# Patient Record
Sex: Male | Born: 1990
Health system: Southern US, Community
[De-identification: ages and names within clinical notes are randomized; demographics above are authoritative.]

## PROBLEM LIST (undated history)

## (undated) DIAGNOSIS — J45909 Unspecified asthma, uncomplicated: Secondary | ICD-10-CM

## (undated) DIAGNOSIS — R4586 Emotional lability: Secondary | ICD-10-CM

## (undated) DIAGNOSIS — L309 Dermatitis, unspecified: Secondary | ICD-10-CM

---
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
## 2016-06-21 ENCOUNTER — Encounter (HOSPITAL_COMMUNITY): Payer: Self-pay | Admitting: *Deleted

## 2016-06-21 ENCOUNTER — Emergency Department (HOSPITAL_COMMUNITY): Payer: Self-pay

## 2016-06-21 ENCOUNTER — Emergency Department (HOSPITAL_COMMUNITY)
Admission: EM | Admit: 2016-06-21 | Discharge: 2016-06-21 | Disposition: A | Discharge status: Home / Self Care | Payer: Self-pay | Attending: Emergency Medicine | Admitting: Emergency Medicine

## 2016-06-21 DIAGNOSIS — J45901 Unspecified asthma with (acute) exacerbation: Secondary | ICD-10-CM | POA: Insufficient documentation

## 2016-06-21 DIAGNOSIS — B9789 Other viral agents as the cause of diseases classified elsewhere: Secondary | ICD-10-CM

## 2016-06-21 DIAGNOSIS — J069 Acute upper respiratory infection, unspecified: Secondary | ICD-10-CM

## 2016-06-21 DIAGNOSIS — F17200 Nicotine dependence, unspecified, uncomplicated: Secondary | ICD-10-CM | POA: Insufficient documentation

## 2016-06-21 HISTORY — DX: Unspecified asthma, uncomplicated: J45.909

## 2016-06-21 MED ORDER — PREDNISONE 20 MG PO TABS
60.0000 mg | ORAL_TABLET | Freq: Once | ORAL | Status: AC
  Administered 2016-06-21: 60 mg via ORAL
  Filled 2016-06-21: qty 3

## 2016-06-21 MED ORDER — PREDNISONE 10 MG PO TABS: 60.0000 mg | ORAL_TABLET | Freq: Every day | ORAL | 0 refills | Status: AC

## 2016-06-21 MED ORDER — ALBUTEROL SULFATE HFA 108 (90 BASE) MCG/ACT IN AERS
4.0000 | INHALATION_SPRAY | Freq: Once | RESPIRATORY_TRACT | Status: AC
  Administered 2016-06-21: 4 via RESPIRATORY_TRACT
  Filled 2016-06-21: qty 13.4

## 2016-06-21 NOTE — ED Notes (Signed)
To x-ray

## 2016-06-21 NOTE — ED Provider Notes (Signed)
[facility] Provider Note   CSN: [id number] Arrival date & time: 06/21/16  1706     History   Chief Complaint Chief Complaint  Patient presents with  . Nasal Congestion    HPI Larry Simmons is a 26 y.o. male.  HPI  26 year old previously healthy male with history of asthma as a child who presents with cough and nasal congestion. Patient states that the symptoms have been occurring for the last 2 days. States he has a cough productive of yellow sputum. Denies fevers or chills. States he works in a [employer], and when he was at work today over the [employer] he felt lightheaded and had to sit down. Also endorses some wheezing, with occasional shortness of breath. He has not felt lightheaded at all since that time at work. Endorses a tightness in his chest but denies chest pain. No nausea, vomiting, or diarrhea. He has not used an inhaler "in years." Denies sore throat or rhinorrhea.   Past Medical History:  Diagnosis Date  . Asthma     There are no active problems to display for this patient.   History reviewed. No pertinent surgical history.     Home Medications    Prior to Admission medications   Medication Sig Start Date End Date Taking? Authorizing Provider  predniSONE (DELTASONE) 10 MG tablet Take 6 tablets (60 mg total) by mouth daily. 06/21/16 06/25/16  Margretta Zamorano Ernestina Penna, MD    Family History No family history on file.  Social History Social History  Substance Use Topics  . Smoking status: Current Every Day Smoker  . Smokeless tobacco: Current User  . Alcohol use Not on file     Allergies   Iodinated diagnostic agents   Review of Systems Review of Systems  Constitutional: Negative for chills and fever.  HENT: Positive for congestion. Negative for rhinorrhea and sore throat.   Eyes: Negative for visual disturbance.  Respiratory: Positive for cough, chest tightness, shortness of breath and wheezing.   Cardiovascular: Negative for chest pain and  palpitations.  Gastrointestinal: Negative for abdominal pain, diarrhea, nausea and vomiting.  Genitourinary: Negative for dysuria and frequency.  Musculoskeletal: Negative for arthralgias, back pain, myalgias, neck pain and neck stiffness.  Skin: Negative for color change and rash.  Neurological: Negative for dizziness, syncope and headaches.  Psychiatric/Behavioral: Negative for agitation, behavioral problems and confusion.     Physical Exam Updated Vital Signs BP 114/69   Pulse 66   Temp 97.8 F (36.6 C) (Oral)   Resp 16   Ht  (1.88 m)   Wt 72.6 kg   SpO2 96%   BMI 20.54 kg/m   Physical Exam  Constitutional: He is oriented to person, place, and time. He appears well-developed and well-nourished. No distress.  HENT:  Head: Normocephalic and atraumatic.  Nose: Nose normal.  Mouth/Throat: Oropharynx is clear and moist. No oropharyngeal exudate.  Eyes: Conjunctivae are normal.  Neck: Normal range of motion. Neck supple.  Cardiovascular: Normal rate, regular rhythm, normal heart sounds and intact distal pulses.   No murmur heard. Pulmonary/Chest: Effort normal. No respiratory distress. Wheezes: soft diffuse end-expiratory wheezing. He has no rales.  Abdominal: Soft. Bowel sounds are normal. He exhibits no distension. There is no tenderness. There is no guarding.  Musculoskeletal: He exhibits no edema.  Neurological: He is alert and oriented to person, place, and time. He exhibits normal muscle tone.  Skin: Skin is warm and dry. He is not diaphoretic.  Psychiatric: He has a  normal mood and affect.  Nursing note and vitals reviewed.    ED Treatments / Results  Labs (all labs ordered are listed, but only abnormal results are displayed) Labs Reviewed - No data to display  EKG  EKG Interpretation  Date/Time:  Sunday June 21 2016 19:43:30 EDT Ventricular Rate:  68 PR Interval:  130 QRS Duration: 88 QT Interval:  390 QTC Calculation: 414 R Axis:   72 Text  Interpretation:  Normal sinus rhythm RSR' or QR pattern in V1 suggests right ventricular conduction delay Borderline ECG No significant change since last tracing Confirmed by [facility] MD, ERIN ([id number]) on 06/21/2016 9:35:04 PM       Radiology Dg Chest 2 View  Result Date: 06/21/2016 CLINICAL DATA:  Chest pain, dyspnea and cough since Friday. Smoker x6 years. EXAM: CHEST  2 VIEW COMPARISON:  None. FINDINGS: The heart size and mediastinal contours are within normal limits. Both lungs are clear. The visualized skeletal structures are unremarkable. IMPRESSION: No active cardiopulmonary disease. Electronically Signed   By: Tollie Eth M.D.   On: 06/21/2016 20:40    Procedures Procedures (including critical care time)  Medications Ordered in ED Medications  albuterol (PROVENTIL HFA;VENTOLIN HFA) 108 (90 Base) MCG/ACT inhaler 4 puff (4 puffs Inhalation Given 06/21/16 1948)  predniSONE (DELTASONE) tablet 60 mg (60 mg Oral Given 06/21/16 1948)     Initial Impression / Assessment and Plan / ED Course  I have reviewed the triage vital signs and the nursing notes.  Pertinent labs & imaging results that were available during my care of the patient were reviewed by me and considered in my medical decision making (see chart for details).     Patient is well-appearing. Afebrile and hemodynamically stable. Patient has wheezing on lung exam. He was given 4 puffs of albuterol with improvement in wheezing and the tightness in his chest. Chest x-ray with no active cardiopulmonary disease. EKG not suggestive of ischemia. Suspect upper respiratory infection causing mild asthma exacerbation. We will discharge with albuterol inhaler as well as 4 additional doses of prednisone (first dose given in the ED). Patient discharged in good condition.  Care of patient overseen by my attending, Dr. Dalene Seltzer.  Final Clinical Impressions(s) / ED Diagnoses   Final diagnoses:  Mild asthma with exacerbation, unspecified  whether persistent  Viral URI with cough    New Prescriptions Discharge Medication List as of 06/21/2016  9:46 PM    START taking these medications   Details  predniSONE (DELTASONE) 10 MG tablet Take 6 tablets (60 mg total) by mouth daily., Starting Sun 06/21/2016, Until Thu 06/25/2016, Print         Delando Satter Ernestina Penna, MD 06/22/16 9147    Alvira Monday, MD 06/24/16 1640

## 2016-06-21 NOTE — ED Triage Notes (Signed)
Pt reports cough, congestion and feeling "tired" for 2 days.

## 2016-06-21 NOTE — ED Notes (Signed)
Pt states he will need a work note for leaving work today.

## 2016-06-21 NOTE — ED Notes (Signed)
Patient denies pain and is resting comfortably.  
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
## 2016-11-10 ENCOUNTER — Encounter (HOSPITAL_COMMUNITY): Payer: Self-pay

## 2016-11-10 ENCOUNTER — Emergency Department (HOSPITAL_COMMUNITY)
Admission: EM | Admit: 2016-11-10 | Discharge: 2016-11-10 | Disposition: A | Discharge status: Home / Self Care | Payer: Self-pay | Attending: Emergency Medicine | Admitting: Emergency Medicine

## 2016-11-10 ENCOUNTER — Emergency Department (HOSPITAL_COMMUNITY): Payer: Self-pay

## 2016-11-10 DIAGNOSIS — Y998 Other external cause status: Secondary | ICD-10-CM | POA: Insufficient documentation

## 2016-11-10 DIAGNOSIS — L309 Dermatitis, unspecified: Secondary | ICD-10-CM

## 2016-11-10 DIAGNOSIS — W228XXA Striking against or struck by other objects, initial encounter: Secondary | ICD-10-CM | POA: Insufficient documentation

## 2016-11-10 DIAGNOSIS — S62326A Displaced fracture of shaft of fifth metacarpal bone, right hand, initial encounter for closed fracture: Secondary | ICD-10-CM | POA: Insufficient documentation

## 2016-11-10 DIAGNOSIS — Y939 Activity, unspecified: Secondary | ICD-10-CM | POA: Insufficient documentation

## 2016-11-10 DIAGNOSIS — Y929 Unspecified place or not applicable: Secondary | ICD-10-CM | POA: Insufficient documentation

## 2016-11-10 DIAGNOSIS — J45909 Unspecified asthma, uncomplicated: Secondary | ICD-10-CM | POA: Insufficient documentation

## 2016-11-10 DIAGNOSIS — L259 Unspecified contact dermatitis, unspecified cause: Secondary | ICD-10-CM | POA: Insufficient documentation

## 2016-11-10 HISTORY — DX: Dermatitis, unspecified: L30.9

## 2016-11-10 MED ORDER — TRIAMCINOLONE ACETONIDE 0.1 % EX CREA: 1.0000 "application " | TOPICAL_CREAM | Freq: Two times a day (BID) | CUTANEOUS | 0 refills | Status: DC

## 2016-11-10 MED ORDER — HYDROCODONE-ACETAMINOPHEN 5-325 MG PO TABS: 1.0000 | ORAL_TABLET | Freq: Four times a day (QID) | ORAL | 0 refills | Status: DC | PRN

## 2016-11-10 MED ORDER — IBUPROFEN 200 MG PO TABS
600.0000 mg | ORAL_TABLET | Freq: Once | ORAL | Status: AC
  Administered 2016-11-10: 600 mg via ORAL
  Filled 2016-11-10: qty 1
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
## 2016-11-10 NOTE — ED Notes (Signed)
Patient transported to X-ray 

## 2016-11-10 NOTE — ED Notes (Signed)
Ortho tech in. 

## 2016-11-10 NOTE — Discharge Instructions (Signed)
For hand:  Ice - ice for 20 minutes at a time, several times a day Elevate - elevate arm as much as possible Ibuprofen - take with food. Take up to 3-4 times daily Take narcotic pain medicine for severe pain. Do not drink alcohol or drive while taking this medicine  For eczema:  Use Triamcinolone twice a day Follow up with dermatology Avoid hot showers Continue lotions
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
## 2016-11-10 NOTE — Progress Notes (Signed)
Orthopedic Tech Progress Note Patient Details:  Larry Simmons 05-Feb-1991 [id number]  Ortho Devices Type of Ortho Device: Ace wrap, Ulna gutter splint Ortho Device/Splint Interventions: Application   Saul Fordyce 11/10/2016, 2:52 PM
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
## 2016-11-10 NOTE — ED Notes (Signed)
Ortho tech responded. 

## 2016-11-10 NOTE — ED Notes (Signed)
Ortho tech paged  

## 2016-11-19 ENCOUNTER — Ambulatory Visit: Payer: Self-pay | Attending: Internal Medicine | Admitting: Physician Assistant

## 2016-11-19 ENCOUNTER — Encounter: Payer: Self-pay | Admitting: Physician Assistant

## 2016-11-19 VITALS — BP 102/55 | HR 64 | Temp 98.4°F | Resp 18 | Ht 74.0 in | Wt 163.6 lb

## 2016-11-19 DIAGNOSIS — F17200 Nicotine dependence, unspecified, uncomplicated: Secondary | ICD-10-CM

## 2016-11-19 DIAGNOSIS — IMO0001 Reserved for inherently not codable concepts without codable children: Secondary | ICD-10-CM

## 2016-11-19 DIAGNOSIS — Z131 Encounter for screening for diabetes mellitus: Secondary | ICD-10-CM

## 2016-11-19 DIAGNOSIS — J45909 Unspecified asthma, uncomplicated: Secondary | ICD-10-CM | POA: Insufficient documentation

## 2016-11-19 DIAGNOSIS — Z720 Tobacco use: Secondary | ICD-10-CM | POA: Insufficient documentation

## 2016-11-19 DIAGNOSIS — S62306A Unspecified fracture of fifth metacarpal bone, right hand, initial encounter for closed fracture: Secondary | ICD-10-CM

## 2016-11-19 DIAGNOSIS — W2209XA Striking against other stationary object, initial encounter: Secondary | ICD-10-CM | POA: Insufficient documentation

## 2016-11-19 DIAGNOSIS — L309 Dermatitis, unspecified: Secondary | ICD-10-CM

## 2016-11-19 LAB — POCT GLYCOSYLATED HEMOGLOBIN (HGB A1C): HEMOGLOBIN A1C: 5.1

## 2016-11-19 LAB — GLUCOSE, POCT (MANUAL RESULT ENTRY): POC GLUCOSE: 102 mg/dL — AB (ref 70–99)

## 2016-11-19 MED ORDER — TRIAMCINOLONE ACETONIDE 0.1 % EX CREA: 1.0000 "application " | TOPICAL_CREAM | Freq: Two times a day (BID) | CUTANEOUS | 5 refills | Status: DC

## 2016-11-19 MED FILL — TRIAMCINOLONE 0.1% CREAM: 0.1 | 15 days supply | Qty: 30 | Fill #0
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
## 2016-11-19 NOTE — Patient Instructions (Signed)

## 2017-01-12 ENCOUNTER — Ambulatory Visit: Payer: Self-pay | Admitting: Internal Medicine

## 2017-09-17 ENCOUNTER — Encounter (HOSPITAL_COMMUNITY): Payer: Self-pay | Admitting: *Deleted

## 2017-09-17 ENCOUNTER — Emergency Department (HOSPITAL_COMMUNITY): Payer: Self-pay

## 2017-09-17 ENCOUNTER — Emergency Department (HOSPITAL_COMMUNITY)
Admission: EM | Admit: 2017-09-17 | Discharge: 2017-09-17 | Disposition: A | Discharge status: Home / Self Care | Payer: Self-pay | Attending: Emergency Medicine | Admitting: Emergency Medicine

## 2017-09-17 ENCOUNTER — Other Ambulatory Visit: Payer: Self-pay

## 2017-09-17 DIAGNOSIS — F17210 Nicotine dependence, cigarettes, uncomplicated: Secondary | ICD-10-CM | POA: Insufficient documentation

## 2017-09-17 DIAGNOSIS — J45909 Unspecified asthma, uncomplicated: Secondary | ICD-10-CM | POA: Insufficient documentation

## 2017-09-17 DIAGNOSIS — M549 Dorsalgia, unspecified: Secondary | ICD-10-CM | POA: Insufficient documentation

## 2017-09-17 MED ORDER — NAPROXEN 250 MG PO TABS
500.0000 mg | ORAL_TABLET | Freq: Two times a day (BID) | ORAL | Status: DC
  Administered 2017-09-17: 500 mg via ORAL
  Filled 2017-09-17: qty 2

## 2017-09-17 MED ORDER — ACETAMINOPHEN 500 MG PO TABS
1000.0000 mg | ORAL_TABLET | Freq: Once | ORAL | Status: AC
  Administered 2017-09-17: 1000 mg via ORAL
  Filled 2017-09-17: qty 2

## 2017-09-17 MED ORDER — NAPROXEN 375 MG PO TABS: 375.0000 mg | ORAL_TABLET | Freq: Two times a day (BID) | ORAL | 0 refills | Status: DC

## 2017-09-17 NOTE — ED Notes (Signed)
Patient transported to X-ray 
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
## 2018-02-03 ENCOUNTER — Encounter (HOSPITAL_COMMUNITY): Payer: Self-pay

## 2018-02-03 ENCOUNTER — Emergency Department (HOSPITAL_COMMUNITY)
Admission: EM | Admit: 2018-02-03 | Discharge: 2018-02-03 | Disposition: A | Discharge status: Home / Self Care | Payer: Worker's Compensation | Attending: Emergency Medicine | Admitting: Emergency Medicine

## 2018-02-03 ENCOUNTER — Emergency Department (HOSPITAL_COMMUNITY): Payer: Worker's Compensation

## 2018-02-03 DIAGNOSIS — L309 Dermatitis, unspecified: Secondary | ICD-10-CM

## 2018-02-03 DIAGNOSIS — Y939 Activity, unspecified: Secondary | ICD-10-CM | POA: Insufficient documentation

## 2018-02-03 DIAGNOSIS — F17210 Nicotine dependence, cigarettes, uncomplicated: Secondary | ICD-10-CM | POA: Diagnosis not present

## 2018-02-03 DIAGNOSIS — L259 Unspecified contact dermatitis, unspecified cause: Secondary | ICD-10-CM | POA: Diagnosis not present

## 2018-02-03 DIAGNOSIS — S63610A Unspecified sprain of right index finger, initial encounter: Secondary | ICD-10-CM | POA: Insufficient documentation

## 2018-02-03 DIAGNOSIS — J45909 Unspecified asthma, uncomplicated: Secondary | ICD-10-CM | POA: Diagnosis not present

## 2018-02-03 DIAGNOSIS — Z79899 Other long term (current) drug therapy: Secondary | ICD-10-CM | POA: Diagnosis not present

## 2018-02-03 DIAGNOSIS — Y990 Civilian activity done for income or pay: Secondary | ICD-10-CM | POA: Insufficient documentation

## 2018-02-03 DIAGNOSIS — Y929 Unspecified place or not applicable: Secondary | ICD-10-CM | POA: Diagnosis not present

## 2018-02-03 DIAGNOSIS — X501XXA Overexertion from prolonged static or awkward postures, initial encounter: Secondary | ICD-10-CM | POA: Insufficient documentation

## 2018-02-03 MED ORDER — PREDNISONE 20 MG PO TABS: 60.0000 mg | ORAL_TABLET | Freq: Every day | ORAL | 0 refills | Status: AC

## 2018-02-03 MED ORDER — NAPROXEN 500 MG PO TABS: 500.0000 mg | ORAL_TABLET | Freq: Two times a day (BID) | ORAL | 0 refills | Status: DC

## 2018-02-03 MED ORDER — IBUPROFEN 400 MG PO TABS
600.0000 mg | ORAL_TABLET | Freq: Once | ORAL | Status: AC
  Administered 2018-02-03: 14:00:00 600 mg via ORAL
  Filled 2018-02-03: qty 1

## 2018-02-03 MED ORDER — TRIAMCINOLONE ACETONIDE 0.1 % EX CREA: 1.0000 "application " | TOPICAL_CREAM | Freq: Two times a day (BID) | CUTANEOUS | 0 refills | Status: DC

## 2018-02-03 NOTE — ED Triage Notes (Signed)
Pt presents with pain to R hand from injury on Saturday.  Pt reports using a machine at work and jammed middle finger.

## 2018-02-03 NOTE — ED Notes (Signed)
Pt transported to radiology via wheelchair.

## 2018-02-03 NOTE — ED Provider Notes (Signed)
[facility] EMERGENCY DEPARTMENT Provider Note   CSN: [id number] Arrival date & time: 02/03/18  1327     History   Chief Complaint Chief Complaint  Patient presents with  . Hand Injury    HPI Larry Simmons is a 27 y.o. male.  Larry Simmons is a 27 y.o. Male with a history of eczema and asthma, presents to the emergency department for evaluation of hand injury.  He reports while at work on Saturday he was using a machine and seemed to jam and hyperextend his index and middle finger.  He reports pain and swelling primarily over the index finger and in between the second and third fingers since then.  He has not tried any thing to treat his symptoms.  Reports pain is mild, but worse with movement and palpation but he is able to bend and extend the fingers.  He denies any numbness tingling or weakness.  No discoloration to the fingers.  No prior surgery or injury to the hand.  Pt is also noted to be having a severe eczema flare.     Past Medical History:  Diagnosis Date  . Asthma   . Eczema     There are no active problems to display for this patient.   History reviewed. No pertinent surgical history.      Home Medications    Prior to Admission medications   Medication Sig Start Date End Date Taking? Authorizing Provider  HYDROcodone-acetaminophen (NORCO/VICODIN) 5-325 MG tablet Take 1-2 tablets by mouth every 6 (six) hours as needed for severe pain. 11/10/16   Bethel Born, PA-C  naproxen (NAPROSYN) 500 MG tablet Take 1 tablet (500 mg total) by mouth 2 (two) times daily. 02/03/18   Dartha Lodge, PA-C  predniSONE (DELTASONE) 20 MG tablet Take 3 tablets (60 mg total) by mouth daily for 5 days. 02/03/18 02/08/18  Dartha Lodge, PA-C  triamcinolone cream (KENALOG) 0.1 % Apply 1 application topically 2 (two) times daily. 02/03/18   Dartha Lodge, PA-C    Family History History reviewed. No pertinent family history.  Social History Social  History   Tobacco Use  . Smoking status: Current Every Day Smoker    Packs/day: 0.25    Types: Cigarettes  . Smokeless tobacco: Never Used  Substance Use Topics  . Alcohol use: Yes    Comment: occ  . Drug use: Yes    Types: Marijuana     Allergies   Iodinated diagnostic agents   Review of Systems Review of Systems  Constitutional: Negative for chills and fever.  Musculoskeletal: Positive for arthralgias and joint swelling.  Skin: Positive for rash.  Neurological: Negative for weakness and numbness.     Physical Exam Updated Vital Signs BP 110/71   Pulse 66   Temp (!) 97.2 F (36.2 C)   Resp 18   Ht 6' (1.829 m)   Wt 77.1 kg   SpO2 100%   BMI 23.06 kg/m   Physical Exam  Constitutional: He appears well-developed and well-nourished. No distress.  HENT:  Head: Normocephalic and atraumatic.  Eyes: Right eye exhibits no discharge. Left eye exhibits no discharge.  Pulmonary/Chest: Effort normal. No respiratory distress.  Musculoskeletal:  Tenderness to palpation with some soft tissue swelling over the right index finger and MCP joint without palpable bony deformity patient is able to flex and extend the finger against resistance at all joints.  There is no overlying discoloration.  No tenderness over any of the  other fingers, there is some tenderness in between the second and third finger.  Good capillary refill and normal sensation, 2+ radial pulse.  Neurological: He is alert. Coordination normal.  Skin: Skin is warm and dry. Rash noted. He is not diaphoretic.  Extensive eczema over bilateral upper extremities with some cracking of the skin but no overlying erythema or indurations to suggest superimposed infection  Psychiatric: He has a normal mood and affect. His behavior is normal.  Nursing note and vitals reviewed.    ED Treatments / Results  Labs (all labs ordered are listed, but only abnormal results are displayed) Labs Reviewed - No data to  display  EKG None  Radiology Dg Hand Complete Right  Result Date: 02/03/2018 CLINICAL DATA:  RIGHT hand smashed in machine at work a few days ago, persistent pain at base of RIGHT index and middle fingers EXAM: RIGHT HAND - COMPLETE 3+ VIEW COMPARISON:  11/10/2016 FINDINGS: Osseous mineralization normal. Joint spaces preserved. Old healed fifth metacarpal fracture with resultant mild deformity. Soft tissue swelling at proximal phalanx RIGHT index finger. No acute fracture, dislocation, or bone destruction. IMPRESSION: No acute osseous abnormalities. Old healed fracture of the RIGHT fifth metacarpal diaphysis. Electronically Signed   By: Ulyses SouthwardMark  Boles M.D.   On: 02/03/2018 14:40    Procedures Procedures (including critical care time)  Medications Ordered in ED Medications  ibuprofen (ADVIL,MOTRIN) tablet 600 mg (600 mg Oral Given 02/03/18 1405)     Initial Impression / Assessment and Plan / ED Course  I have reviewed the triage vital signs and the nursing notes.  Pertinent labs & imaging results that were available during my care of the patient were reviewed by me and considered in my medical decision making (see chart for details).  Patient X-Ray negative for obvious fracture or dislocation, likely finger sprain. Pain managed in ED. Pt advised to follow up with orthopedics if symptoms persist for possibility of missed fracture diagnosis. Patient given finger splint while in ED, conservative therapy recommended and discussed.  Also with severe eczema will provide short course of systemic steroids and refill patient's triamcinolone as he reports he has been out of it and unable to get into see a regular doctor, dermatology referral provided.  No signs of superimposed infection over eczema flare.  Patient will be dc home & is agreeable with above plan.   Final Clinical Impressions(s) / ED Diagnoses   Final diagnoses:  Sprain of right index finger, unspecified site of finger, initial  encounter  Eczema, unspecified type    ED Discharge Orders         Ordered    naproxen (NAPROSYN) 500 MG tablet  2 times daily     02/03/18 1459    triamcinolone cream (KENALOG) 0.1 %  2 times daily     02/03/18 1459    predniSONE (DELTASONE) 20 MG tablet  Daily     02/03/18 1459           Dartha LodgeFord, Ephrem Carrick N, [place]PA-C 02/03/18 1559    Pricilla LovelessGoldston, Scott, MD 02/04/18 1123

## 2018-02-03 NOTE — Discharge Instructions (Signed)
Your x-ray shows no evidence of fracture I think you likely have a sprain of the finger, you can use a splint to help support the finger and improve pain as well as Naprosyn, Tylenol, ice and elevation.  You can use oral steroids and steroid cream to help calm down this exacerbation, but you will need to follow-up with a dermatologist for further evaluation of your severe eczema.  If you develop areas of redness, swelling or drainage from the cracks in your skin from your eczema these are signs of infection and you should return for reevaluation.

## 2018-02-03 NOTE — ED Notes (Signed)
Pt returned to room from radiology, tolerated well.

## 2019-06-29 ENCOUNTER — Ambulatory Visit: Payer: Medicaid Other | Attending: Internal Medicine

## 2019-07-01 ENCOUNTER — Ambulatory Visit: Payer: Medicaid Other | Attending: Internal Medicine

## 2019-07-01 DIAGNOSIS — Z23 Encounter for immunization: Secondary | ICD-10-CM

## 2019-07-01 NOTE — Progress Notes (Signed)
   Covid-19 Vaccination Clinic  Name:  TRUSTEN HUME    MRN: [id number] DOB: Jan 08, 1991  07/01/2019  Mr. Paget was observed post Covid-19 immunization for 15 minutes without incident. He was provided with Vaccine Information Sheet and instruction to access the V-Safe system.   Mr. Bounds was instructed to call 911 with any severe reactions post vaccine: Marland Kitchen Difficulty breathing  . Swelling of face and throat  . A fast heartbeat  . A bad rash all over body  . Dizziness and weakness   Immunizations Administered    Name Date Dose VIS Date Route   Pfizer COVID-19 Vaccine 07/01/2019  3:13 PM 0.3 mL 03/03/2019 Intramuscular   Manufacturer: [employer], [employer]   Lot: [id number]   NDC: [address]-0

## 2019-07-24 ENCOUNTER — Ambulatory Visit: Payer: Medicaid Other | Attending: Internal Medicine

## 2019-07-24 DIAGNOSIS — Z23 Encounter for immunization: Secondary | ICD-10-CM

## 2019-07-24 NOTE — Progress Notes (Signed)
   Covid-19 Vaccination Clinic  Name:  Larry Simmons    MRN: [id number] DOB: [birth year]  07/24/2019  [name] was observed post Covid-19 immunization for 15 minutes without incident. He was provided with Vaccine Information Sheet and instruction to access the V-Safe system.   [name] was instructed to call 911 with any severe reactions post vaccine: Marland Kitchen Difficulty breathing  . Swelling of face and throat  . A fast heartbeat  . A bad rash all over body  . Dizziness and weakness   Immunizations Administered    Name Date Dose VIS Date Route   Pfizer COVID-19 Vaccine 07/24/2019  1:41 PM 0.3 mL 05/17/2018 Intramuscular   Manufacturer: [employer], [employer]   Lot: [id number]   NDC: [address]-6

## 2020-01-04 ENCOUNTER — Ambulatory Visit (HOSPITAL_COMMUNITY)
Admission: EM | Admit: 2020-01-04 | Discharge: 2020-01-04 | Disposition: A | Discharge status: Home / Self Care | Payer: No Payment, Other | Attending: Psychiatry | Admitting: Psychiatry

## 2020-01-04 ENCOUNTER — Other Ambulatory Visit: Payer: Self-pay

## 2020-01-04 DIAGNOSIS — G4700 Insomnia, unspecified: Secondary | ICD-10-CM | POA: Insufficient documentation

## 2020-01-04 DIAGNOSIS — F17210 Nicotine dependence, cigarettes, uncomplicated: Secondary | ICD-10-CM | POA: Diagnosis not present

## 2020-01-04 DIAGNOSIS — F22 Delusional disorders: Secondary | ICD-10-CM | POA: Insufficient documentation

## 2020-01-04 DIAGNOSIS — F411 Generalized anxiety disorder: Secondary | ICD-10-CM | POA: Diagnosis not present

## 2020-01-04 DIAGNOSIS — R450 Nervousness: Secondary | ICD-10-CM | POA: Insufficient documentation

## 2020-01-04 NOTE — ED Notes (Signed)
Pt belongings in locker #7.
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
## 2020-01-04 NOTE — Discharge Instructions (Addendum)
   Discharge recommendations:   Please see information for follow-up appointment with psychiatry and therapy. Please follow up with your primary care provider for all medical related needs.   Therapy: We recommend that patient participate in individual therapy to address mental health concerns.   Safety:  The patient should abstain from use of illicit substances/drugs and abuse of any medications. If symptoms worsen or do not continue to improve or if the patient becomes actively suicidal or homicidal then it is recommended that the patient return to the closet hospital emergency department, the [facility], or call 911 for further evaluation and treatment. National Suicide Prevention Lifeline 1-800-SUICIDE or (270)294-2904.

## 2020-01-04 NOTE — ED Provider Notes (Signed)
Behavioral Health Urgent Care Medical Screening Exam  Patient Name: Larry Simmons MRN: [id number] Date of Evaluation: 01/04/20 Chief Complaint:   Diagnosis:  Final diagnoses:  Generalized anxiety disorder    History of Present illness: Larry Simmons is a 29 y.o. male who presents to [facility] due to anxiety. On evaluation patient is alert and oriented x 4, pleasant, and cooperative. Speech is clear and coherent. He is very talkative, but speech is not pressured or tangential. Mood is anxious and affect is congruent with mood. Thought process is coherent and linear. Denies auditory and visual hallucinations. No indication that patient is responding to internal stimuli. Patient reports that he contacted police for a safe escort home because he felt like someone from work was following him home. He acknowledges that he has some paranoia related to feeling like his coworkers are out to get him. He states that he feels that his childhood and past trauma has triggered these feelings. Denies suicidal ideations. Denies homicidal ideations. He reports daily use of marijuana. Reports occasional use of alcohol. States that he does smoke cigarettes at times. Denies use of other substances. Patient reports that he feels that some of his coworkers are  Psychiatric Specialty Exam  Presentation  General Appearance:Appropriate for Environment;Neat  Eye Contact:Good  Speech:Clear and Coherent;Normal Rate  Speech Volume:Normal  Handedness:Right   Mood and Affect  Mood:Anxious  Affect:Congruent   Thought Process  Thought Processes:Coherent;Linear;Goal Directed  Descriptions of Associations:Intact  Orientation:No data recorded Thought Content:Other (comment) (some paranoia that his coworkers are out to get him)  Hallucinations:None  Ideas of Reference:None  Suicidal Thoughts:No  Homicidal Thoughts:No   Sensorium  Memory:Immediate Good;Recent Good;Remote  Good  Judgment:Fair  Insight:Fair   Executive Functions  Concentration:Fair  Attention Span:Fair  Recall:Good  Fund of Knowledge:Good  Language:Good   Psychomotor Activity  Psychomotor Activity:Normal   Assets  Assets:Communication Skills;Desire for Improvement;Social Support;Physical Health;Housing;Leisure Time;Transportation   Sleep  Sleep:Fair  Number of hours: No data recorded  Physical Exam: Physical Exam Constitutional:      General: He is not in acute distress.    Appearance: He is not ill-appearing, toxic-appearing or diaphoretic.  HENT:     Head: Normocephalic.     Right Ear: External ear normal.     Left Ear: External ear normal.  Eyes:     Pupils: Pupils are equal, round, and reactive to light.  Cardiovascular:     Rate and Rhythm: Normal rate.  Pulmonary:     Effort: Pulmonary effort is normal. No respiratory distress.  Musculoskeletal:        General: Normal range of motion.  Skin:    General: Skin is warm and dry.     Comments: Eczema bilateral hands  Neurological:     Mental Status: He is alert and oriented to person, place, and time.  Psychiatric:        Mood and Affect: Mood is anxious.        Speech: Speech normal.        Behavior: Behavior is cooperative.        Thought Content: Thought content is not paranoid or delusional. Thought content does not include homicidal or suicidal ideation. Thought content does not include suicidal plan.    Review of Systems  Constitutional: Negative for chills, diaphoresis, fever, malaise/fatigue and weight loss.  HENT: Negative for congestion.   Respiratory: Negative for cough and shortness of breath.   Cardiovascular: Negative for chest pain and palpitations.  Gastrointestinal: Negative for diarrhea,  nausea and vomiting.  Neurological: Negative for dizziness and seizures.  Psychiatric/Behavioral: Negative for depression, hallucinations, memory loss, substance abuse and suicidal ideas. The patient  is nervous/anxious and has insomnia.   All other systems reviewed and are negative.  Blood pressure 130/74, pulse (!) 58, temperature 98.7 F (37.1 C), temperature source Oral, resp. rate 20, SpO2 100 %. There is no height or weight on file to calculate BMI.  Musculoskeletal: Strength & Muscle Tone: within normal limits Gait & Station: normal Patient leans: N/A   [facility] MSE Discharge Disposition for Follow up and Recommendations: Based on my evaluation the patient does not appear to have an emergency medical condition and can be discharged with resources and follow up care in outpatient services for Individual Therapy   Disposition: No evidence of imminent risk to self or others at present.   Patient does not meet criteria for psychiatric inpatient admission. Supportive therapy provided about ongoing stressors. Discussed crisis plan, support from social network, calling 911, coming to the Emergency Department, and calling Suicide Hotline.  [facility] [address] [place] [address] [phone] Call  As needed    Jackelyn Poling, NP 01/04/2020, 4:55 AM

## 2020-02-10 ENCOUNTER — Encounter (HOSPITAL_COMMUNITY): Payer: Self-pay

## 2020-02-10 ENCOUNTER — Other Ambulatory Visit: Payer: Self-pay

## 2020-02-10 ENCOUNTER — Ambulatory Visit (HOSPITAL_COMMUNITY)
Admission: EM | Admit: 2020-02-10 | Discharge: 2020-02-11 | Disposition: A | Discharge status: Home / Self Care | Payer: No Payment, Other | Attending: Family | Admitting: Family

## 2020-02-10 DIAGNOSIS — Z20822 Contact with and (suspected) exposure to covid-19: Secondary | ICD-10-CM | POA: Diagnosis not present

## 2020-02-10 DIAGNOSIS — F22 Delusional disorders: Secondary | ICD-10-CM | POA: Diagnosis not present

## 2020-02-10 DIAGNOSIS — F17200 Nicotine dependence, unspecified, uncomplicated: Secondary | ICD-10-CM | POA: Insufficient documentation

## 2020-02-10 LAB — COMPREHENSIVE METABOLIC PANEL
ALT: 14 U/L (ref 0–44)
AST: 25 U/L (ref 15–41)
Albumin: 4.2 g/dL (ref 3.5–5.0)
Alkaline Phosphatase: 51 U/L (ref 38–126)
Anion gap: 13 (ref 5–15)
BUN: 11 mg/dL (ref 6–20)
CO2: 23 mmol/L (ref 22–32)
Calcium: 9.6 mg/dL (ref 8.9–10.3)
Chloride: 104 mmol/L (ref 98–111)
Creatinine, Ser: 0.97 mg/dL (ref 0.61–1.24)
GFR, Estimated: >60 mL/min (ref >60)
Glucose, Bld: 92 mg/dL (ref 70–99)
Potassium: 3.8 mmol/L (ref 3.5–5.1)
Sodium: 140 mmol/L (ref 135–145)
Total Bilirubin: 1 mg/dL (ref 0.3–1.2)
Total Protein: 7.7 g/dL (ref 6.5–8.1)

## 2020-02-10 LAB — CBC WITH DIFFERENTIAL/PLATELET
Abs Immature Granulocytes: 0.01 10*3/uL (ref 0.00–0.07)
Basophils Absolute: 0 10*3/uL (ref 0.0–0.1)
Basophils Relative: 1 %
Eosinophils Absolute: 0.4 10*3/uL (ref 0.0–0.5)
Eosinophils Relative: 13 %
HCT: 43.3 % (ref 39.0–52.0)
Hemoglobin: 14.4 g/dL (ref 13.0–17.0)
Immature Granulocytes: 0 %
Lymphocytes Relative: 19 %
Lymphs Abs: 0.6 10*3/uL — ABNORMAL LOW (ref 0.7–4.0)
MCH: 30.1 pg (ref 26.0–34.0)
MCHC: 33.3 g/dL (ref 30.0–36.0)
MCV: 90.4 fL (ref 80.0–100.0)
Monocytes Absolute: 0.4 10*3/uL (ref 0.1–1.0)
Monocytes Relative: 12 %
Neutro Abs: 1.9 10*3/uL (ref 1.7–7.7)
Neutrophils Relative %: 55 %
Platelets: 243 10*3/uL (ref 150–400)
RBC: 4.79 MIL/uL (ref 4.22–5.81)
RDW: 12.5 % (ref 11.5–15.5)
WBC: 3.5 10*3/uL — ABNORMAL LOW (ref 4.0–10.5)
nRBC: 0 % (ref 0.0–0.2)

## 2020-02-10 LAB — MAGNESIUM: Magnesium: 2.2 mg/dL (ref 1.7–2.4)

## 2020-02-10 LAB — LIPID PANEL
Cholesterol: 173 mg/dL (ref 0–200)
HDL: 95 mg/dL
LDL Cholesterol: 68 mg/dL (ref 0–99)
Total CHOL/HDL Ratio: 1.8 ratio
Triglycerides: 48 mg/dL
VLDL: 10 mg/dL (ref 0–40)

## 2020-02-10 LAB — HEMOGLOBIN A1C
Hgb A1c MFr Bld: 5.4 % (ref 4.8–5.6)
Mean Plasma Glucose: 108.28 mg/dL

## 2020-02-10 LAB — RESPIRATORY PANEL BY RT PCR (FLU A&B, COVID)
Influenza A by PCR: NEGATIVE
Influenza B by PCR: NEGATIVE
SARS Coronavirus 2 by RT PCR: NEGATIVE

## 2020-02-10 LAB — POCT URINE DRUG SCREEN - MANUAL ENTRY (I-SCREEN)
POC Amphetamine UR: NOT DETECTED
POC Buprenorphine (BUP): NOT DETECTED
POC Cocaine UR: NOT DETECTED
POC Marijuana UR: POSITIVE — AB
POC Methadone UR: NOT DETECTED
POC Methamphetamine UR: NOT DETECTED
POC Morphine: NOT DETECTED
POC Oxazepam (BZO): NOT DETECTED
POC Oxycodone UR: NOT DETECTED
POC Secobarbital (BAR): NOT DETECTED

## 2020-02-10 LAB — ETHANOL: Alcohol, Ethyl (B): <10 mg/dL

## 2020-02-10 LAB — POC SARS CORONAVIRUS 2 AG: SARS Coronavirus 2 Ag: NEGATIVE

## 2020-02-10 LAB — TSH: TSH: 3.936 u[IU]/mL (ref 0.350–4.500)

## 2020-02-10 MED ORDER — ALUM & MAG HYDROXIDE-SIMETH 200-200-20 MG/5ML PO SUSP: 30.0000 mL | ORAL | Status: DC | PRN

## 2020-02-10 MED ORDER — ACETAMINOPHEN 325 MG PO TABS: 650.0000 mg | ORAL_TABLET | Freq: Four times a day (QID) | ORAL | Status: DC | PRN

## 2020-02-10 MED ORDER — MAGNESIUM HYDROXIDE 400 MG/5ML PO SUSP
30.0000 mL | Freq: Every day | ORAL | Status: DC | PRN
Start: 2020-02-10 — End: 2020-02-11

## 2020-02-10 MED ORDER — TRAZODONE HCL 50 MG PO TABS
50.0000 mg | ORAL_TABLET | Freq: Every evening | ORAL | Status: DC | PRN
  Administered 2020-02-10: 50 mg via ORAL
  Filled 2020-02-10: qty 1

## 2020-02-10 MED ORDER — HYDROXYZINE HCL 25 MG PO TABS: 25.0000 mg | ORAL_TABLET | Freq: Three times a day (TID) | ORAL | Status: DC | PRN

## 2020-02-10 MED ORDER — OLANZAPINE 2.5 MG PO TABS
2.5000 mg | ORAL_TABLET | Freq: Two times a day (BID) | ORAL | Status: DC
  Administered 2020-02-10 – 2020-02-11 (×3): 2.5 mg via ORAL
  Filled 2020-02-10 (×3): qty 1

## 2020-02-10 NOTE — ED Notes (Signed)
LOCKER # 3
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
## 2020-02-10 NOTE — ED Notes (Signed)
Pt admitted for paranoia that others at work are after him and talking about him. Pt accompanied by sister. Cooperative throughout assessment. Denies SI/HI. Denies AVH. Pt presents with leathery, scaled skin due to eczema all over body. Denies pain/discomfort. Pt took recommended medication without difficulty (Xyprexa). Will monitor for safety.

## 2020-02-10 NOTE — ED Notes (Signed)
Pt belongings in locker 3.  
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
## 2020-02-10 NOTE — ED Notes (Signed)
Pt is sleeping@this time. Breathing even and unlabored. Will continue to monitor for safety 

## 2020-02-11 MED ORDER — TRAZODONE HCL 50 MG PO TABS
50.0000 mg | ORAL_TABLET | Freq: Every evening | ORAL | 0 refills | Status: DC | PRN
Start: 2020-02-11 — End: 2021-09-06

## 2020-02-11 MED ORDER — OLANZAPINE 2.5 MG PO TABS
2.5000 mg | ORAL_TABLET | Freq: Two times a day (BID) | ORAL | 0 refills | Status: DC
Start: 2020-02-11 — End: 2021-09-06

## 2020-02-11 NOTE — ED Notes (Signed)
MEAL GIVEN 

## 2020-02-11 NOTE — Discharge Instructions (Addendum)

## 2020-02-11 NOTE — ED Notes (Signed)
Pt sleeping@this  time. Breathing even and unlabored
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
## 2020-02-11 NOTE — ED Notes (Addendum)
Pt alert and oriented on the unit. Education, support, and encouragement provided. Discharge summary, medications and follow up appointments reviewed with pt. Suicide prevention resources provided. Pt's belongings in locker returned. Pt denies SI/HI, A/VH, pain, or any concerns at this time. Pt ambulatory on and off unit. Pt discharged to lobby. 

## 2020-02-11 NOTE — ED Notes (Signed)
Pt sleeping@this time. Breathing even and unlabored. Will continue to monitor for safety 

## 2020-02-11 NOTE — ED Notes (Signed)
Pt asleep with even and unlabored respirations. No distress or discomfort noted. Pt remains safe on the unit. Will continue to monitor. 

## 2020-02-12 LAB — PROLACTIN: Prolactin: 4.3 ng/mL (ref 4.0–15.2)

## 2020-02-16 ENCOUNTER — Telehealth (HOSPITAL_COMMUNITY): Payer: Self-pay | Admitting: General Practice

## 2020-02-16 NOTE — Telephone Encounter (Signed)
Care Management - Follow Up [facility] Discharges   Writer left a HIPPA compliant voice mail.    Writer left name and phone number for the patent to call back if further assistance is needed in scheduling a follow up appointment with an outpatient provider.     

## 2020-03-05 ENCOUNTER — Ambulatory Visit (HOSPITAL_COMMUNITY): Payer: Self-pay | Admitting: Clinical

## 2021-08-10 ENCOUNTER — Other Ambulatory Visit: Payer: Self-pay

## 2021-08-10 ENCOUNTER — Ambulatory Visit (HOSPITAL_COMMUNITY)
Admission: EM | Admit: 2021-08-10 | Discharge: 2021-08-10 | Disposition: A | Discharge status: Home / Self Care | Payer: Medicaid Other | Attending: Physician Assistant | Admitting: Physician Assistant

## 2021-08-10 ENCOUNTER — Encounter (HOSPITAL_COMMUNITY): Payer: Self-pay | Admitting: *Deleted

## 2021-08-10 DIAGNOSIS — Z23 Encounter for immunization: Secondary | ICD-10-CM

## 2021-08-10 DIAGNOSIS — S91301A Unspecified open wound, right foot, initial encounter: Secondary | ICD-10-CM

## 2021-08-10 MED ORDER — TETANUS-DIPHTH-ACELL PERTUSSIS 5-2.5-18.5 LF-MCG/0.5 IM SUSY
0.5000 mL | PREFILLED_SYRINGE | Freq: Once | INTRAMUSCULAR | Status: AC
  Administered 2021-08-10: 0.5 mL via INTRAMUSCULAR

## 2021-08-10 MED ORDER — MUPIROCIN 2 % EX OINT
1.0000 | TOPICAL_OINTMENT | Freq: Every day | CUTANEOUS | 0 refills | Status: DC
Start: 2021-08-10 — End: 2021-09-08

## 2021-08-10 MED ORDER — TETANUS-DIPHTH-ACELL PERTUSSIS 5-2.5-18.5 LF-MCG/0.5 IM SUSY
PREFILLED_SYRINGE | INTRAMUSCULAR | Status: AC
  Filled 2021-08-10: qty 0.5

## 2021-08-10 NOTE — Discharge Instructions (Signed)
Your wound appears to be healing appropriately.  Keep it clean with soap and water and apply Bactroban ointment with dressing changes.  We updated your tetanus today.  Use Tylenol ibuprofen for pain.  If you have any worsening symptoms including redness, drainage, bleeding, fever, nausea, vomiting you need to be seen immediately.

## 2021-08-10 NOTE — ED Provider Notes (Signed)
[facility]    CSN: [id number] Arrival date & time: 08/10/21  1257      History   Chief Complaint Chief Complaint  Patient presents with   Foot Pain    HPI CABLE Larry Simmons is a 31 y.o. male.   Patient presents today with a several day history of wound on his right heel/foot following injury.  Reports that he was helping a friend move when something sharp dropped out of a box and landed on his foot causing a wound.  He is unsure when his last tetanus was.  He reports cleaned this with hydrogen peroxide and soap and water and feels it is improving.  He does have a very physically demanding job and is having difficulty with his normal activities as prolonged ambulation tends to reopen the wound and increased pain.  He is confident that there is nothing broken as he is able to bear weight unassisted and reports that the item that fell on him was slight weight but sharp.   Past Medical History:  Diagnosis Date   Asthma    Eczema     Patient Active Problem List   Diagnosis Date Noted   Delusional disorder (HCC)     History reviewed. No pertinent surgical history.     Home Medications    Prior to Admission medications   Medication Sig Start Date End Date Taking? Authorizing Provider  mupirocin ointment (BACTROBAN) 2 % Apply 1 application. topically daily. 08/10/21  Yes Amelda Hapke K, PA-C  OLANZapine (ZYPREXA) 2.5 MG tablet Take 1 tablet (2.5 mg total) by mouth 2 (two) times daily. 02/11/20   Lenard Lance, FNP  traZODone (DESYREL) 50 MG tablet Take 1 tablet (50 mg total) by mouth at bedtime as needed for sleep. 02/11/20   Lenard Lance, FNP    Family History History reviewed. No pertinent family history.  Social History Social History   Tobacco Use   Smoking status: Every Day    Packs/day: 0.25    Types: Cigarettes   Smokeless tobacco: Never   Tobacco comments:    occassional  Substance Use Topics   Alcohol use: Yes    Comment: occassionl   Drug  use: Yes    Types: Marijuana     Allergies   Iodinated contrast media, Shellfish allergy, and Peanut-containing drug products   Review of Systems Review of Systems  Constitutional:  Positive for activity change. Negative for appetite change, fatigue and fever.  Musculoskeletal:  Negative for arthralgias, gait problem, joint swelling and myalgias.  Skin:  Positive for wound. Negative for color change.  Neurological:  Negative for dizziness, weakness, light-headedness, numbness and headaches.    Physical Exam Triage Vital Signs ED Triage Vitals  Enc Vitals Group     BP 08/10/21 1416 (!) 148/75     Pulse Rate 08/10/21 1416 (!) 56     Resp 08/10/21 1416 18     Temp 08/10/21 1416 97.7 F (36.5 C)     Temp src --      SpO2 08/10/21 1416 100 %     Weight --      Height --      Head Circumference --      Peak Flow --      Pain Score 08/10/21 1413 7     Pain Loc --      Pain Edu? --      Excl. in GC? --    No data found.  Updated Vital Signs  BP (!) 148/75   Pulse (!) 56   Temp 97.7 F (36.5 C)   Resp 18   SpO2 100%   Visual Acuity Right Eye Distance:   Left Eye Distance:   Bilateral Distance:    Right Eye Near:   Left Eye Near:    Bilateral Near:     Physical Exam Vitals reviewed.  Constitutional:      General: He is awake.     Appearance: Normal appearance. He is well-developed. He is not ill-appearing.     Comments: Very pleasant male appears stated age in no acute distress sitting comfortably in exam room  HENT:     Head: Normocephalic and atraumatic.     Mouth/Throat:     Pharynx: No oropharyngeal exudate, posterior oropharyngeal erythema or uvula swelling.  Cardiovascular:     Rate and Rhythm: Normal rate and regular rhythm.     Heart sounds: Normal heart sounds, S1 normal and S2 normal. No murmur heard.    Comments: Capillary fill within 2 seconds right toes Pulmonary:     Effort: Pulmonary effort is normal.     Breath sounds: Normal breath sounds.  No stridor. No wheezing, rhonchi or rales.     Comments: Clear to auscultation bilaterally Skin:    Findings: Abrasion and wound present.     Comments: 2 cm wound noted right posterior heel without bleeding or drainage.  No surrounding erythema or tenderness palpation.  Small abrasion anterior to right medial malleolus.  Neurological:     Mental Status: He is alert.  Psychiatric:        Behavior: Behavior is cooperative.     UC Treatments / Results  Labs (all labs ordered are listed, but only abnormal results are displayed) Labs Reviewed - No data to display  EKG   Radiology No results found.  Procedures Procedures (including critical care time)  Medications Ordered in UC Medications  Tdap (BOOSTRIX) injection 0.5 mL (has no administration in time range)    Initial Impression / Assessment and Plan / UC Course  I have reviewed the triage vital signs and the nursing notes.  Pertinent labs & imaging results that were available during my care of the patient were reviewed by me and considered in my medical decision making (see chart for details).     Patient in no concern for fracture and is able to bear weight without any significant pain.  His primary concern today is the wound on his heel which is preventing him from performing daily activities.  Discussed that this will take a while to heal and reviewed wound care procedures.  He is to keep area clean with soap and water and apply Bactroban ointment with dressing changes.  Discouraged the use of hydrogen peroxide and alcohol as this can delay healing.  Tetanus was updated today.  He was provided work excuse note for 3 days to allow for healing.  Discussed that if anything worsens and he has swelling, bleeding, drainage, increased pain, fever, nausea, vomiting he needs to be seen immediately.  Strict return precautions given to which he expressed understanding.  Final Clinical Impressions(s) / UC Diagnoses   Final diagnoses:   Open wound of right heel, initial encounter     Discharge Instructions      Your wound appears to be healing appropriately.  Keep it clean with soap and water and apply Bactroban ointment with dressing changes.  We updated your tetanus today.  Use Tylenol ibuprofen for pain.  If you  have any worsening symptoms including redness, drainage, bleeding, fever, nausea, vomiting you need to be seen immediately.     ED Prescriptions     Medication Sig Dispense Auth. Provider   mupirocin ointment (BACTROBAN) 2 % Apply 1 application. topically daily. 22 g Quaniya Damas K, PA-C      PDMP not reviewed this encounter.   Jeani HawkingRaspet, Bennye Nix K, PA-C 08/10/21 1451

## 2021-08-10 NOTE — ED Triage Notes (Signed)
Pt reports he injured his Rt foot on Thursday . Pt reports he cut some meat off his foot. 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

## 2021-08-15 ENCOUNTER — Ambulatory Visit (HOSPITAL_COMMUNITY): Admission: EM | Admit: 2021-08-15 | Discharge: 2021-08-15 | Discharge status: Absent without leave | Payer: Self-pay

## 2021-09-06 ENCOUNTER — Ambulatory Visit (HOSPITAL_COMMUNITY)
Admission: EM | Admit: 2021-09-06 | Discharge: 2021-09-06 | Disposition: A | Discharge status: Home / Self Care | Payer: No Payment, Other | Attending: Psychiatry | Admitting: Psychiatry

## 2021-09-06 DIAGNOSIS — Z20822 Contact with and (suspected) exposure to covid-19: Secondary | ICD-10-CM | POA: Insufficient documentation

## 2021-09-06 DIAGNOSIS — F22 Delusional disorders: Secondary | ICD-10-CM | POA: Insufficient documentation

## 2021-09-06 LAB — URINALYSIS, ROUTINE W REFLEX MICROSCOPIC
Bilirubin Urine: NEGATIVE
Glucose, UA: NEGATIVE mg/dL
Hgb urine dipstick: NEGATIVE
Ketones, ur: NEGATIVE mg/dL
Leukocytes,Ua: NEGATIVE
Nitrite: NEGATIVE
Protein, ur: NEGATIVE mg/dL
Specific Gravity, Urine: 1.024 (ref 1.005–1.030)
pH: 6 (ref 5.0–8.0)

## 2021-09-06 LAB — COMPREHENSIVE METABOLIC PANEL
ALT: 17 U/L (ref 0–44)
AST: 26 U/L (ref 15–41)
Albumin: 4.9 g/dL (ref 3.5–5.0)
Alkaline Phosphatase: 54 U/L (ref 38–126)
Anion gap: 14 (ref 5–15)
BUN: 10 mg/dL (ref 6–20)
CO2: 25 mmol/L (ref 22–32)
Calcium: 10.1 mg/dL (ref 8.9–10.3)
Chloride: 101 mmol/L (ref 98–111)
Creatinine, Ser: 1.03 mg/dL (ref 0.61–1.24)
GFR, Estimated: >60 mL/min (ref >60)
Glucose, Bld: 70 mg/dL (ref 70–99)
Potassium: 4.5 mmol/L (ref 3.5–5.1)
Sodium: 140 mmol/L (ref 135–145)
Total Bilirubin: 1 mg/dL (ref 0.3–1.2)
Total Protein: 7.9 g/dL (ref 6.5–8.1)

## 2021-09-06 LAB — POCT URINE DRUG SCREEN - MANUAL ENTRY (I-SCREEN)
POC Amphetamine UR: NOT DETECTED
POC Buprenorphine (BUP): NOT DETECTED
POC Cocaine UR: NOT DETECTED
POC Marijuana UR: NOT DETECTED
POC Methadone UR: NOT DETECTED
POC Methamphetamine UR: NOT DETECTED
POC Morphine: NOT DETECTED
POC Oxazepam (BZO): NOT DETECTED
POC Oxycodone UR: NOT DETECTED
POC Secobarbital (BAR): NOT DETECTED

## 2021-09-06 LAB — LIPID PANEL
Cholesterol: 220 mg/dL — ABNORMAL HIGH (ref 0–200)
HDL: 109 mg/dL (ref >40)
LDL Cholesterol: 102 mg/dL — ABNORMAL HIGH (ref 0–99)
Total CHOL/HDL Ratio: 2 RATIO
Triglycerides: 47 mg/dL (ref <150)
VLDL: 9 mg/dL (ref 0–40)

## 2021-09-06 LAB — CBC WITH DIFFERENTIAL/PLATELET
Abs Immature Granulocytes: 0.01 10*3/uL (ref 0.00–0.07)
Basophils Absolute: 0 10*3/uL (ref 0.0–0.1)
Basophils Relative: 1 %
Eosinophils Absolute: 0.4 10*3/uL (ref 0.0–0.5)
Eosinophils Relative: 12 %
HCT: 47.3 % (ref 39.0–52.0)
Hemoglobin: 15.4 g/dL (ref 13.0–17.0)
Immature Granulocytes: 0 %
Lymphocytes Relative: 16 %
Lymphs Abs: 0.6 10*3/uL — ABNORMAL LOW (ref 0.7–4.0)
MCH: 30.4 pg (ref 26.0–34.0)
MCHC: 32.6 g/dL (ref 30.0–36.0)
MCV: 93.5 fL (ref 80.0–100.0)
Monocytes Absolute: 0.4 10*3/uL (ref 0.1–1.0)
Monocytes Relative: 12 %
Neutro Abs: 2 10*3/uL (ref 1.7–7.7)
Neutrophils Relative %: 59 %
Platelets: 266 10*3/uL (ref 150–400)
RBC: 5.06 MIL/uL (ref 4.22–5.81)
RDW: 12.8 % (ref 11.5–15.5)
WBC: 3.5 10*3/uL — ABNORMAL LOW (ref 4.0–10.5)
nRBC: 0 % (ref 0.0–0.2)

## 2021-09-06 LAB — RESP PANEL BY RT-PCR (FLU A&B, COVID) ARPGX2
Influenza A by PCR: NEGATIVE
Influenza B by PCR: NEGATIVE
SARS Coronavirus 2 by RT PCR: NEGATIVE

## 2021-09-06 LAB — HEMOGLOBIN A1C
Hgb A1c MFr Bld: 5.2 % (ref 4.8–5.6)
Mean Plasma Glucose: 102.54 mg/dL

## 2021-09-06 LAB — TSH: TSH: 1.974 u[IU]/mL (ref 0.350–4.500)

## 2021-09-06 LAB — ETHANOL: Alcohol, Ethyl (B): <10 mg/dL (ref <10)

## 2021-09-06 LAB — POC SARS CORONAVIRUS 2 AG: SARSCOV2ONAVIRUS 2 AG: NEGATIVE

## 2021-09-06 MED ORDER — MAGNESIUM HYDROXIDE 400 MG/5ML PO SUSP: 30.0000 mL | Freq: Every day | ORAL | Status: DC | PRN

## 2021-09-06 MED ORDER — NICOTINE 14 MG/24HR TD PT24: 14.0000 mg | MEDICATED_PATCH | Freq: Every day | TRANSDERMAL | Status: DC

## 2021-09-06 MED ORDER — OLANZAPINE 5 MG PO TBDP: 5.0000 mg | ORAL_TABLET | Freq: Every day | ORAL | Status: DC

## 2021-09-06 MED ORDER — ALUM & MAG HYDROXIDE-SIMETH 200-200-20 MG/5ML PO SUSP: 30.0000 mL | ORAL | Status: DC | PRN

## 2021-09-06 MED ORDER — OLANZAPINE 5 MG PO TBDP: 2.5000 mg | ORAL_TABLET | Freq: Every day | ORAL | Status: DC

## 2021-09-06 MED ORDER — OLANZAPINE 5 MG PO TBDP
5.0000 mg | ORAL_TABLET | Freq: Three times a day (TID) | ORAL | Status: DC | PRN
Start: 2021-09-06 — End: 2021-09-06

## 2021-09-06 MED ORDER — ZIPRASIDONE MESYLATE 20 MG IM SOLR: 20.0000 mg | INTRAMUSCULAR | Status: DC | PRN

## 2021-09-06 MED ORDER — ACETAMINOPHEN 325 MG PO TABS: 650.0000 mg | ORAL_TABLET | Freq: Four times a day (QID) | ORAL | Status: DC | PRN

## 2021-09-06 MED ORDER — LORAZEPAM 1 MG PO TABS: 1.0000 mg | ORAL_TABLET | ORAL | Status: DC | PRN

## 2021-09-06 MED ORDER — NICOTINE POLACRILEX 2 MG MT GUM: 2.0000 mg | CHEWING_GUM | OROMUCOSAL | Status: DC | PRN
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
## 2021-09-07 LAB — RPR: RPR Ser Ql: NONREACTIVE

## 2021-09-08 ENCOUNTER — Encounter (HOSPITAL_COMMUNITY): Payer: Self-pay | Admitting: Emergency Medicine

## 2021-09-08 ENCOUNTER — Ambulatory Visit (HOSPITAL_COMMUNITY)
Admission: EM | Admit: 2021-09-08 | Discharge: 2021-09-08 | Disposition: A | Discharge status: Other Acute Inpatient Hospital | Payer: No Payment, Other | Attending: Family | Admitting: Family

## 2021-09-08 DIAGNOSIS — F22 Delusional disorders: Secondary | ICD-10-CM | POA: Diagnosis not present

## 2021-09-08 DIAGNOSIS — Z20822 Contact with and (suspected) exposure to covid-19: Secondary | ICD-10-CM | POA: Insufficient documentation

## 2021-09-08 LAB — RESP PANEL BY RT-PCR (FLU A&B, COVID) ARPGX2
Influenza A by PCR: NEGATIVE
Influenza B by PCR: NEGATIVE
SARS Coronavirus 2 by RT PCR: NEGATIVE

## 2021-09-08 LAB — COMPREHENSIVE METABOLIC PANEL
ALT: 18 U/L (ref 0–44)
AST: 30 U/L (ref 15–41)
Albumin: 4.6 g/dL (ref 3.5–5.0)
Alkaline Phosphatase: 52 U/L (ref 38–126)
Anion gap: 11 (ref 5–15)
BUN: 13 mg/dL (ref 6–20)
CO2: 27 mmol/L (ref 22–32)
Calcium: 9.7 mg/dL (ref 8.9–10.3)
Chloride: 102 mmol/L (ref 98–111)
Creatinine, Ser: 1.01 mg/dL (ref 0.61–1.24)
GFR, Estimated: >60 mL/min (ref >60)
Glucose, Bld: 109 mg/dL — ABNORMAL HIGH (ref 70–99)
Potassium: 3.9 mmol/L (ref 3.5–5.1)
Sodium: 140 mmol/L (ref 135–145)
Total Bilirubin: 0.8 mg/dL (ref 0.3–1.2)
Total Protein: 7.9 g/dL (ref 6.5–8.1)

## 2021-09-08 LAB — LIPID PANEL
Cholesterol: 211 mg/dL — ABNORMAL HIGH (ref 0–200)
HDL: 102 mg/dL (ref >40)
LDL Cholesterol: 99 mg/dL (ref 0–99)
Total CHOL/HDL Ratio: 2.1 RATIO
Triglycerides: 49 mg/dL (ref <150)
VLDL: 10 mg/dL (ref 0–40)

## 2021-09-08 LAB — CBC WITH DIFFERENTIAL/PLATELET
Abs Immature Granulocytes: 0 10*3/uL (ref 0.00–0.07)
Basophils Absolute: 0 10*3/uL (ref 0.0–0.1)
Basophils Relative: 1 %
Eosinophils Absolute: 0.4 10*3/uL (ref 0.0–0.5)
Eosinophils Relative: 11 %
HCT: 46.1 % (ref 39.0–52.0)
Hemoglobin: 15.2 g/dL (ref 13.0–17.0)
Immature Granulocytes: 0 %
Lymphocytes Relative: 14 %
Lymphs Abs: 0.5 10*3/uL — ABNORMAL LOW (ref 0.7–4.0)
MCH: 30.2 pg (ref 26.0–34.0)
MCHC: 33 g/dL (ref 30.0–36.0)
MCV: 91.5 fL (ref 80.0–100.0)
Monocytes Absolute: 0.3 10*3/uL (ref 0.1–1.0)
Monocytes Relative: 9 %
Neutro Abs: 2.3 10*3/uL (ref 1.7–7.7)
Neutrophils Relative %: 65 %
Platelets: 258 10*3/uL (ref 150–400)
RBC: 5.04 MIL/uL (ref 4.22–5.81)
RDW: 12.3 % (ref 11.5–15.5)
WBC: 3.5 10*3/uL — ABNORMAL LOW (ref 4.0–10.5)
nRBC: 0 % (ref 0.0–0.2)

## 2021-09-08 LAB — POCT URINE DRUG SCREEN - MANUAL ENTRY (I-SCREEN)
POC Amphetamine UR: NOT DETECTED
POC Buprenorphine (BUP): NOT DETECTED
POC Cocaine UR: NOT DETECTED
POC Marijuana UR: NOT DETECTED
POC Methadone UR: NOT DETECTED
POC Methamphetamine UR: NOT DETECTED
POC Morphine: NOT DETECTED
POC Oxazepam (BZO): NOT DETECTED
POC Oxycodone UR: NOT DETECTED
POC Secobarbital (BAR): NOT DETECTED

## 2021-09-08 LAB — GC/CHLAMYDIA PROBE AMP (~~LOC~~) NOT AT ARMC
Chlamydia: NEGATIVE
Comment: NEGATIVE
Comment: NORMAL
Neisseria Gonorrhea: NEGATIVE

## 2021-09-08 LAB — POC SARS CORONAVIRUS 2 AG: SARSCOV2ONAVIRUS 2 AG: NEGATIVE

## 2021-09-08 LAB — TSH: TSH: 2.994 u[IU]/mL (ref 0.350–4.500)

## 2021-09-08 MED ORDER — RISPERIDONE 2 MG PO TABS: 2.0000 mg | ORAL_TABLET | Freq: Every day | ORAL | Status: DC

## 2021-09-08 MED ORDER — OLANZAPINE 5 MG PO TABS: 5.0000 mg | ORAL_TABLET | Freq: Two times a day (BID) | ORAL | Status: DC

## 2021-09-08 MED ORDER — TRAZODONE HCL 50 MG PO TABS: 50.0000 mg | ORAL_TABLET | Freq: Every evening | ORAL | Status: DC | PRN

## 2021-09-08 MED ORDER — RISPERIDONE 2 MG PO TABS
2.0000 mg | ORAL_TABLET | Freq: Every day | ORAL | Status: DC
  Administered 2021-09-08: 2 mg via ORAL
  Filled 2021-09-08: qty 1

## 2021-09-08 MED ORDER — ACETAMINOPHEN 325 MG PO TABS: 650.0000 mg | ORAL_TABLET | Freq: Four times a day (QID) | ORAL | Status: DC | PRN

## 2021-09-08 MED ORDER — RISPERIDONE 0.5 MG PO TABS
0.5000 mg | ORAL_TABLET | Freq: Every day | ORAL | Status: DC
Start: 2021-09-08 — End: 2021-09-08

## 2021-09-08 MED ORDER — RISPERIDONE 1 MG PO TABS
1.0000 mg | ORAL_TABLET | Freq: Every day | ORAL | Status: DC
Start: 2021-09-08 — End: 2021-09-09
  Administered 2021-09-08: 1 mg via ORAL
  Filled 2021-09-08: qty 1

## 2021-09-08 MED ORDER — HYDROXYZINE HCL 25 MG PO TABS: 25.0000 mg | ORAL_TABLET | Freq: Three times a day (TID) | ORAL | Status: DC | PRN

## 2021-09-08 MED ORDER — ALUM & MAG HYDROXIDE-SIMETH 200-200-20 MG/5ML PO SUSP: 30.0000 mL | ORAL | Status: DC | PRN

## 2021-09-08 MED ORDER — MAGNESIUM HYDROXIDE 400 MG/5ML PO SUSP: 30.0000 mL | Freq: Every day | ORAL | Status: DC | PRN

## 2021-09-08 NOTE — ED Notes (Signed)
Pt sleeping at present, very labile.  No distress noted.  Monitoring for safety.

## 2021-09-08 NOTE — ED Triage Notes (Signed)
Pt Larry Simmons presents to [facility] seeking a mental health evaluation. Pt states that he wants to get resources for career counseling. Pt states "I just need somebody to help me to better myself for the future". Pt states he has been to counseling in the past with his fiance, marriage counseling. Pt reports some mild relationship issues. Pt states he went upstairs to the outpatient for walk-in services but they were full, so he came to the urgent care to be seen.Pt denies any diagnosis, denies being prescribed any medication. Pt denies SI/HI, NSSIB, and AVH.

## 2021-09-08 NOTE — ED Notes (Signed)
Pt laying down with eyes closed in no acute distress. RR even and unlabored. Safety maintained.

## 2021-09-08 NOTE — Progress Notes (Addendum)
Pt was accepted to [facility] 09/08/2021; Bed Assignment 500-1  Pt meets inpatient criteria per Hillery Jacks, NP  Attending Physician will be Dr. Loleta Chance  Report can be called to: -Adult unit: [phone]  Pt can arrive after 2200  Voluntary can be faxed to [phone].  Care Team: [facility] [name], RN, Hillery Jacks, NP, and Sharion Dove, RN.  Kelton Pillar, LCSWA 09/08/2021 @ 12:24 PM
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
## 2021-09-08 NOTE — ED Notes (Signed)
Report to [facility] 500 Hall attempted x 1.  Unit to return call.

## 2021-09-08 NOTE — Discharge Instructions (Addendum)
Take all medications as prescribed. Keep all follow-up appointments as scheduled.  Do not consume alcohol or use illegal drugs while on prescription medications. Report any adverse effects from your medications to your primary care provider promptly.  In the event of recurrent symptoms or worsening symptoms, call 911, a crisis hotline, or go to the nearest emergency department for evaluation.   

## 2021-09-08 NOTE — ED Notes (Signed)
Safe Transport to [facility] requested.

## 2021-09-08 NOTE — ED Notes (Signed)
Pt sleeping at present, no distress noted.  Calm & cooperative,  Monitoring for safety.

## 2021-09-08 NOTE — ED Notes (Signed)
Patient resting quietly in bed with eyes closed, Respirations equal and unlabored, skin warm and dry, NAD. Routine safety checks conducted according to facility protocol. Will continue to monitor for safety. 

## 2021-09-08 NOTE — ED Notes (Signed)
Pt sleeping in no acute distress. RR even and unlabored. Safety maintained. 

## 2021-09-08 NOTE — ED Notes (Signed)
Report called to RN Erskine Squibb,  [address], [facility].  Pending transfer at 11pm via [employer].
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
## 2021-09-08 NOTE — ED Notes (Signed)
Pt made aware of tx plan to transfer him to [facility] @2200  tonight. Pt signed voluntary consent for treatment form and verbalized agreement to POC. Will continue to monitor for safety.

## 2021-09-09 ENCOUNTER — Other Ambulatory Visit: Payer: Self-pay

## 2021-09-09 ENCOUNTER — Inpatient Hospital Stay (HOSPITAL_COMMUNITY)
Admission: AD | Admit: 2021-09-09 | Discharge: 2021-09-17 | DRG: 885 | Disposition: A | Discharge status: Home / Self Care | Payer: No Typology Code available for payment source | Source: Intra-hospital | Attending: Emergency Medicine | Admitting: Emergency Medicine

## 2021-09-09 ENCOUNTER — Encounter (HOSPITAL_COMMUNITY): Payer: Self-pay | Admitting: Family

## 2021-09-09 DIAGNOSIS — F17210 Nicotine dependence, cigarettes, uncomplicated: Secondary | ICD-10-CM | POA: Diagnosis present

## 2021-09-09 DIAGNOSIS — Z20822 Contact with and (suspected) exposure to covid-19: Secondary | ICD-10-CM | POA: Diagnosis present

## 2021-09-09 DIAGNOSIS — F29 Unspecified psychosis not due to a substance or known physiological condition: Principal | ICD-10-CM | POA: Diagnosis present

## 2021-09-09 DIAGNOSIS — L309 Dermatitis, unspecified: Secondary | ICD-10-CM | POA: Diagnosis present

## 2021-09-09 DIAGNOSIS — Z79899 Other long term (current) drug therapy: Secondary | ICD-10-CM | POA: Diagnosis not present

## 2021-09-09 DIAGNOSIS — F23 Brief psychotic disorder: Secondary | ICD-10-CM

## 2021-09-09 DIAGNOSIS — Z818 Family history of other mental and behavioral disorders: Secondary | ICD-10-CM | POA: Diagnosis not present

## 2021-09-09 DIAGNOSIS — F419 Anxiety disorder, unspecified: Secondary | ICD-10-CM | POA: Diagnosis present

## 2021-09-09 DIAGNOSIS — F22 Delusional disorders: Secondary | ICD-10-CM

## 2021-09-09 LAB — PROLACTIN: Prolactin: 3.2 ng/mL — ABNORMAL LOW (ref 4.0–15.2)

## 2021-09-09 MED ORDER — RISPERIDONE 1 MG PO TABS
1.0000 mg | ORAL_TABLET | Freq: Every day | ORAL | Status: DC
  Administered 2021-09-09: 1 mg via ORAL
  Filled 2021-09-09 (×2): qty 1

## 2021-09-09 MED ORDER — TRAZODONE HCL 50 MG PO TABS
50.0000 mg | ORAL_TABLET | Freq: Every evening | ORAL | Status: DC | PRN
  Administered 2021-09-10: 50 mg via ORAL
  Filled 2021-09-09: qty 1

## 2021-09-09 MED ORDER — LORAZEPAM 1 MG PO TABS: 1.0000 mg | ORAL_TABLET | Freq: Three times a day (TID) | ORAL | Status: DC | PRN

## 2021-09-09 MED ORDER — OLANZAPINE 5 MG PO TBDP: 5.0000 mg | ORAL_TABLET | Freq: Three times a day (TID) | ORAL | Status: DC | PRN

## 2021-09-09 MED ORDER — HYDROXYZINE HCL 25 MG PO TABS
25.0000 mg | ORAL_TABLET | Freq: Three times a day (TID) | ORAL | Status: DC | PRN
  Administered 2021-09-09 – 2021-09-16 (×6): 25 mg via ORAL
  Filled 2021-09-09 (×6): qty 1

## 2021-09-09 MED ORDER — MAGNESIUM HYDROXIDE 400 MG/5ML PO SUSP: 30.0000 mL | Freq: Every day | ORAL | Status: DC | PRN

## 2021-09-09 MED ORDER — ACETAMINOPHEN 325 MG PO TABS: 650.0000 mg | ORAL_TABLET | Freq: Four times a day (QID) | ORAL | Status: DC | PRN

## 2021-09-09 MED ORDER — RISPERIDONE 2 MG PO TBDP
2.0000 mg | ORAL_TABLET | Freq: Every day | ORAL | Status: DC
  Administered 2021-09-09 – 2021-09-13 (×5): 2 mg via ORAL
  Filled 2021-09-09 (×9): qty 1

## 2021-09-09 MED ORDER — RISPERIDONE 2 MG PO TABS
2.0000 mg | ORAL_TABLET | Freq: Every day | ORAL | Status: DC
  Filled 2021-09-09: qty 1

## 2021-09-09 MED ORDER — ALUM & MAG HYDROXIDE-SIMETH 200-200-20 MG/5ML PO SUSP: 30.0000 mL | ORAL | Status: DC | PRN

## 2021-09-09 MED ORDER — HYDROCORTISONE 1 % EX CREA
TOPICAL_CREAM | Freq: Three times a day (TID) | CUTANEOUS | Status: DC
  Administered 2021-09-10 – 2021-09-14 (×6): 1 via TOPICAL
  Filled 2021-09-09 (×2): qty 28

## 2021-09-09 MED ORDER — RISPERIDONE 1 MG PO TBDP
1.0000 mg | ORAL_TABLET | Freq: Every day | ORAL | Status: DC
  Administered 2021-09-10: 1 mg via ORAL
  Filled 2021-09-09 (×2): qty 1

## 2021-09-09 MED ORDER — WHITE PETROLATUM EX OINT
TOPICAL_OINTMENT | CUTANEOUS | Status: AC
  Filled 2021-09-09: qty 10

## 2021-09-09 MED ORDER — ZIPRASIDONE MESYLATE 20 MG IM SOLR
20.0000 mg | Freq: Three times a day (TID) | INTRAMUSCULAR | Status: DC | PRN
  Filled 2021-09-09: qty 20

## 2021-09-09 NOTE — H&P (Signed)
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 regarding goals of hospitalization, psycho-education, and discharge planning needs.   Janine Limbo, MD Psychiatrist

## 2021-09-09 NOTE — Plan of Care (Signed)
  Problem: Coping: Goal: Ability to identify and develop effective coping behavior will improve Outcome: Progressing   Problem: Activity: Goal: Interest or engagement in leisure activities will improve Outcome: Progressing   Problem: Medication: Goal: Compliance with prescribed medication regimen will improve Outcome: Progressing

## 2021-09-09 NOTE — Progress Notes (Signed)
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)  Coping Skills:   Isolation, Write, Sports, TV, Music, Exercise, Meditate, Deep Breathing, Talk, Art, Prayer, Read, Avoidance, Hot Bath/Shower  Leisure Interests (2+):  Sports - Basketball, Sports - Football, [employer] - [employer], Games - [employer], Social - Family  Frequency of Recreation/Participation: Other (Comment) Control and instrumentation engineer, Video Games- Weekly; Everything else- Not recently)  Awareness of Community Resources:  Yes  Community Resources:  Restaurants, [place], [employer], [employer], Research scientist (physical sciences)  Current Use: Yes  If no, Barriers?:    Expressed Interest in [employer] Information: No  [employer] of Residence:  [place]  Patient Main Form of Transportation: Set designer  Patient Strengths:  Problem Solving, Good with hands; Helping Others  Patient Identified Areas of Improvement:  None  Patient Goal for Hospitalization:  "succeed in career and set boundaries in relationship"  Current SI (including self-harm):  No  Current HI:  No  Current AVH: No  Staff Intervention Plan: Group Attendance, Collaborate with Interdisciplinary Treatment Team  Consent to Intern Participation: N/A   Caroll Rancher, Richardean Sale, Hever Castilleja A 09/09/2021, 12:38 PM
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
## 2021-09-09 NOTE — Tx Team (Signed)
Initial Treatment Plan 09/09/2021 3:16 AM Donnald Garre ELY:[id number]    PATIENT STRESSORS: Financial difficulties   Health problems   Occupational concerns   Substance abuse     PATIENT STRENGTHS: Average or above average intelligence  Supportive family/friends  Work skills    PATIENT IDENTIFIED PROBLEMS: Depression  Anxiety  "Being successful in my career"   "How to stay away from negative people"               DISCHARGE CRITERIA:  Adequate post-discharge living arrangements Improved stabilization in mood, thinking, and/or behavior Medical problems require only outpatient monitoring Verbal commitment to aftercare and medication compliance  PRELIMINARY DISCHARGE PLAN: Attend aftercare/continuing care group Attend PHP/IOP Outpatient therapy Placement in alternative living arrangements Return to previous work or school arrangements  PATIENT/FAMILY INVOLVEMENT: This treatment plan has been presented to and reviewed with the patient, Larry Simmons, and/or family member.  The patient and family have been given the opportunity to ask questions and make suggestions.  Bethann Punches, RN 09/09/2021, 3:16 AM
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
## 2021-09-09 NOTE — Group Note (Signed)
Recreation Therapy Group Note   Group Topic:Animal Assisted Therapy   Group Date: 09/09/2021 Start Time: 1430 End Time: 1515 Facilitators: Caroll Rancher, LRT,CTRS Location: [address] Dayroom   Animal-Assisted Activity (AAA) Program Checklist/Progress Notes Patient Eligibility Criteria Checklist & Daily Group note for Rec Tx Intervention  AAA/T Program Assumption of Risk Form signed by Patient/ or Parent Legal Guardian Yes  Patient is free of allergies or severe asthma Yes  Patient reports no fear of animals Yes  Patient reports no history of cruelty to animals Yes  Patient understands his/her participation is voluntary Yes  Patient washes hands before animal contact Yes  Patient washes hands after animal contact Yes   Affect/Mood: Appropriate   Participation Level: Engaged    Clinical Observations/Individualized Feedback:  Patient attended session and interacted appropriately with therapy dog and peers. Patient asked appropriate questions about therapy dog and his training. Patient shared stories about their pets at home with group.    Plan: Continue to engage patient in RT group sessions 2-3x/week.   Caroll Rancher, LRT,CTRS 09/09/2021 4:20 PM
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
## 2021-09-09 NOTE — Progress Notes (Signed)
   09/09/21 1039  Psych Admission Type (Psych Patients Only)  Admission Status Voluntary  Psychosocial Assessment  Patient Complaints Anxiety  Eye Contact Fair  Facial Expression Anxious  Affect Anxious  Speech Pressured  Interaction Assertive  Motor Activity Other (Comment) (WNL)  Appearance/Hygiene Unremarkable  Behavior Characteristics Cooperative;Calm  Mood Depressed;Anxious  Thought Process  Coherency Concrete thinking  Content Paranoia  Delusions None reported or observed  Perception WDL  Hallucination None reported or observed  Judgment Limited  Confusion None  Danger to Self  Agreement Not to Harm Self Yes  Description of Agreement verbal  Danger to Others  Danger to Others None reported or observed
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
## 2021-09-10 ENCOUNTER — Encounter (HOSPITAL_COMMUNITY): Payer: Self-pay

## 2021-09-10 MED ORDER — TRAZODONE HCL 50 MG PO TABS
50.0000 mg | ORAL_TABLET | Freq: Every day | ORAL | Status: DC
  Administered 2021-09-10 – 2021-09-16 (×7): 50 mg via ORAL
  Filled 2021-09-10 (×8): qty 1
  Filled 2021-09-10: qty 7
  Filled 2021-09-10: qty 1

## 2021-09-10 MED ORDER — WHITE PETROLATUM EX OINT
TOPICAL_OINTMENT | CUTANEOUS | Status: AC
  Filled 2021-09-10: qty 5

## 2021-09-10 MED ORDER — HYDROCERIN EX CREA
TOPICAL_CREAM | Freq: Two times a day (BID) | CUTANEOUS | Status: DC
  Administered 2021-09-10 – 2021-09-14 (×5): 1 via TOPICAL
  Filled 2021-09-10: qty 113

## 2021-09-10 MED ORDER — RISPERIDONE 2 MG PO TBDP
2.0000 mg | ORAL_TABLET | Freq: Every day | ORAL | Status: DC
  Administered 2021-09-11 – 2021-09-14 (×4): 2 mg via ORAL
  Filled 2021-09-10 (×7): qty 1

## 2021-09-10 MED ORDER — WHITE PETROLATUM EX OINT
TOPICAL_OINTMENT | CUTANEOUS | Status: AC
  Administered 2021-09-10: 1
  Filled 2021-09-10: qty 5

## 2021-09-10 MED ORDER — BENZTROPINE MESYLATE 0.5 MG PO TABS
0.5000 mg | ORAL_TABLET | Freq: Two times a day (BID) | ORAL | Status: DC
  Administered 2021-09-10 – 2021-09-17 (×14): 0.5 mg via ORAL
  Filled 2021-09-10 (×8): qty 1
  Filled 2021-09-10: qty 14
  Filled 2021-09-10 (×7): qty 1
  Filled 2021-09-10: qty 14
  Filled 2021-09-10 (×2): qty 1

## 2021-09-10 MED ORDER — WHITE PETROLATUM EX OINT
TOPICAL_OINTMENT | CUTANEOUS | Status: AC
  Administered 2021-09-10: 1
  Filled 2021-09-10: qty 10

## 2021-09-10 NOTE — Consult Note (Signed)
Call for eczema flare. Per attending, he's had severe eczema his whole life. His current flare has been ongoing for some time. He is on a hydrocortisone cream at baseline. Recommend that he continue for now. He will ultimately need a referral to dermatology as he may need phototherapy or a biologic.   Teddy Spike, DO

## 2021-09-10 NOTE — Group Note (Signed)
LCSW Group Therapy  Type of Therapy and Topic:  Group Therapy: Thoughts, Feelings, and Actions  Participation Level:  Active   Description of Group:   In this group, each patient discussed their previous experiencing and understanding of overthinking, identifying the harmful impact on their lives. As a group, each patient was introduced to the basic concepts of Cognitive Behavioral Therapy: that thoughts, feelings, and actions are all connected and influence one another. They were given examples of how overthinking can affect our feelings, actions, and vise versa. The group was then asked to analyze how overthinking was harmful and brainstorm alternative thinking patterns/reactions to the example situation. Then, each group member filled out and identified their own example situation in which a problem situation caused their thoughts, feelings, and actions to be negatively impacted; they were asked to come up with 3 new (more adaptive/positive) thoughts that led to 3 new feelings and actions.  Therapeutic Goals: Patients will review and discuss their past experience with overthinking. Patients will learn the basics of the CBT model through group-led examples.. Patients will identify situations where they may have negative thoughts, feelings, or actions and will then reframe the situation using more positive thoughts to react differently.  Summary of Patient Progress:  The patient shared that they they have the core belief of "I am not worthy," discussed challenging that belief by saying "I am significant" based on experience that kids look up to him. Patient contributed to the discussion of how thoughts, feelings, and actions interact, noting when they may have experienced a negative thought pattern and recognized it as harmful. They were attentive when other patients shared their experiences, and worked to reframe their own thoughts in an activity to identify future situations where they may  typically overthink.  Therapeutic Modalities:   Cognitive Behavioral Therapy Mindfulness  Beatris Si, LCSW 09/10/2021  2:06 PM

## 2021-09-10 NOTE — Progress Notes (Addendum)
[facility] MD Progress Note  09/10/2021 12:41 PM Larry Simmons  MRN:  [id number]  Subjective:   Patient is a 31 year old male with unclear psychiatric history, who was admitted to the psychiatric hospital for evaluations of paranoia and delusions about someone hurting him.  Patient was medically cleared at the [facility] prior to admission to the psychiatric unit.  Yesterday the psychiatry team made the following recommendations: -Continue Risperdal 1 mg in the morning and 2 mg at bedtime for psychosis.  Up to this interview, the patient had only received 1 mg of Risperdal since admission to the psychiatric hospital.  We will see how the patient responds to 2 mg of Risperdal this evening, and Risperdal 1 mg in the morning, prior to my evaluation of him again tomorrow.  -Start agitation protocol with Zyprexa p.o., Ativan p.o., and Geodon IM -Continue trazodone 50 mg nightly as needed -Start hydroxyzine 25 mg as needed for anxiety and itching -Restart home hydrocortisone cream for eczema.  If the itching and inflammation is not decreased with this, we will consider IM consultation  On my assessment today, the patient is less suspicious.  He participates in the interview.  He is mostly logical and linear.  He has been accepting his Risperdal medication.  He reports that mood is fine.  Denies any side effects to Risperdal including tremors, muscle cramps or stiffness. He reports his sleep was poor last night due to difficulty initiating sleep and maintaining sleep. He reports his appetite is okay.  Concentration is better but still impaired. Patient reports that he was having paranoid thoughts outside of the hospital, prior to coming into the hospital, that his fiance was out to get him, and that everyone was out to get him.  He reports he no longer feels this way at this time.  He denies having other psychotic symptoms such as auditory hallucinations, visual hallucinations, thought control, thought insertion,  or ideas of reference.  On my exam he does not appear to be responding to internal stimuli. He denies any SI.  Denies HI.  Patient is agreeable to increasing Risperdal and starting Cogentin.  Patient is also agreeable to scheduling trazodone for sleep. I will also internal medicine about eczema treatment, and patient is agreeable with this consult.  I spoke with IM Dr. Ronaldo Miyamoto around 2:55pm today - recommended continue home regimen of hydrocortisone cream and see dermatologist as outpatient.   Principal Problem: Brief psychotic disorder (HCC) Diagnosis: Principal Problem:   Brief psychotic disorder (HCC) Active Problems:   Paranoid delusion (HCC)  Total Time spent with patient: 20 minutes  Past Psychiatric History:  Paranoid symptoms since at least 2021.  No psychiatric medications prescribed prior to admission.  Past Medical History:  Past Medical History:  Diagnosis Date   Asthma    Eczema    History reviewed. No pertinent surgical history. Family History: History reviewed. No pertinent family history.  Family Psychiatric  History: Per social work, sister stated that mother has schizophrenia.  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  Substance and Sexual Activity   Alcohol use: Yes    Comment: occassionl   Drug use: Yes    Types: Marijuana   Sexual activity:  Yes  Other Topics Concern   Not on file  Social History Narrative   Not on file   Social Determinants of Health   Financial Resource Strain: Not on file  Food Insecurity: Not on file  Transportation Needs: Not on file  Physical Activity: Not on file  Stress: Not on file  Social Connections: Not on file   Additional Social History:                         Sleep: Poor  Appetite:  Fair  Current Medications: Current Facility-Administered Medications  Medication Dose Route Frequency Provider Last Rate Last Admin   acetaminophen (TYLENOL) tablet 650 mg  650 mg Oral Q6H PRN Onuoha, Chinwendu V, NP       alum & mag hydroxide-simeth (MAALOX/MYLANTA) 200-200-20 MG/5ML suspension 30 mL  30 mL Oral Q4H PRN Onuoha, Chinwendu V, NP       benztropine (COGENTIN) tablet 0.5 mg  0.5 mg Oral BID Matthewjames Petrasek, Ovid Curd, MD       hydrocortisone cream 1 %   Topical TID Janine Limbo, MD   Given at 09/10/21 1152   hydrOXYzine (ATARAX) tablet 25 mg  25 mg Oral TID PRN Janine Limbo, MD   25 mg at 09/09/21 2047   OLANZapine zydis (ZYPREXA) disintegrating tablet 5 mg  5 mg Oral Q8H PRN Sharan Mcenaney, Ovid Curd, MD       And   LORazepam (ATIVAN) tablet 1 mg  1 mg Oral Q8H PRN Kandis Henry, MD       And   ziprasidone (GEODON) injection 20 mg  20 mg Intramuscular Q8H PRN Aynsley Fleet, MD       magnesium hydroxide (MILK OF MAGNESIA) suspension 30 mL  30 mL Oral Daily PRN Onuoha, Chinwendu V, NP       risperiDONE (RISPERDAL M-TABS) disintegrating tablet 2 mg  2 mg Oral QHS Moselle Rister, MD   2 mg at 09/09/21 2047   [START ON 09/11/2021] risperiDONE (RISPERDAL M-TABS) disintegrating tablet 2 mg  2 mg Oral Daily Ronel Rodeheaver, MD       traZODone (DESYREL) tablet 50 mg  50 mg Oral QHS PRN Onuoha, Chinwendu V, NP   50 mg at 09/10/21 [id number]    Lab Results:  Results for orders placed or performed during the hospital encounter of 09/08/21 (from the past 48 hour(s))  POCT Urine Drug  Screen - (I-Screen)     Status: Normal   Collection Time: 09/08/21  8:14 PM  Result Value Ref Range   POC Amphetamine UR None Detected NONE DETECTED (Cut Off Level 1000 ng/mL)   POC Secobarbital (BAR) None Detected NONE DETECTED (Cut Off Level 300 ng/mL)   POC Buprenorphine (BUP) None Detected NONE DETECTED (Cut Off Level 10 ng/mL)   POC Oxazepam (BZO) None Detected NONE DETECTED (Cut Off Level 300 ng/mL)   POC Cocaine UR None Detected NONE DETECTED (Cut Off Level 300 ng/mL)   POC Methamphetamine UR None Detected NONE DETECTED (Cut Off Level 1000 ng/mL)   POC  Morphine None Detected NONE DETECTED (Cut Off Level 300 ng/mL)   POC Methadone UR None Detected NONE DETECTED (Cut Off Level 300 ng/mL)   POC Oxycodone UR None Detected NONE DETECTED (Cut Off Level 100 ng/mL)   POC Marijuana UR None Detected NONE DETECTED (Cut Off Level 50 ng/mL)    Blood Alcohol level:  Lab Results  Component Value Date   ETH <10 09/06/2021   ETH <10 02/10/2020    Metabolic Disorder Labs: Lab Results  Component Value Date   HGBA1C 5.2 09/06/2021   MPG 102.54 09/06/2021   MPG 108.28 02/10/2020   Lab Results  Component Value Date   PROLACTIN 3.2 (L) 09/08/2021   PROLACTIN 4.3 02/10/2020   Lab Results  Component Value Date   CHOL 211 (H) 09/08/2021   TRIG 49 09/08/2021   HDL 102 09/08/2021   CHOLHDL 2.1 09/08/2021   VLDL 10 09/08/2021   LDLCALC 99 09/08/2021   LDLCALC 102 (H) 09/06/2021    Physical Findings: AIMS:  , ,  ,  ,    CIWA:    COWS:     Musculoskeletal: Strength & Muscle Tone: within normal limits Gait & Station: normal Patient leans: N/A  Psychiatric Specialty Exam:  Presentation  General Appearance: Disheveled  Eye Contact:Poor  Speech:Normal Rate  Speech Volume:Normal  Handedness:Right   Mood and Affect  Mood:Anxious  Affect:Full Range   Thought Process  Thought Processes:Linear  Descriptions of Associations:Intact  Orientation:Full (Time, Place and  Person)  Thought Content:Paranoid Ideation  History of Schizophrenia/Schizoaffective disorder:No  Duration of Psychotic Symptoms:Greater than six months  Hallucinations:Hallucinations: None  Ideas of Reference:Paranoia; Delusions  Suicidal Thoughts:Suicidal Thoughts: No  Homicidal Thoughts:Homicidal Thoughts: No   Sensorium  Memory:Immediate Fair; Recent Fair; Remote Fair  Judgment:Impaired  Insight:Lacking   Executive Functions  Concentration:Fair  Attention Span:Fair  Recall:Good  Fund of Knowledge:Fair  Language:Good   Psychomotor Activity  Psychomotor Activity:Psychomotor Activity: Normal   Assets  Assets:Communication Skills; Desire for Improvement; Financial Resources/Insurance; Housing; Intimacy; Social Support   Sleep  Sleep:Sleep: Poor    Physical Exam: Physical Exam Vitals reviewed.  Constitutional:      General: He is not in acute distress. Neurological:     Mental Status: He is alert.    Review of Systems  Psychiatric/Behavioral:  The patient is nervous/anxious and has insomnia.    Blood pressure 124/69, pulse 62, temperature 98.3 F (36.8 C), temperature source Oral, resp. rate 16, height 5' 10.87" (1.8 m), weight 72.1 kg, SpO2 100 %. Body mass index is 22.26 kg/m.   Treatment Plan Summary: Daily contact with patient to assess and evaluate symptoms and progress in treatment   ASSESSMENT:   Diagnoses / Active Problems: -Brief psychotic d/o vs schizophrenia (pt has documented h/o paranoia since 2021). UDS negative this admission (r/o substance induced psychotic disorder) -Eczema by history   PLAN: Safety and Monitoring:             --  Voluntary admission to inpatient psychiatric unit for safety, stabilization and treatment. Currently voluntary status.  I will initiate the IVC process, as the patient is psychotic, disorganized, and not able to rationally manipulate information.             -- Daily contact with patient to  assess and evaluate symptoms and progress in treatment             -- Patient's case to be discussed in multi-disciplinary team meeting             --  Observation Level : q15 minute checks             -- Vital signs:  q12 hours             -- Precautions: suicide, elopement, and assault   2. Psychiatric Diagnoses and Treatment:               -Increase Risperdal from 1 mg in the morning and 2 mg at bedtime -> to 2 mg twice daily  - for psychosis.  -Start Cogentin 0.5 mg twice daily for EPS prophylaxis -Continue agitation protocol with Zyprexa p.o., Ativan p.o., and Geodon IM -Continue trazodone 50 mg nightly as needed -Continue hydroxyzine 25 mg as needed for anxiety and itching -Continue home hydrocortisone cream for eczema.   We will consult internal medicine for severity of eczema, and other treatment options  --  The risks/benefits/side-effects/alternatives to this medication were discussed in detail with the patient and time was given for questions. The patient consents to medication trial.                -- Metabolic profile and EKG monitoring obtained while on an atypical antipsychotic (BMI: Lipid Panel: HbgA1c: QTc:)              -- Encouraged patient to participate in unit milieu and in scheduled group therapies              -- Short Term Goals: Ability to identify changes in lifestyle to reduce recurrence of condition will improve, Ability to verbalize feelings will improve, Ability to disclose and discuss suicidal ideas, Ability to demonstrate self-control will improve, Ability to identify and develop effective coping behaviors will improve, Ability to maintain clinical measurements within normal limits will improve, Compliance with prescribed medications will improve, and Ability to identify triggers associated with substance abuse/mental health issues will improve             -- Long Term Goals: Improvement in symptoms so as ready for discharge                3. Medical Issues  Being Addressed:              Eczema - IM consult - recommended continue home regimen of hydrocortisone cream and see dermatologist as outpatient.    4. Discharge Planning:              -- Social work and case management to assist with discharge planning and identification of hospital follow-up needs prior to discharge             -- Estimated LOS: 5-7 days             -- Discharge Concerns: Need to establish a safety plan; Medication compliance and effectiveness             -- Discharge Goals: Return home with outpatient referrals for mental health follow-up including medication management/psychotherapy     Christoper Allegra, MD 09/10/2021, 12:41 PM  Total Time Spent in Direct Patient Care:  I personally spent 35 minutes on the unit in direct patient care. The direct patient care time included face-to-face time with the patient, reviewing the patient's chart, communicating with other professionals, and coordinating care. Greater than 50% of this time was spent in counseling or coordinating care with the patient regarding goals of hospitalization, psycho-education, and discharge planning needs.   Janine Limbo, MD Psychiatrist

## 2021-09-10 NOTE — Progress Notes (Signed)
The patient rated his day as a 9 out of 10. He states that he accomplished his goal which was to "open up more" to his peers and the staff.

## 2021-09-10 NOTE — BH IP Treatment Plan (Signed)
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[place])   Current Medications:  Current Facility-Administered Medications  Medication Dose Route Frequency Provider Last Rate Last Admin   acetaminophen (TYLENOL) tablet 650 mg  650 mg Oral Q6H PRN Onuoha, Chinwendu V, NP       alum & mag hydroxide-simeth (MAALOX/MYLANTA) 200-200-20 MG/5ML suspension 30 mL  30 mL Oral Q4H PRN Onuoha, Chinwendu V, NP       hydrocortisone cream 1 %   Topical TID Janine Limbo, MD   Given at 09/10/21 0746   hydrOXYzine (ATARAX) tablet 25 mg  25 mg Oral TID PRN Janine Limbo, MD   25 mg at 09/09/21 2047   OLANZapine zydis (ZYPREXA) disintegrating tablet 5 mg  5 mg Oral Q8H PRN Massengill, Ovid Curd, MD       And   LORazepam (ATIVAN) tablet 1 mg  1 mg Oral Q8H PRN Massengill, Nathan, MD       And   ziprasidone (GEODON) injection 20 mg  20 mg Intramuscular Q8H PRN Massengill, Nathan, MD       magnesium hydroxide (MILK OF MAGNESIA) suspension 30 mL  30 mL Oral Daily PRN Onuoha, Chinwendu V, NP       risperiDONE (RISPERDAL M-TABS) disintegrating tablet 1 mg  1 mg Oral Daily Massengill, Nathan, MD   1 mg at 09/10/21 0746   risperiDONE (RISPERDAL M-TABS) disintegrating tablet 2 mg  2 mg Oral QHS Massengill, Nathan, MD   2 mg at 09/09/21 2047   traZODone (DESYREL) tablet 50 mg  50 mg Oral QHS PRN Onuoha, Chinwendu V, NP   50 mg at 09/10/21 0329   white petrolatum (VASELINE) gel            PTA Medications: Medications Prior to Admission  Medication Sig Dispense Refill Last Dose   hydrocortisone cream 1 % Apply 1 Application topically 2 (two) times daily as needed for itching.      Multiple Vitamin (MULTIVITAMIN WITH MINERALS) TABS tablet Take 1 tablet by mouth daily.        Patient Stressors: Financial difficulties   Health problems   Occupational concerns   Substance abuse    Patient Strengths: Average or above average intelligence  Supportive family/friends  Work skills   Treatment Modalities: Medication Management, Group therapy, Case management,  1 to 1 session with clinician, Psychoeducation, Recreational therapy.   Physician Treatment Plan for Primary Diagnosis: Brief psychotic disorder ([place]) Long Term Goal(s): Improvement in symptoms so as ready for discharge   Short Term Goals: Ability to identify changes in lifestyle to reduce recurrence of condition will improve Ability to verbalize feelings will improve Ability to disclose and discuss suicidal ideas Ability to demonstrate self-control will improve Ability to identify and develop effective coping behaviors will improve Ability to maintain clinical measurements within normal limits will improve Compliance with prescribed medications will improve Ability to identify triggers associated with substance abuse/mental health issues will improve  Medication Management: Evaluate patient's response, side effects, and tolerance of medication regimen.  Therapeutic Interventions: 1 to 1 sessions, Unit Group sessions and Medication administration.  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[place])  Long Term Goal(s): Improvement in symptoms so as  ready for discharge   Short Term Goals: Ability to identify changes in lifestyle to reduce recurrence of condition will improve Ability to verbalize feelings will improve Ability to disclose and discuss suicidal ideas Ability to demonstrate self-control will improve Ability to identify and develop effective coping behaviors will improve Ability to maintain clinical measurements within normal limits will improve Compliance  with prescribed medications will improve Ability to identify triggers associated with substance abuse/mental health issues will improve     Medication Management: Evaluate patient's response, side effects, and tolerance of medication regimen.  Therapeutic Interventions: 1 to 1 sessions, Unit Group sessions and Medication administration.  Evaluation of Outcomes: Not Met   RN Treatment Plan for Primary Diagnosis: Brief psychotic disorder ([place]) Long Term Goal(s): Knowledge of disease and therapeutic regimen to maintain health will improve  Short Term Goals: Ability to remain free from injury will improve, Ability to participate in decision making will improve, Ability to verbalize feelings will improve, Ability to disclose and discuss suicidal ideas, and Ability to identify and develop effective coping behaviors will improve  Medication Management: RN will administer medications as ordered by provider, will assess and evaluate patient's response and provide education to patient for prescribed medication. RN will report any adverse and/or side effects to prescribing provider.  Therapeutic Interventions: 1 on 1 counseling sessions, Psychoeducation, Medication administration, Evaluate responses to treatment, Monitor vital signs and CBGs as ordered, Perform/monitor CIWA, COWS, AIMS and Fall Risk screenings as ordered, Perform wound care treatments as ordered.  Evaluation of Outcomes: Not Met   LCSW Treatment Plan for Primary Diagnosis: Brief psychotic disorder [facility]) Long Term Goal(s): Safe transition to appropriate next level of care at discharge, Engage patient in therapeutic group addressing interpersonal concerns.  Short Term Goals: Engage patient in aftercare planning with referrals and resources, Increase social support, Increase emotional regulation, Facilitate acceptance of mental health diagnosis and concerns, Identify triggers associated with mental health/substance abuse issues, and  Increase skills for wellness and recovery  Therapeutic Interventions: Assess for all discharge needs, 1 to 1 time with Social worker, Explore available resources and support systems, Assess for adequacy in community support network, Educate family and significant other(s) on suicide prevention, Complete Psychosocial Assessment, Interpersonal group therapy.  Evaluation of Outcomes: Not Met   Progress in Treatment: Attending groups: Yes. Participating in groups: Yes. Taking medication as prescribed: Yes. Toleration medication: Yes. Family/Significant other contact made: Yes, individual(s) contacted:  Sister Patient understands diagnosis: No. Discussing patient identified problems/goals with staff: Yes. Medical problems stabilized or resolved: Yes. Denies suicidal/homicidal ideation: Yes. Issues/concerns per patient self-inventory: No.   New problem(s) identified: No, Describe:  None   New Short Term/Long Term Goal(s): medication stabilization, elimination of SI thoughts, development of comprehensive mental wellness plan.   Patient Goals: "To set boundaries and work on my mental health"  Discharge Plan or Barriers: Patient recently admitted. CSW will continue to follow and assess for appropriate referrals and possible discharge planning.   Reason for Continuation of Hospitalization: Anxiety Delusions  Medication stabilization  Estimated Length of Stay: 3 to 7 days   Last 3 [place] Suicide Severity Risk Score: Flowsheet Row Admission (Current) from 09/09/2021 in [place] [facility] ED from 09/08/2021 in [facility] ED from 08/10/2021 in [facility] [place] No Risk No Risk No Risk       Last [facility] 2/9 Scores:    11/19/2016    2:37 PM  Depression screen  PHQ 2/9  Decreased Interest 3  Down, Depressed, Hopeless 0  PHQ - 2 Score 3  Altered sleeping 1  Tired, decreased energy 0  Change  in appetite 0  Feeling bad or failure about yourself  0  Trouble concentrating 0  Moving slowly or fidgety/restless 0  Suicidal thoughts 0  PHQ-9 Score 4    Scribe for Treatment Team: Carney Harder 09/10/2021 11:31 AM

## 2021-09-10 NOTE — Group Note (Signed)
Recreation Therapy Group Note   Group Topic:Leisure Education  Group Date: 09/10/2021 Start Time: 1008 End Time: 1045 Facilitators: Caroll Rancher, LRT,CTRS Location: 500 Hall Dayroom   Goal Area(s) Addresses:  Patient will successfully identify positive leisure and recreation activities.  Patient will acknowledge benefits of participation in healthy leisure activities post discharge.  Patient will actively work with peers toward a shared goal.   Group Description:  Pictionary. In groups of 5-7, patients took turns trying to guess the picture being drawn on the board by their teammate.  If the team guessed the correct answer, they won a point.  If the team guessed wrong, the other team got a chance to steal the point. After several rounds of game play, the team with the most points were declared winners. Post-activity discussion reviewed benefits of positive recreation outlets: reducing stress, improving coping mechanisms, increasing self-esteem, and building larger support systems.   Affect/Mood: Appropriate   Participation Level: Engaged   Participation Quality: Independent   Behavior: Appropriate   Speech/Thought Process: Focused   Insight: Good   Judgement: Good   Modes of Intervention: Game   Patient Response to Interventions:  Engaged   Education Outcome:  Acknowledges education and In group clarification offered    Clinical Observations/Individualized Feedback: Pt was bright and attentive during group.  Pt took multiple guesses at trying to figure out what peers had drown on the board.  Pt also did a good job in drawing his clues on the board as well.  Pt identified fishing as something he does for leisure.     Plan: Continue to engage patient in RT group sessions 2-3x/week.   Caroll Rancher, LRT,CTRS 09/10/2021 2:18 PM

## 2021-09-10 NOTE — Progress Notes (Signed)
   09/10/21 1000  Psych Admission Type (Psych Patients Only)  Admission Status Voluntary  Psychosocial Assessment  Patient Complaints Anxiety  Eye Contact Fair  Facial Expression Anxious  Affect Anxious  Speech Rapid  Interaction Assertive  Motor Activity Fidgety  Appearance/Hygiene Unremarkable  Behavior Characteristics Cooperative;Anxious  Mood Anxious;Pleasant  Thought Process  Coherency Concrete thinking  Content Paranoia  Delusions None reported or observed  Perception WDL  Hallucination None reported or observed  Judgment Impaired  Confusion None  Danger to Self  Current suicidal ideation? Denies  Agreement Not to Harm Self Yes  Description of Agreement Verbal  Danger to Others  Danger to Others None reported or observed  Danger to Others Abnormal  Harmful Behavior to others No threats or harm toward other people  Destructive Behavior No threats or harm toward property

## 2021-09-10 NOTE — BHH Counselor (Addendum)
CSW called patient sister back after voicemail left wanting information on patient progress.  CSW left voicemail for a call back.   Addendum: sister called back and she shared that she planned to visit tonight.  She reminded social worker that patient shared with her that he felt like all this started happening about a year ago when he may have smoked marijuana that was laced with something.  He also shared with sister concerns about mom's boyfriend and sisters husband and to leave those relationships for their safety.    Larry Petter, LCSW, LCAS Clincal Social Worker  [facility]

## 2021-09-11 DIAGNOSIS — F23 Brief psychotic disorder: Secondary | ICD-10-CM

## 2021-09-11 DIAGNOSIS — F29 Unspecified psychosis not due to a substance or known physiological condition: Secondary | ICD-10-CM | POA: Diagnosis present

## 2021-09-11 LAB — CBC WITH DIFFERENTIAL/PLATELET
Abs Immature Granulocytes: 0.01 10*3/uL (ref 0.00–0.07)
Basophils Absolute: 0 10*3/uL (ref 0.0–0.1)
Basophils Relative: 1 %
Eosinophils Absolute: 0.6 10*3/uL — ABNORMAL HIGH (ref 0.0–0.5)
Eosinophils Relative: 17 %
HCT: 43.8 % (ref 39.0–52.0)
Hemoglobin: 14.4 g/dL (ref 13.0–17.0)
Immature Granulocytes: 0 %
Lymphocytes Relative: 22 %
Lymphs Abs: 0.8 10*3/uL (ref 0.7–4.0)
MCH: 30.5 pg (ref 26.0–34.0)
MCHC: 32.9 g/dL (ref 30.0–36.0)
MCV: 92.8 fL (ref 80.0–100.0)
Monocytes Absolute: 0.3 10*3/uL (ref 0.1–1.0)
Monocytes Relative: 10 %
Neutro Abs: 1.7 10*3/uL (ref 1.7–7.7)
Neutrophils Relative %: 50 %
Platelets: 244 10*3/uL (ref 150–400)
RBC: 4.72 MIL/uL (ref 4.22–5.81)
RDW: 12.4 % (ref 11.5–15.5)
WBC: 3.4 10*3/uL — ABNORMAL LOW (ref 4.0–10.5)
nRBC: 0 % (ref 0.0–0.2)

## 2021-09-11 LAB — VITAMIN B12: Vitamin B-12: 834 pg/mL (ref 180–914)

## 2021-09-11 LAB — HIV ANTIBODY (ROUTINE TESTING W REFLEX): HIV Screen 4th Generation wRfx: NONREACTIVE

## 2021-09-11 LAB — SEDIMENTATION RATE: Sed Rate: 4 mm/hr (ref 0–16)

## 2021-09-11 NOTE — Progress Notes (Signed)
   09/11/21 2025  Psych Admission Type (Psych Patients Only)  Admission Status Voluntary  Psychosocial Assessment  Patient Complaints None  Eye Contact Fair  Facial Expression Anxious  Affect Anxious  Speech Logical/coherent  Interaction Assertive  Motor Activity Other (Comment) (wnl)  Appearance/Hygiene Unremarkable  Behavior Characteristics Cooperative  Mood Pleasant  Thought Process  Coherency Concrete thinking  Content WDL  Delusions None reported or observed  Perception WDL  Hallucination None reported or observed  Judgment Impaired  Confusion None  Danger to Self  Current suicidal ideation? Denies  Danger to Others  Danger to Others None reported or observed   Progress note   D: Pt seen in dayroom. Pt denies SI, HI, AVH. Pt rates pain  0/10. Pt rates anxiety  0/10 and depression  0/10. Pt attended group tonight and endorses group attendance during the day. Pt says his sleep and appetite are good. Did say that sleep was sometimes interrupted by the door opening a lot during checks. Pt encouraged to prop door open to facilitate better sleep and so checks can still be done satisfactorily. Pt asked about setting up his discharge plan. Pt encouraged to reach out to his social worker in the morning. Pt verbalized understanding.  A: Pt provided support and encouragement. Pt given scheduled medication as prescribed. PRNs as appropriate. Q15 min checks for safety.   R: Pt safe on the unit. Will continue to monitor.

## 2021-09-11 NOTE — Group Note (Signed)
Date:  09/11/2021 Time:  4:19 PM  Group Topic/Focus:  Coping With Mental Health Crisis:   The purpose of this group is to help patients identify strategies for coping with mental health crisis.  Group discusses possible causes of crisis and ways to manage them effectively. Crisis Planning:   The purpose of this group is to help patients create a crisis plan for use upon discharge or in the future, as needed. Making Healthy Choices:   The focus of this group is to help patients identify negative/unhealthy choices they were using prior to admission and identify positive/healthier coping strategies to replace them upon discharge. Recovery Goals:   The focus of this group is to identify appropriate goals for recovery and establish a plan to achieve them. Relapse Prevention Planning:   The focus of this group is to define relapse and discuss the need for planning to combat relapse. Self Care:   The focus of this group is to help patients understand the importance of self-care in order to improve or restore emotional, physical, spiritual, interpersonal, and financial health.    Participation Level:  Active  Participation Quality:  Appropriate, Attentive, and Sharing  Affect:  Appropriate  Cognitive:  Alert, Appropriate, and Oriented  Insight: Good  Engagement in Group:  Engaged  Modes of Intervention:  Discussion and Education  [employer], OT   Ted Mcalpine 09/11/2021, 4:19 PM

## 2021-09-12 ENCOUNTER — Ambulatory Visit (HOSPITAL_COMMUNITY)
Admission: AD | Admit: 2021-09-12 | Discharge: 2021-09-12 | Disposition: A | Discharge status: Home / Self Care | Payer: No Typology Code available for payment source | Source: Intra-hospital | Attending: Emergency Medicine | Admitting: Emergency Medicine

## 2021-09-12 NOTE — BHH Counselor (Signed)
Sister left CSW voicemail and discussed that she visited the previous night and that it was a great visit.  Sister reports that he was able to explain some work that he had been doing in groups and that he had been having great interactions with staff and patients on the unit. Patient was asking for fiance to visit and sister wanted opinion about if that was a good idea in his mental state at this time. CSW plans to call sister back.    Priya Matsen, LCSW, LCAS Clincal Social Worker  [facility]

## 2021-09-12 NOTE — Progress Notes (Signed)
Adult Psychoeducational Group Note  Date:  09/12/2021 Time:  9:37 PM  Group Topic/Focus:  Wrap-Up Group:   The focus of this group is to help patients review their daily goal of treatment and discuss progress on daily workbooks.  Participation Level:  Active  Participation Quality:  Appropriate and Attentive  Affect:  Appropriate  Cognitive:  Appropriate  Insight: Appropriate  Engagement in Group:  Engaged  Modes of Intervention:  Discussion  Additional Comments:   Pt was engaged during group discussion. Pt states that he had a good day and was excited about the time that he got to spend in the gym and socializing. Pt states that he was happy that he got to have a visit with his fiance. Pt denies everything and states that one of his goals for tonight was to get good sleep.  Vevelyn Pat 09/12/2021, 9:37 PM

## 2021-09-12 NOTE — Progress Notes (Signed)
[facility] MD Progress Note  09/13/2021 11:22 AM Larry Simmons  MRN:  [id number]  Chief Complaint: paranoia and delusions  Reason for Admission:  Larry Simmons is a 31 y.o. male with an unclear past psychiatric history who was initially admitted for inpatient psychiatric hospitalization on 09/09/2021 for management of paranoia and delusions. The patient is currently on Hospital Day 4.   Chart Review from last 24 hours:  The patient's chart was reviewed and nursing notes were reviewed. The patient's case was discussed in multidisciplinary team meeting.Per nursing he attended all but chaplain group. He had no acute behavioral issues or safety concerns noted.  Per MAR he was compliant with scheduled medications and did receive Vistaril X1 yesterday.  Information Obtained Today During Patient Interview: The patient was seen and evaluated on the unit. On assessment today the patient reports that he slept fair overnight and has been eating well.  He is a light sleeper and states that room checks cause him to awaken at night.  He denies any paranoia, ideas of reference, first rank symptoms, AVH, SI or HI.  He specifically denies seeing or hearing angels or believing that his family or fianc are conspiring against him and watching him.He denies any medication side effects and voices no physical complaints today.  He is aware that an MRI of his brain is scheduled and is scheduled for later today.  He states that he is showering, going to groups and attending to his ADLs.  Principal Problem: Schizophrenia spectrum disorder with psychotic disorder type not yet determined (HCC) Diagnosis: Principal Problem:   Schizophrenia spectrum disorder with psychotic disorder type not yet determined (HCC)  Total Time Spent in Direct Patient Care:  I personally spent 30 minutes on the unit in direct patient care. The direct patient care time included face-to-face time with the patient, reviewing the patient's chart,  communicating with other professionals, and coordinating care. Greater than 50% of this time was spent in counseling or coordinating care with the patient regarding goals of hospitalization, psycho-education, and discharge planning needs.  Past Psychiatric History: see H&P  Past Medical History:  Past Medical History:  Diagnosis Date   Asthma    Eczema     Family History: see H&P  Family Psychiatric  History: Per social work, sister stated that mother has schizophrenia.  Social History:  Social History   Substance and Sexual Activity  Alcohol Use Yes   Comment: occassionl     Social History   Substance and Sexual Activity  Drug Use Yes   Types: Marijuana    Social History   Socioeconomic History   Marital status: Single    Spouse name: Not on file   Number of children: 0   Years of education: Not on file   Highest education level: High school graduate  Occupational History   Occupation: Social research officer, government     Comment: Museum/gallery curator  Tobacco Use   Smoking status: Every Day    Packs/day: 0.25    Types: Cigarettes   Smokeless tobacco: Never   Tobacco comments:    Interested in nicorette gum, will be ordered.  Substance and Sexual Activity   Alcohol use: Yes    Comment: occassionl   Drug use: Yes    Types: Marijuana   Sexual activity: Yes  Other Topics Concern   Not on file  Social History Narrative   Not on file   Social Determinants of Health   Financial Resource Strain: Not on file  Food  Insecurity: Not on file  Transportation Needs: Not on file  Physical Activity: Not on file  Stress: Not on file  Social Connections: Not on file    Sleep: Fair  Appetite:   Stable  Current Medications: Current Facility-Administered Medications  Medication Dose Route Frequency Provider Last Rate Last Admin   acetaminophen (TYLENOL) tablet 650 mg  650 mg Oral Q6H PRN Onuoha, Chinwendu V, NP       alum & mag hydroxide-simeth (MAALOX/MYLANTA) 200-200-20 MG/5ML  suspension 30 mL  30 mL Oral Q4H PRN Onuoha, Chinwendu V, NP       benztropine (COGENTIN) tablet 0.5 mg  0.5 mg Oral BID Massengill, Harrold Donath, MD   0.5 mg at 09/13/21 0739   gadobutrol (GADAVIST) 1 MMOL/ML injection 7 mL  7 mL Intravenous Once PRN Comer Locket, MD       hydrocerin (EUCERIN) cream   Topical BID Phineas Inches, MD   Given at 09/13/21 1610   hydrocortisone cream 1 %   Topical TID Phineas Inches, MD   Given at 09/13/21 0739   hydrOXYzine (ATARAX) tablet 25 mg  25 mg Oral TID PRN Phineas Inches, MD   25 mg at 09/12/21 2108   OLANZapine zydis (ZYPREXA) disintegrating tablet 5 mg  5 mg Oral Q8H PRN Massengill, Harrold Donath, MD       And   LORazepam (ATIVAN) tablet 1 mg  1 mg Oral Q8H PRN Massengill, Harrold Donath, MD       And   ziprasidone (GEODON) injection 20 mg  20 mg Intramuscular Q8H PRN Massengill, Nathan, MD       magnesium hydroxide (MILK OF MAGNESIA) suspension 30 mL  30 mL Oral Daily PRN Onuoha, Chinwendu V, NP       risperiDONE (RISPERDAL M-TABS) disintegrating tablet 2 mg  2 mg Oral QHS Massengill, Nathan, MD   2 mg at 09/12/21 2108   risperiDONE (RISPERDAL M-TABS) disintegrating tablet 2 mg  2 mg Oral Daily Massengill, Nathan, MD   2 mg at 09/13/21 9604   traZODone (DESYREL) tablet 50 mg  50 mg Oral QHS Massengill, Harrold Donath, MD   50 mg at 09/12/21 2108    Lab Results:  Results for orders placed or performed during the hospital encounter of 09/09/21 (from the past 48 hour(s))  Sedimentation rate     Status: None   Collection Time: 09/11/21  6:23 PM  Result Value Ref Range   Sed Rate 4 0 - 16 mm/hr    Comment: Performed at [facility], [address], [place], [place] [address]  Ceruloplasmin     Status: None   Collection Time: 09/11/21  6:23 PM  Result Value Ref Range   Ceruloplasmin 21.8 16.0 - 31.0 mg/dL    Comment: (NOTE) Performed At: [facility] [address] [place], [place] [id number] Jolene Schimke MD FA:[id number]   ANA  w/Reflex if Positive     Status: None   Collection Time: 09/11/21  6:23 PM  Result Value Ref Range   Anti Nuclear Antibody (ANA) Negative Negative    Comment: (NOTE) Performed At: [facility] [address] [place], [place] [id number] Jolene Schimke MD [id number]   HIV Antibody (routine testing w rflx)     Status: None   Collection Time: 09/11/21  6:23 PM  Result Value Ref Range   HIV Screen 4th Generation wRfx Non Reactive Non Reactive    Comment: Performed at [facility], [address], [place], [place] [address]  Vitamin B12  Status: None   Collection Time: 09/11/21  6:23 PM  Result Value Ref Range   Vitamin B-12 834 180 - 914 pg/mL    Comment: (NOTE) This assay is not validated for testing neonatal or myeloproliferative syndrome specimens for Vitamin B12 levels. Performed at [facility], [address], [place], [place] [address]   CBC with Differential/Platelet     Status: Abnormal   Collection Time: 09/11/21  6:23 PM  Result Value Ref Range   WBC 3.4 (L) 4.0 - 10.5 K/uL   RBC 4.72 4.22 - 5.81 MIL/uL   Hemoglobin 14.4 13.0 - 17.0 g/dL   HCT 60.4 54.0 - 98.1 %   MCV 92.8 80.0 - 100.0 fL   MCH 30.5 26.0 - 34.0 pg   MCHC 32.9 30.0 - 36.0 g/dL   RDW 19.1 47.8 - 29.5 %   Platelets 244 150 - 400 K/uL   nRBC 0.0 0.0 - 0.2 %   Neutrophils Relative % 50 %   Neutro Abs 1.7 1.7 - 7.7 K/uL   Lymphocytes Relative 22 %   Lymphs Abs 0.8 0.7 - 4.0 K/uL   Monocytes Relative 10 %   Monocytes Absolute 0.3 0.1 - 1.0 K/uL   Eosinophils Relative 17 %   Eosinophils Absolute 0.6 (H) 0.0 - 0.5 K/uL   Basophils Relative 1 %   Basophils Absolute 0.0 0.0 - 0.1 K/uL   Immature Granulocytes 0 %   Abs Immature Granulocytes 0.01 0.00 - 0.07 K/uL    Comment: Performed at [facility], [address], [place], [place] [address]     Blood Alcohol level:  Lab Results  Component Value Date   [facility] <10 09/06/2021   ETH <10 02/10/2020     Metabolic Disorder Labs: Lab Results  Component Value Date   HGBA1C 5.2 09/06/2021   MPG 102.54 09/06/2021   MPG 108.28 02/10/2020   Lab Results  Component Value Date   PROLACTIN 3.2 (L) 09/08/2021   PROLACTIN 4.3 02/10/2020   Lab Results  Component Value Date   CHOL 211 (H) 09/08/2021   TRIG 49 09/08/2021   HDL 102 09/08/2021   CHOLHDL 2.1 09/08/2021   VLDL 10 09/08/2021   LDLCALC 99 09/08/2021   LDLCALC 102 (H) 09/06/2021    Physical Findings: AIMS: Facial and Oral Movements Muscles of Facial Expression: None, normal Lips and Perioral Area: None, normal Jaw: None, normal Tongue: None, normal,Extremity Movements Upper (arms, wrists, hands, fingers): None, normal Lower (legs, knees, ankles, toes): None, normal, Trunk Movements Neck, shoulders, hips: None, normal, Overall Severity Severity of abnormal movements (highest score from questions above): None, normal Incapacitation due to abnormal movements: None, normal Patient's awareness of abnormal movements (rate only patient's report): No Awareness, Dental Status Current problems with teeth and/or dentures?: No Does patient usually wear dentures?: No    Musculoskeletal: Strength & Muscle Tone: within normal limits Gait & Station: normal Patient leans: N/A  Psychiatric Specialty Exam:  Presentation  General Appearance: Casually dressed, fair hygiene, extensive evidence of eczema on skin, appears stated age  Eye Contact:Fair  Speech:Normal Rate  Speech Volume:Normal  Mood and Affect  Mood:- described as improved - appears mildly anxious  Affect:Constricted   Thought Process  Thought Processes: Linear and superficially goal directed  Descriptions of Associations:Intact  Orientation:grossly oriented to self, place and time  Thought Content:Denies paranoia; denies current AVH, ideas of reference, first rank symptoms, or SI/HI - is not grossly responding to internal/external stimuli on  exam  Hallucinations:Denied  Ideas of Reference:Denied   Suicidal Thoughts:Suicidal Thoughts: No  Homicidal Thoughts:Homicidal Thoughts: No   Sensorium  Memory:Immediate Fair; Recent Fair; Remote Fair  Judgment:Fair - taking meds  Insight:Shallow   Executive Functions  Concentration:Fair  Attention Span:Fair  Recall:Fair  Fund of Knowledge:Fair  Language:Good   Psychomotor Activity  Psychomotor Activity:Psychomotor Activity: Normal   Assets  Assets:Communication Skills; Desire for Improvement; Financial Resources/Insurance; Housing; Social Support   Sleep  6.25 hours   Physical Exam Vitals reviewed.  Constitutional:      General: He is not in acute distress. HENT:     Head: Normocephalic.  Pulmonary:     Effort: Pulmonary effort is normal.  Skin:    Comments: Extensive signs of eczema   Neurological:     General: No focal deficit present.     Mental Status: He is alert.    Review of Systems  Respiratory:  Negative for shortness of breath.   Cardiovascular:  Negative for chest pain.  Gastrointestinal:  Negative for diarrhea, nausea and vomiting.  Neurological:  Negative for headaches.  Psychiatric/Behavioral:  Negative for hallucinations and suicidal ideas. The patient does not have insomnia.    Blood pressure 110/80, pulse 92, temperature 97.8 F (36.6 C), temperature source Oral, resp. rate 16, height 5' 10.87" (1.8 m), weight 72.1 kg, SpO2 100 %. Body mass index is 22.26 kg/m.   Treatment Plan Summary: Diagnoses / Active Problems: -Unspecified schizophrenia spectrum and other psychotic d/o  -Eczema by history - Abnormal Head CT - Chronic leukopenia   PLAN: Safety and Monitoring:             --  Converted to Involuntary admission to inpatient psychiatric unit for safety, stabilization and treatment.               -- Daily contact with patient to assess and evaluate symptoms and progress in treatment             -- Patient's case to be  discussed in multi-disciplinary team meeting             -- Observation Level : q15 minute checks             -- Vital signs:  q12 hours             -- Precautions: suicide, elopement, and assault   2. Psychiatric Diagnoses and Treatment:  Unspecified schizophrenia spectrum and other psychotic d/o           --Continue Risperdal 2 twice daily- for psychosis.(A1c 5.2, lipids WNL except for cholesterol 211, QTC) --Continue Cogentin 0.5 mg twice daily for EPS prophylaxis --Continue agitation protocol with Zyprexa p.o., Ativan p.o., and Geodon IM --Continue trazodone 50 mg nightly as needed --Continue hydroxyzine 25 mg as needed for anxiety and itching -- Medical w/u for new onset psychosis to r/o organic etiology to symptoms: (RPR negative, TSH 1.974, UDS negative,CMP WNL except for glucose 109, alcohol <10, WBC 3.5 with ANC 2300, H/H 15.2/46.1 and platelets 258, B12 WNL,HIV nonreactive, ESR 4, ceruloplasmin 21.8; Head CT shows mild asymmetric enlargement of the right temporal horn of the lateral ventricle - question if secondary to focal volume loss of the medial right temporal lobe) B1 pending, heavy metal pending, , ANA pending pending; MRI brain Pending -- Encouraged patient to participate in unit milieu and in scheduled group therapies  3. Medical Issues Being Addressed:              Eczema  -- IM consulted - recommended continue home regimen of hydrocortisone cream and see dermatologist as outpatient.    Leukopenia  -- WBC appears chronically low compared to WBC 1 year ago - will need f/u with PCP after discharge - currently asymptomatic  -- Repeat CBC shows WBC 3.4 with ANC 1700 - will need monitoring while on an atypical antipsychotic   Abnormal Head CT - Per radiology: Mild asymmetric enlargement of the right temporal horn of the lateral ventricle. Question if secondary to focal volume loss of the medial right temporal lobe, no surrounding edema or  transependymal extravasation of CSF. The brain is otherwise normal in CT appearance.  -- MRI of brain recommended for further evaluation and ordered  -- Currently has no focal neurologic deficits on exam   4. Discharge Planning:              -- Social work and case management to assist with discharge planning and identification of hospital follow-up needs prior to discharge             -- Discharge Concerns: Need to establish a safety plan; Medication compliance and effectiveness             -- Discharge Goals: Return home with outpatient referrals for mental health follow-up including medication management/psychotherapy     Comer Locket, MD, FAPA 09/13/2021, 11:22 AM

## 2021-09-12 NOTE — Plan of Care (Addendum)
I was notified by radiology with called report that his head CT shows mild asymmetric enlargement  of the right temporal horn of lateral ventricle questionably secondary to focal volume loss in medial right temporal lobe. No edema - MRI recommended for further w/u.   MRI ordered and patient notified of findings. Patient denies having implanted devices or metal in body. He denies HA, dizziness, gait issues, or focal neurologic deficits. On physical exam, gait is steady, intact finger to nose, 5/5 UE strength and equal grip, CN 3-12 grossly intact.   Bartholomew Crews, MD, Celene Skeen

## 2021-09-12 NOTE — [facility] Group Notes (Signed)

## 2021-09-13 ENCOUNTER — Inpatient Hospital Stay (HOSPITAL_COMMUNITY): Payer: Medicaid Other

## 2021-09-13 LAB — CERULOPLASMIN: Ceruloplasmin: 21.8 mg/dL (ref 16.0–31.0)

## 2021-09-13 LAB — ANA W/REFLEX IF POSITIVE: Anti Nuclear Antibody (ANA): NEGATIVE

## 2021-09-13 MED ORDER — GADOBUTROL 1 MMOL/ML IV SOLN
7.0000 mL | Freq: Once | INTRAVENOUS | Status: AC | PRN
  Administered 2021-09-13: 7 mL via INTRAVENOUS

## 2021-09-14 MED ORDER — LORAZEPAM 1 MG PO TABS: 1.0000 mg | ORAL_TABLET | Freq: Four times a day (QID) | ORAL | Status: DC | PRN

## 2021-09-14 MED ORDER — TRAZODONE HCL 50 MG PO TABS
50.0000 mg | ORAL_TABLET | Freq: Once | ORAL | Status: AC
Start: 2021-09-14 — End: 2021-09-14
  Administered 2021-09-14: 50 mg via ORAL
  Filled 2021-09-14 (×2): qty 1

## 2021-09-14 MED ORDER — HALOPERIDOL 5 MG PO TABS: 5.0000 mg | ORAL_TABLET | Freq: Every day | ORAL | Status: DC

## 2021-09-14 MED ORDER — LORAZEPAM 2 MG/ML IJ SOLN: 1.0000 mg | Freq: Four times a day (QID) | INTRAMUSCULAR | Status: DC | PRN

## 2021-09-14 MED ORDER — HALOPERIDOL 5 MG PO TABS: 5.0000 mg | ORAL_TABLET | Freq: Four times a day (QID) | ORAL | Status: DC | PRN

## 2021-09-14 MED ORDER — DIPHENHYDRAMINE HCL 25 MG PO CAPS: 25.0000 mg | ORAL_CAPSULE | Freq: Four times a day (QID) | ORAL | Status: DC | PRN

## 2021-09-14 MED ORDER — DIPHENHYDRAMINE HCL 50 MG/ML IJ SOLN: 25.0000 mg | Freq: Four times a day (QID) | INTRAMUSCULAR | Status: DC | PRN

## 2021-09-14 MED ORDER — LORAZEPAM 2 MG/ML IJ SOLN
INTRAMUSCULAR | Status: AC
  Administered 2021-09-14: 2 mg
  Filled 2021-09-14: qty 1

## 2021-09-14 MED ORDER — HALOPERIDOL LACTATE 5 MG/ML IJ SOLN: 5.0000 mg | Freq: Four times a day (QID) | INTRAMUSCULAR | Status: DC | PRN

## 2021-09-14 MED ORDER — HALOPERIDOL LACTATE 5 MG/ML IJ SOLN
5.0000 mg | Freq: Once | INTRAMUSCULAR | Status: AC
  Administered 2021-09-14: 5 mg via INTRAMUSCULAR
  Filled 2021-09-14 (×2): qty 1

## 2021-09-14 MED ORDER — DIPHENHYDRAMINE HCL 50 MG/ML IJ SOLN
25.0000 mg | Freq: Once | INTRAMUSCULAR | Status: AC
  Administered 2021-09-14: 25 mg via INTRAMUSCULAR
  Filled 2021-09-14: qty 1
  Filled 2021-09-14: qty 0.5

## 2021-09-14 MED ORDER — LORAZEPAM 1 MG PO TABS
1.0000 mg | ORAL_TABLET | Freq: Once | ORAL | Status: DC
Start: 2021-09-14 — End: 2021-09-14

## 2021-09-14 MED ORDER — DIPHENHYDRAMINE HCL 25 MG PO CAPS
25.0000 mg | ORAL_CAPSULE | Freq: Once | ORAL | Status: DC
  Filled 2021-09-14: qty 1

## 2021-09-14 MED ORDER — RISPERIDONE 3 MG PO TBDP
3.0000 mg | ORAL_TABLET | Freq: Every day | ORAL | Status: DC
Start: 2021-09-15 — End: 2021-09-17
  Administered 2021-09-15 – 2021-09-16 (×2): 3 mg via ORAL
  Filled 2021-09-14: qty 1
  Filled 2021-09-14: qty 7
  Filled 2021-09-14 (×2): qty 1

## 2021-09-14 MED ORDER — HALOPERIDOL 5 MG PO TABS
5.0000 mg | ORAL_TABLET | Freq: Every day | ORAL | Status: DC
  Administered 2021-09-15 – 2021-09-17 (×3): 5 mg via ORAL
  Filled 2021-09-14: qty 1
  Filled 2021-09-14: qty 7
  Filled 2021-09-14 (×3): qty 1

## 2021-09-14 MED ORDER — RISPERIDONE 2 MG PO TBDP
2.0000 mg | ORAL_TABLET | Freq: Every day | ORAL | Status: AC
  Administered 2021-09-14: 2 mg via ORAL
  Filled 2021-09-14: qty 1

## 2021-09-14 MED ORDER — HALOPERIDOL 5 MG PO TABS
5.0000 mg | ORAL_TABLET | Freq: Once | ORAL | Status: DC
  Filled 2021-09-14: qty 1

## 2021-09-14 MED ORDER — LORAZEPAM 2 MG/ML IJ SOLN: 1.0000 mg | Freq: Once | INTRAMUSCULAR | Status: AC

## 2021-09-14 MED ORDER — RISPERIDONE 1 MG PO TBDP: 1.0000 mg | ORAL_TABLET | Freq: Every day | ORAL | Status: DC

## 2021-09-14 NOTE — Plan of Care (Signed)
Called repeatedly by nursing this after that the patient was wanting to sign a 72 hour request for discharge, became suddenly agitated, and was demanding to leave. Multiple attempts were made to explain to patient that he is no longer voluntary and is under IVC and is not felt to be ready for discharge. He eloped off [address] by following someone off the unit and then attempted to elope from main doors of the unit. He was provided verbal prompts by multiple nurses, mental health techs, NP security, and physician to return to [address] where he was offered the option to continue discussion and use the phone if desired. He was perseverative about having legal issues and a job he needed to attend to and could not process information provided that we could notify the court or his attorney that he is in the hospital to excuse from community service deadline. He was shown a copy of his IVC paperwork but did not comprehend that he is legally being held in the hospital. He did not understand why he is in the hospital and he started making statements that he thinks staff are "trying to find things wrong with him" and that we are keeping him against his will. He was paranoid with staff and physician, did not believe he has any mental heath or neurologic issues on his Head CT/MRI, refused EEG tomorrow and refused for physician to call and update his family on his presentation/labs/imaging. He became tearful, argumentative, and refused to move. He required a brief manual hold by techs and security to escort him back to [address]. He was offered po medications to help him calm down and refused and required emergency IM medication.   Based on worsening paranoia and persecutory delusions about staff/physician this afternoon which appears to be an abrupt change from earlier presentation today and a change from progress noted over recent days, will change Risperdal to 3mg  qhs starting tomorrow night and add scheduled Haldol 5mg  po  in addition.   Larry Crews, MD, Celene Skeen

## 2021-09-14 NOTE — [facility] Group Notes (Signed)
Adult Psychoeducational Group Note Date:  09/14/2021 Time:  0900-1045 Group Topic/Focus: PROGRESSIVE RELAXATION. A group where deep breathing is taught and tensing and relaxation muscle groups is used. Imagery is used as well.  Pts are asked to imagine 3 pillars that hold them up when they are not able to hold themselves up and to share that with the group.  Participation Level:  Active  Participation Quality:  Appropriate  Affect:  Appropriate  Cognitive:  Oriented  Insight: Improving  Engagement in Group:  Engaged  Modes of Intervention:  Activity, Discussion, Education, and Support  Additional Comments:  Rates his energy at a 10/10 States what holds him up is God, family and his fiance.  Dione Housekeeper

## 2021-09-15 ENCOUNTER — Encounter (HOSPITAL_COMMUNITY): Payer: Self-pay

## 2021-09-15 NOTE — Progress Notes (Signed)
Patient alert and oriented x4. Patients thoughts are organized today and he has been compliant with the activities of the day. He was very apologetic about him getting angry yesterday. Denies SI/HI, denies A/V hallucinations. Will continue q3min checks.

## 2021-09-15 NOTE — Progress Notes (Signed)
Adult Psychoeducational Group Note  Date:  09/15/2021 Time:  8:30 PM  Group Topic/Focus:  Wrap-Up Group:   The focus of this group is to help patients review their daily goal of treatment and discuss progress on daily workbooks.  Participation Level:  Active  Participation Quality:  Appropriate  Affect:  Appropriate  Cognitive:  Appropriate  Insight: Appropriate  Engagement in Group:  Developing/Improving  Modes of Intervention:  Discussion  Additional Comments:  Pt stated his goal for today was to focus on his treatment plan. Pt stated he accomplished his goal today. Pt stated he talked with his doctor and social worker about his care today. Pt rated his overall day a 10. Pt stated his thoughts where more clear today. Pt stated he was able to contact his parents, sister and his fiance today which improved his overall day. Pt stated he felt better about himself today. Pt stated staff bought back all his meals today. Pt stated he took all medications provided today.  Pt stated his appetite was pretty good today. Pt rated sleep last night was pretty good. Pt stated the goal tonight was to get some rest. Pt stated he had no physical pain tonight. Pt deny visual hallucinations and auditory issues tonight. Pt denies thoughts of harming himself or others. Pt stated he would alert staff if anything changed  Felipa Furnace 09/15/2021, 8:30 PM

## 2021-09-16 ENCOUNTER — Inpatient Hospital Stay (HOSPITAL_COMMUNITY): Payer: Medicaid Other

## 2021-09-16 LAB — CBC WITH DIFFERENTIAL/PLATELET
Abs Immature Granulocytes: 0.01 K/uL (ref 0.00–0.07)
Basophils Absolute: 0 K/uL (ref 0.0–0.1)
Basophils Relative: 0 %
Eosinophils Absolute: 0.5 K/uL (ref 0.0–0.5)
Eosinophils Relative: 13 %
HCT: 41 % (ref 39.0–52.0)
Hemoglobin: 13.4 g/dL (ref 13.0–17.0)
Immature Granulocytes: 0 %
Lymphocytes Relative: 24 %
Lymphs Abs: 0.9 K/uL (ref 0.7–4.0)
MCH: 30.3 pg (ref 26.0–34.0)
MCHC: 32.7 g/dL (ref 30.0–36.0)
MCV: 92.8 fL (ref 80.0–100.0)
Monocytes Absolute: 0.6 K/uL (ref 0.1–1.0)
Monocytes Relative: 15 %
Neutro Abs: 1.9 K/uL (ref 1.7–7.7)
Neutrophils Relative %: 48 %
Platelets: 219 K/uL (ref 150–400)
RBC: 4.42 MIL/uL (ref 4.22–5.81)
RDW: 12.3 % (ref 11.5–15.5)
WBC: 4 K/uL (ref 4.0–10.5)
nRBC: 0 % (ref 0.0–0.2)

## 2021-09-16 LAB — COMPREHENSIVE METABOLIC PANEL
ALT: 17 U/L (ref 0–44)
AST: 35 U/L (ref 15–41)
Albumin: 3.8 g/dL (ref 3.5–5.0)
Alkaline Phosphatase: 47 U/L (ref 38–126)
Anion gap: 7 (ref 5–15)
BUN: 11 mg/dL (ref 6–20)
CO2: 29 mmol/L (ref 22–32)
Calcium: 9.3 mg/dL (ref 8.9–10.3)
Chloride: 106 mmol/L (ref 98–111)
Creatinine, Ser: 1.04 mg/dL (ref 0.61–1.24)
GFR, Estimated: >60 mL/min (ref >60)
Glucose, Bld: 87 mg/dL (ref 70–99)
Potassium: 3.9 mmol/L (ref 3.5–5.1)
Sodium: 142 mmol/L (ref 135–145)
Total Bilirubin: 0.7 mg/dL (ref 0.3–1.2)
Total Protein: 6.5 g/dL (ref 6.5–8.1)

## 2021-09-16 LAB — VITAMIN B1: Vitamin B1 (Thiamine): 121.2 nmol/L (ref 66.5–200.0)

## 2021-09-16 LAB — HEAVY METALS, BLOOD
Arsenic: 4 ug/L (ref 0–9)
Lead: 1.3 ug/dL (ref 0.0–3.4)
Mercury: <1 ug/L (ref 0.0–14.9)

## 2021-09-16 MED ORDER — TRAZODONE HCL 50 MG PO TABS
50.0000 mg | ORAL_TABLET | Freq: Once | ORAL | Status: AC
  Administered 2021-09-16: 50 mg via ORAL
  Filled 2021-09-16: qty 1
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
## 2021-09-16 NOTE — [facility] Group Notes (Signed)
Adult Orientation Group Note  Date:  09/16/2021 Time:  2:30 PM  Group Topic/Focus:  Orientation:   The focus of this group is to educate the patient on the purpose and policies of crisis stabilization and provide a format to answer questions about their admission.  The group details unit policies and expectations of patients while admitted.  Participation Level:  Active  Participation Quality:  Appropriate  Affect:  Appropriate  Cognitive:  Appropriate  Insight: Appropriate  Engagement in Group:  Engaged  Modes of Intervention:  Education  Samaa Ueda R Brigett Estell 09/16/2021, 2:30 PM 

## 2021-09-16 NOTE — Progress Notes (Signed)
Pt requested to be able to attend his IVC court hearing today.

## 2021-09-16 NOTE — Progress Notes (Signed)
   09/16/21 2030  Psych Admission Type (Psych Patients Only)  Admission Status Involuntary  Psychosocial Assessment  Patient Complaints None  Eye Contact Fair  Facial Expression Flat  Affect Appropriate to circumstance  Speech Logical/coherent  Interaction Assertive  Motor Activity Slow  Appearance/Hygiene Unremarkable  Behavior Characteristics Cooperative;Appropriate to situation  Mood Anxious;Pleasant  Thought Process  Coherency Circumstantial  Content WDL  Delusions None reported or observed  Perception WDL  Hallucination None reported or observed  Judgment Limited  Confusion None  Danger to Self  Current suicidal ideation? Denies  Danger to Others  Danger to Others None reported or observed   Progress note   D: Pt seen at med window. Pt denies SI, HI, AVH. Pt rates pain  0/10. Pt rates anxiety  0/10 and depression  0/10. Pt states that he feels good and has had a positive day. Pt has had CT, MRI and EEG. Pt states he is ready to be discharged. Pt educated on IVC and that he can ask provider to rescinded it and sign voluntary before discharge. All questions answered. Pt attending groups, eating meals and sleeping well. No other complaints noted.  A: Pt provided support and encouragement. Pt given scheduled medication as prescribed. PRNs as appropriate. Q15 min checks for safety.   R: Pt safe on the unit. Will continue to monitor.

## 2021-09-17 DIAGNOSIS — F29 Unspecified psychosis not due to a substance or known physiological condition: Principal | ICD-10-CM

## 2021-09-17 MED ORDER — RISPERIDONE 3 MG PO TABS: 3.0000 mg | ORAL_TABLET | Freq: Every day | ORAL | 0 refills | Status: DC

## 2021-09-17 MED ORDER — NICOTINE POLACRILEX 2 MG MT GUM: 2.0000 mg | CHEWING_GUM | OROMUCOSAL | 0 refills | Status: DC | PRN

## 2021-09-17 MED ORDER — RISPERIDONE 3 MG PO TABS: 3.0000 mg | ORAL_TABLET | Freq: Two times a day (BID) | ORAL | 0 refills | Status: DC

## 2021-09-17 MED ORDER — TRAZODONE HCL 50 MG PO TABS
50.0000 mg | ORAL_TABLET | Freq: Once | ORAL | Status: AC
  Administered 2021-09-17: 50 mg via ORAL
  Filled 2021-09-17 (×2): qty 1

## 2021-09-17 MED ORDER — NICOTINE POLACRILEX 2 MG MT GUM: 2.0000 mg | CHEWING_GUM | OROMUCOSAL | Status: DC | PRN

## 2021-09-17 MED ORDER — HALOPERIDOL 5 MG PO TABS: 5.0000 mg | ORAL_TABLET | Freq: Every day | ORAL | 0 refills | Status: DC

## 2021-09-17 MED ORDER — HYDROCERIN EX CREA: 1.0000 | TOPICAL_CREAM | Freq: Two times a day (BID) | CUTANEOUS | 0 refills | Status: DC

## 2021-09-17 MED ORDER — BENZTROPINE MESYLATE 0.5 MG PO TABS: 0.5000 mg | ORAL_TABLET | Freq: Two times a day (BID) | ORAL | 0 refills | Status: DC

## 2021-09-17 MED ORDER — RISPERIDONE 3 MG PO TABS
3.0000 mg | ORAL_TABLET | Freq: Every day | ORAL | Status: DC
  Filled 2021-09-17: qty 7

## 2021-09-17 NOTE — Progress Notes (Signed)
  [facility] Adult Case Management Discharge Plan :  Will you be returning to the same living situation after discharge:  Yes,  home with fiance  At discharge, do you have transportation home?: Yes,  father will be picking patient up Do you have the ability to pay for your medications: Yes,  family support and provided resources for assistance to afford medications  Release of information consent forms completed and in the chart;  Patient's signature needed at discharge.  Patient to Follow up at:  Follow-up Information     [facility] Follow up.   Specialty: Professional Counselor Why: Please go to this provider during their walk in hours to get connected to med management and therapy services.  Walk in hours are between 9 am and 1 pm Monday through Friday. Contact information: [employer] [facility] [employer] [address] [place] [address] [phone]         [facility] Follow up.   Specialty: Urgent Care Why: You may also go to this provider for med management and therapy services.  Please go to this provider on Monday or Wednesdays by 7:30am to get connected to services.  It is a first come, first serve basis so please arrive at 7:30am. Contact information: [address] [place] [address] [phone]        [facility]. Go to.   Why: You have an appointment on July 25th at 2:30pm.  You have a new patient packet being mailed to you.  If you need to fill out packet when you get here, please come to appt at 2:15pm. Contact information: [address] [employer] [address] [place] [address] [phone]                Next level of care provider has access to [facility] Link:yes  Safety Planning and Suicide Prevention discussed: Yes,  sister     Has patient been referred to the Quitline?: Yes, faxed on 6/28  Patient has been referred for  addiction treatment: N/A, patient has no current use.   Ramses Klecka E Rhylee Pucillo, LCSW 09/17/2021, 9:32 AM

## 2021-09-17 NOTE — [facility] Suicide Risk Assessment (Addendum)
Suicide Risk Assessment  Discharge Assessment    [facility] Discharge Suicide Risk Assessment   Principal Problem: Schizophrenia spectrum disorder with psychotic disorder type not yet determined [facility]) Discharge Diagnoses: Principal Problem:   Schizophrenia spectrum disorder with psychotic disorder type not yet determined (HCC)  During the patient's hospitalization, patient had extensive initial psychiatric evaluation, and follow-up psychiatric evaluations every day.  Psychiatric diagnoses provided upon initial assessment: Unspecified schizophrenia spectrum and other psychotic d/o  Patient's psychiatric medications were adjusted on admission: He was started on Risperdal.  During the hospitalization, other adjustments were made to the patient's psychiatric medication regimen: His Risperdal was titrated.  He was also started on Haldol and Cogentin for better symptom control.  During his hospitalization MRI revealed abnormalities so Neurology was consulted and EEG performed to rule out Seizures.  He will follow up with Neurology outpatient.  Gradually, patient started adjusting to milieu.   Patient's care was discussed during the interdisciplinary team meeting every day during the hospitalization.  The patient is not having side effects to prescribed psychiatric medication.  The patient reports their target psychiatric symptoms of psychosis and delusions responded well to the psychiatric medications, and the patient reports overall benefit other psychiatric hospitalization. Supportive psychotherapy was provided to the patient. The patient also participated in regular group therapy while admitted.   Labs were reviewed with the patient, and abnormal results were discussed with the patient.  The patient denied having suicidal thoughts more than 48 hours prior to discharge.  Patient denies having homicidal thoughts.  Patient denies having auditory hallucinations.  Patient denies any visual  hallucinations.  Patient denies having paranoid thoughts.  The patient is able to verbalize their individual safety plan to this provider.  It is recommended to the patient to continue psychiatric medications as prescribed, after discharge from the hospital.    It is recommended to the patient to follow up with your outpatient psychiatric provider and PCP.  Discussed with the patient, the impact of alcohol, drugs, tobacco have been there overall psychiatric and medical wellbeing, and total abstinence from substance use was recommended the patient.  Total Time spent with patient: 20 minutes  Musculoskeletal: Strength & Muscle Tone: within normal limits Gait & Station: normal Patient leans: N/A  Psychiatric Specialty Exam  Presentation  General Appearance: Appropriate for Environment; Casual; Fairly Groomed  Eye Contact:Good  Speech:Clear and Coherent; Normal Rate  Speech Volume:Normal  Handedness:Right   Mood and Affect  Mood:Euthymic  Duration of Depression Symptoms: Greater than two weeks  Affect:Appropriate; Congruent   Thought Process  Thought Processes:Coherent; Goal Directed  Descriptions of Associations:Intact  Orientation:Full (Time, Place and Person)  Thought Content:Logical No SI, HI, or AVH. No Paranoia, Ideas of Reference, or First Rank symptoms.  History of Schizophrenia/Schizoaffective disorder:No  Duration of Psychotic Symptoms:Greater than six months  Hallucinations:Hallucinations: None  Ideas of Reference:None  Suicidal Thoughts:Suicidal Thoughts: No  Homicidal Thoughts:Homicidal Thoughts: No   Sensorium  Memory:Immediate Fair; Recent Fair  Judgment:Fair  Insight:Fair   Executive Functions  Concentration:Good  Attention Span:Good  Recall:Good  Fund of Knowledge:Fair  Language:Fair   Psychomotor Activity  Psychomotor Activity:Psychomotor Activity: Normal   Assets  Assets:Communication Skills; Desire for Improvement;  Resilience; Social Support; Physical Health; Housing   Sleep  Sleep:Sleep: Good Number of Hours of Sleep: 6   Physical Exam: Physical Exam Vitals reviewed.  Constitutional:      General: He is not in acute distress.    Appearance: He is normal weight. He is not ill-appearing or  toxic-appearing.  HENT:     Head: Normocephalic and atraumatic.  Pulmonary:     Effort: Pulmonary effort is normal.  Musculoskeletal:        General: Normal range of motion.  Neurological:     General: No focal deficit present.     Mental Status: He is alert.    Review of Systems  Respiratory:  Negative for cough and shortness of breath.   Cardiovascular:  Negative for chest pain.  Gastrointestinal:  Negative for abdominal pain, constipation, diarrhea, nausea and vomiting.  Neurological:  Negative for dizziness, weakness and headaches.  Psychiatric/Behavioral:  Negative for depression, hallucinations and suicidal ideas. The patient is not nervous/anxious.       09/17/21 1028  Facial and Oral Movements  Muscles of Facial Expression 0  Lips and Perioral Area 0  Jaw 0  Tongue 1  Extremity Movements  Upper (arms, wrists, hands, fingers) 0  Lower (legs, knees, ankles, toes) 0  Trunk Movements  Neck, shoulders, hips 0  Overall Severity  Severity of abnormal movements (highest score from questions above) 1  Incapacitation due to abnormal movements 0  Patient's awareness of abnormal movements (rate only patient's report) 0  Dental Status  Current problems with teeth and/or dentures? No  Does patient usually wear dentures? No  AIMS Total Score  AIMS Total Score 2  No Cogwheeling or Rigidity Present.  Blood pressure 118/69, pulse 88, temperature 97.8 F (36.6 C), temperature source Oral, resp. rate 18, height 5' 10.87" (1.8 m), weight 72.1 kg, SpO2 100 %. Body mass index is 22.26 kg/m.  Mental Status Per Nursing Assessment::   On Admission:  NA  Demographic Factors:  Male and Low  socioeconomic status  Loss Factors: Decline in physical health  Historical Factors: NA  Risk Reduction Factors:   Sense of responsibility to family, Employed, Living with another person, especially a relative, and Positive social support  Continued Clinical Symptoms:  Medical Diagnoses and Treatments/Surgeries  Cognitive Features That Contribute To Risk:  Loss of executive function    Suicide Risk:  Minimal: No identifiable suicidal ideation.  Patients presenting with no risk factors but with morbid ruminations; may be classified as minimal risk based on the severity of the depressive symptoms   Follow-up Information     [facility] Follow up.   Specialty: Professional Counselor Why: Please go to this provider during their walk in hours to get connected to med management and therapy services.  Walk in hours are between 9 am and 1 pm Monday through Friday. Contact information: [employer] [facility] [employer] [address] [place] [address] [phone]         [facility] Follow up.   Specialty: Urgent Care Why: You may also go to this provider for med management and therapy services.  Please go to this provider on Monday or Wednesdays by 7:30am to get connected to services.  It is a first come, first serve basis so please arrive at 7:30am. Contact information: [address] [place] [address] [phone]        [facility]. Go to.   Why: You have an appointment on July 25th at 2:30pm.  You have a new patient packet being mailed to you.  If you need to fill out packet when you get here, please come to appt at 2:15pm. Contact information: [address] [place] [address] [phone]  Plan Of Care/Follow-up recommendations:   Activity: as tolerated  Diet: heart healthy  Other: -Follow-up with your  outpatient psychiatric provider -instructions on appointment date, time, and address (location) are provided to you in discharge paperwork.  -Take your psychiatric medications as prescribed at discharge - instructions are provided to you in the discharge paperwork  -Follow-up with outpatient primary care doctor and other specialists -for management of chronic medical disease, including: Eczema.  You will need routine monitoring of your- weight, A1c, Lipid Panel, CMP, CBC, and EKG because of your antipsychotic.  Follow up with Neurology outpatient for continued work up.   -Testing: Follow-up with outpatient provider for abnormal lab results: None  -Recommend abstinence from alcohol, tobacco, and other illicit drug use at discharge.   -If your psychiatric symptoms recur, worsen, or if you have side effects to your psychiatric medications, call your outpatient psychiatric provider, [phone] or go to the nearest emergency department.  -If suicidal thoughts recur, call your outpatient psychiatric provider, 911, [phone] or go to the nearest emergency department.   Lauro Franklin, MD 09/17/2021, 9:31 AM

## 2021-09-17 NOTE — Plan of Care (Signed)
Patient was able to identify positive coping skills at completion of recreation therapy group sessions. ° ° °Larry Simmons, LRT,CTRS °

## 2021-09-17 NOTE — Progress Notes (Signed)
Recreation Therapy Notes  INPATIENT RECREATION TR PLAN  Patient Details Name: Larry Simmons MRN: [id number] DOB: April 20, 1990 Today's Date: 09/17/2021  Rec Therapy Plan Is patient appropriate for Therapeutic Recreation?: Yes Treatment times per week: about 3 days Estimated Length of Stay: 5-7 days TR Treatment/Interventions: Group participation (Comment)  Discharge Criteria Pt will be discharged from therapy if:: Discharged Treatment plan/goals/alternatives discussed and agreed upon by:: Patient/family  Discharge Summary Short term goals set: See patient care plan Short term goals met: Complete Progress toward goals comments: Groups attended Which groups?: AAA/T, Coping skills, Leisure education, Stress management, Other (Comment) (Team Building; Self-Expression; Decision Making) Reason goals not met: None Therapeutic equipment acquired: N/A Reason patient discharged from therapy: Discharge from hospital Pt/family agrees with progress & goals achieved: Yes Date patient discharged from therapy: 09/17/21    Victorino Sparrow, Vickki Muff, Earlimart A 09/17/2021, 11:23 AM

## 2021-09-17 NOTE — Discharge Summary (Signed)
Physician Discharge Summary Note  Patient:  Larry Simmons is an 31 y.o., male MRN:  [id number] DOB:  01/28/1991 Patient phone:  [phone] (home)  Patient address:   [address] [place] [address],  Total Time spent with patient: 20 minutes  Date of Admission:  09/09/2021 Date of Discharge: 09/17/2021  Reason for Admission:   Patient is a 31 year old male with unclear psychiatric history, who was admitted to the psychiatric hospital for evaluations of paranoia and delusions about someone hurting him.  Patient was medically cleared at the [facility] prior to admission to the psychiatric unit.  Prior to admission psychiatric medications: None reported   On my assessment, the patient is extremely paranoid and suspicious of this Clinical research associate.  He does not believe this Clinical research associate is his doctor despite showing the name tag.  He is fixated on finding Melissa, the Child psychotherapist, who the patient believes is his physician, and will not answer any questions regarding his psychiatric symptoms, past psychiatric history, past medical history, social history, family history, substance use history, or medical review of systems, as he reports he will only talk about these topics with Melissa.  The patient leaves the room in which she is being interviewed, walks to the nursing station to ask for Melissa, and does not return for the interview to continue.  He does not want to continue the interview for this history and physical.  Principal Problem: Schizophrenia spectrum disorder with psychotic disorder type not yet determined [facility]) Discharge Diagnoses: Principal Problem:   Schizophrenia spectrum disorder with psychotic disorder type not yet determined [facility])   Past Psychiatric History: Unclear but he denies previous diagnosis', Suicide Attempts, or hospitalizations.  Past Medical History:  Past Medical History:  Diagnosis Date   Asthma    Eczema    History reviewed. No pertinent surgical  history. Family History: History reviewed. No pertinent family history. Family Psychiatric  History: Mother- Schizophrenia Social History:  Social History   Substance and Sexual Activity  Alcohol Use Yes   Comment: occassionl     Social History   Substance and Sexual Activity  Drug Use Yes   Types: Marijuana    Social History   Socioeconomic History   Marital status: Single    Spouse name: Not on file   Number of children: 0   Years of education: Not on file   Highest education level: High school graduate  Occupational History   Occupation: Social research officer, government     Comment: Museum/gallery curator  Tobacco Use   Smoking status: Every Day    Packs/day: 0.25    Types: Cigarettes   Smokeless tobacco: Never   Tobacco comments:    Interested in nicorette gum, will be ordered.  Substance and Sexual Activity   Alcohol use: Yes    Comment: occassionl   Drug use: Yes    Types: Marijuana   Sexual activity: Yes  Other Topics Concern   Not on file  Social History Narrative   Not on file   Social Determinants of Health   Financial Resource Strain: Not on file  Food Insecurity: Not on file  Transportation Needs: Not on file  Physical Activity: Not on file  Stress: Not on file  Social Connections: Not on file    Hospital Course:   During the patient's hospitalization, patient had extensive initial psychiatric evaluation, and follow-up psychiatric evaluations every day.  Psychiatric diagnoses provided upon initial assessment: Unspecified schizophrenia spectrum and other psychotic d/o  Patient's psychiatric medications were  adjusted on admission: He was started on Risperdal.  During the hospitalization, other adjustments were made to the patient's psychiatric medication regimen: His Risperdal was titrated.  He was also started on Haldol and Cogentin for better symptom control.  During his hospitalization MRI revealed abnormalities so Neurology was consulted and EEG performed to rule  out Seizures.  He will follow up with Neurology outpatient.  Patient's care was discussed during the interdisciplinary team meeting every day during the hospitalization.  The patient is not having side effects to prescribed psychiatric medication.  Gradually, patient started adjusting to milieu. The patient was evaluated each day by a clinical provider to ascertain response to treatment. Improvement was noted by the patient's report of decreasing symptoms, improved sleep and appetite, affect, medication tolerance, behavior, and participation in unit programming.  Patient was asked each day to complete a self inventory noting mood, mental status, pain, new symptoms, anxiety and concerns.   Symptoms were reported as significantly decreased or resolved completely by discharge.  The patient reports that their mood is stable.  The patient denied having suicidal thoughts for more than 48 hours prior to discharge.  Patient denies having homicidal thoughts.  Patient denies having auditory hallucinations.  Patient denies any visual hallucinations or other symptoms of psychosis.  The patient was motivated to continue taking medication with a goal of continued improvement in mental health.   The patient reports their target psychiatric symptoms of psychosis and delusions responded well to the psychiatric medications, and the patient reports overall benefit other psychiatric hospitalization. Supportive psychotherapy was provided to the patient. The patient also participated in regular group therapy while hospitalized. Coping skills, problem solving as well as relaxation therapies were also part of the unit programming.  Labs were reviewed with the patient, and abnormal results were discussed with the patient.  The patient is able to verbalize their individual safety plan to this provider.  # It is recommended to the patient to continue psychiatric medications as prescribed, after discharge from the hospital.     # It is recommended to the patient to follow up with your outpatient psychiatric provider and PCP.  # It was discussed with the patient, the impact of alcohol, drugs, tobacco have been there overall psychiatric and medical wellbeing, and total abstinence from substance use was recommended the patient.ed.  # Prescriptions provided or sent directly to preferred pharmacy at discharge. Patient agreeable to plan. Given opportunity to ask questions. Appears to feel comfortable with discharge.    # In the event of worsening symptoms, the patient is instructed to call the crisis hotline, 911 and or go to the nearest ED for appropriate evaluation and treatment of symptoms. To follow-up with primary care provider for other medical issues, concerns and or health care needs  # Patient was discharged home with a plan to follow up as noted below.    On day of discharge patient reports he slept good last night.  He reports his appetite is doing good.  He reports no SI, HI, or AVH.  He reports no Parnoia, Ideas of Reference, or other First Rank symptoms.  He reports no issues with his medications.  Discussed with him that his EEG showed no signs of Seizure so we would be able to discharge and follow up.  Discussed that since he is on an antipsychotic he will need routine monitoring of- weight, A1c, Lipid Panel, CMP, CBC, and EKG.  Discussed if he notices any abnormal muscle movements/twitching he and they needs  to contact his outpatient provider immediately.  Discussed with him what to do in the event of a future crisis.  Discussed that he can return to [facility], go to the [facility], go to the nearest ED, or call 911 or [phone].   He reported understanding and had no concerns.    Physical Findings: AIMS: Facial and Oral Movements Muscles of Facial Expression: None, normal Lips and Perioral Area: None, normal Jaw: None, normal Tongue: Minimal,Extremity Movements Upper (arms, wrists, hands, fingers): None, normal Lower  (legs, knees, ankles, toes): None, normal, Trunk Movements Neck, shoulders, hips: None, normal, Overall Severity Severity of abnormal movements (highest score from questions above): Minimal Incapacitation due to abnormal movements: None, normal Patient's awareness of abnormal movements (rate only patient's report): No Awareness, Dental Status Current problems with teeth and/or dentures?: No Does patient usually wear dentures?: No  No Cogwheeling or Rigidty.  Musculoskeletal: Strength & Muscle Tone: within normal limits Gait & Station: normal Patient leans: N/A   Psychiatric Specialty Exam:  Presentation  General Appearance: Appropriate for Environment; Casual; Fairly Groomed  Eye Contact:Good  Speech:Clear and Coherent; Normal Rate  Speech Volume:Normal  Handedness:Right   Mood and Affect  Mood:Euthymic  Affect:Appropriate; Congruent   Thought Process  Thought Processes:Coherent; Goal Directed  Descriptions of Associations:Intact  Orientation:Full (Time, Place and Person)  Thought Content:Logical No SI, HI, or AVH. No Paranoia, Ideas of Reference, or First Rank symptoms.  History of Schizophrenia/Schizoaffective disorder:No  Duration of Psychotic Symptoms:Greater than six months  Hallucinations:Hallucinations: None  Ideas of Reference:None  Suicidal Thoughts:Suicidal Thoughts: No  Homicidal Thoughts:Homicidal Thoughts: No   Sensorium  Memory:Immediate Fair; Recent Fair  Judgment:Fair  Insight:Fair   Executive Functions  Concentration:Good  Attention Span:Good  Recall:Good  Fund of Knowledge:Fair  Language:Fair   Psychomotor Activity  Psychomotor Activity:Psychomotor Activity: Normal   Assets  Assets:Communication Skills; Desire for Improvement; Resilience; Social Support; Physical Health; Housing   Sleep  Sleep:Sleep: Good Number of Hours of Sleep: 6    Physical Exam: Physical Exam Nursing note reviewed.  Constitutional:       General: He is not in acute distress.    Appearance: Normal appearance. He is normal weight. He is not ill-appearing or toxic-appearing.  HENT:     Head: Normocephalic and atraumatic.  Pulmonary:     Effort: Pulmonary effort is normal.  Musculoskeletal:        General: Normal range of motion.  Neurological:     General: No focal deficit present.     Mental Status: He is alert.    Review of Systems  Respiratory:  Negative for cough and shortness of breath.   Cardiovascular:  Negative for chest pain.  Gastrointestinal:  Negative for abdominal pain, constipation, diarrhea, nausea and vomiting.  Neurological:  Negative for dizziness, weakness and headaches.  Psychiatric/Behavioral:  Negative for depression, hallucinations and suicidal ideas. The patient is not nervous/anxious.    Blood pressure 118/69, pulse 88, temperature 97.8 F (36.6 C), temperature source Oral, resp. rate 18, height 5' 10.87" (1.8 m), weight 72.1 kg, SpO2 100 %. Body mass index is 22.26 kg/m.   Social History   Tobacco Use  Smoking Status Every Day   Packs/day: 0.25   Types: Cigarettes  Smokeless Tobacco Never  Tobacco Comments   Interested in nicorette gum, will be ordered.   Tobacco Cessation:  A prescription for an FDA-approved tobacco cessation medication provided at discharge   Blood Alcohol level:  Lab Results  Component Value Date   [facility] <10 09/06/2021  ETH <10 02/10/2020    Metabolic Disorder Labs:  Lab Results  Component Value Date   HGBA1C 5.2 09/06/2021   MPG 102.54 09/06/2021   MPG 108.28 02/10/2020   Lab Results  Component Value Date   PROLACTIN 3.2 (L) 09/08/2021   PROLACTIN 4.3 02/10/2020   Lab Results  Component Value Date   CHOL 211 (H) 09/08/2021   TRIG 49 09/08/2021   HDL 102 09/08/2021   CHOLHDL 2.1 09/08/2021   VLDL 10 09/08/2021   LDLCALC 99 09/08/2021   LDLCALC 102 (H) 09/06/2021    See Psychiatric Specialty Exam and Suicide Risk Assessment completed by  Attending Physician prior to discharge.  Discharge destination:  Home  Is patient on multiple antipsychotic therapies at discharge:  Yes,   Do you recommend tapering to monotherapy for antipsychotics?  Yes   Has Patient had three or more failed trials of antipsychotic monotherapy by history:  No  Recommended Plan for Multiple Antipsychotic Therapies: Patient's medications are in the process of a cross-taper;  medications include:  Increasing Haldol and tapering off the Risperdal as able.  Discharge Instructions     Diet - low sodium heart healthy   Complete by: As directed    Increase activity slowly   Complete by: As directed       Allergies as of 09/17/2021       Reactions   Iodinated Contrast Media Other (See Comments)   Irritated skin   Shellfish Allergy Swelling   Peanut-containing Drug Products Swelling        Medication List     STOP taking these medications    multivitamin with minerals Tabs tablet       TAKE these medications      Indication  benztropine 0.5 MG tablet Commonly known as: COGENTIN Take 1 tablet (0.5 mg total) by mouth 2 (two) times daily.  Indication: Extrapyramidal Reaction caused by Medications   haloperidol 5 MG tablet Commonly known as: HALDOL Take 1 tablet (5 mg total) by mouth daily. Start taking on: September 18, 2021  Indication: Psychosis   hydrocerin Crea Apply 1 Application topically 2 (two) times daily.  Indication: Ezcema   hydrocortisone cream 1 % Apply 1 Application topically 2 (two) times daily as needed for itching.  Indication: Eczema   nicotine polacrilex 2 MG gum Commonly known as: NICORETTE Take 1 each (2 mg total) by mouth as needed for smoking cessation.  Indication: Nicotine Addiction   risperiDONE 3 MG tablet Commonly known as: RisperDAL Take 1 tablet (3 mg total) by mouth at bedtime.  Indication: Schizophrenia        Follow-up Information     [facility] Follow up.    Specialty: Professional Counselor Why: Please go to this provider during their walk in hours to get connected to med management and therapy services.  Walk in hours are between 9 am and 1 pm Monday through Friday. Contact information: [employer] [facility] [employer] [address] [place] [address] [phone]         [facility] Follow up.   Specialty: Urgent Care Why: You may also go to this provider for med management and therapy services.  Please go to this provider on Monday or Wednesdays by 7:30am to get connected to services.  It is a first come, first serve basis so please arrive at 7:30am. Contact information: [address] [place] [address] [phone]  [facility]. Go to.   Why: You have an appointment on July 25th at 2:30pm.  You have a new patient packet being mailed to you.  If you need to fill out packet when you get here, please come to appt at 2:15pm. Contact information: [address] [place] [address] [phone]                Follow-up recommendations/Comments:    Activity: as tolerated  Diet: heart healthy  Other: -Follow-up with your outpatient psychiatric provider -instructions on appointment date, time, and address (location) are provided to you in discharge paperwork.  -Take your psychiatric medications as prescribed at discharge - instructions are provided to you in the discharge paperwork  -Follow-up with outpatient primary care doctor and other specialists -for management of chronic medical disease, including: Eczema.  You will need routine monitoring of your- weight, A1c, Lipid Panel, CMP, CBC, and EKG because of your antipsychotic.   -Testing: Follow-up with outpatient provider for abnormal lab results: None  -Recommend abstinence from alcohol, tobacco, and other illicit drug use at discharge.   -If your  psychiatric symptoms recur, worsen, or if you have side effects to your psychiatric medications, call your outpatient psychiatric provider, 911, [phone] or go to the nearest emergency department.  -If suicidal thoughts recur, call your outpatient psychiatric provider, 911, [phone] or go to the nearest emergency department.   Signed: Lauro Franklin, MD 09/17/2021, 12:00 PM

## 2021-09-17 NOTE — Progress Notes (Signed)
Discharge Note:  Patient discharged home with family member.  Patient denied SI and HI. Denied A/V hallucinations. Suicide prevention information given and discussed with patient who stated they understood and had no questions. Patient stated they received all their belongings, clothing, toiletries, misc items, etc. Patient stated they appreciated all assistance received from [facility] staff. All required discharge information given to patient. 

## 2021-09-20 NOTE — Group Note (Signed)
LCSW Group Therapy Note   Group Date: 09/14/2021 Start Time: 1115 End Time: 1200   Participated fully  Lynnell Chad, LCSWA 09/20/2021  11:16 AM

## 2021-10-09 ENCOUNTER — Encounter (HOSPITAL_COMMUNITY): Payer: Self-pay | Admitting: Student in an Organized Health Care Education/Training Program

## 2021-10-09 ENCOUNTER — Ambulatory Visit (INDEPENDENT_AMBULATORY_CARE_PROVIDER_SITE_OTHER): Payer: No Payment, Other | Admitting: Student in an Organized Health Care Education/Training Program

## 2021-10-09 VITALS — BP 137/87 | HR 62 | Ht 73.0 in | Wt 167.2 lb

## 2021-10-09 DIAGNOSIS — F29 Unspecified psychosis not due to a substance or known physiological condition: Secondary | ICD-10-CM

## 2021-10-09 MED ORDER — HALOPERIDOL 5 MG PO TABS: 5.0000 mg | ORAL_TABLET | Freq: Every day | ORAL | 1 refills | Status: DC

## 2021-10-09 MED ORDER — RISPERIDONE 2 MG PO TABS: 2.0000 mg | ORAL_TABLET | Freq: Every day | ORAL | 1 refills | Status: DC

## 2021-10-09 MED ORDER — BENZTROPINE MESYLATE 0.5 MG PO TABS: 0.5000 mg | ORAL_TABLET | Freq: Two times a day (BID) | ORAL | 1 refills | Status: DC
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
## 2021-10-14 ENCOUNTER — Ambulatory Visit: Payer: Medicaid Other | Attending: Family Medicine | Admitting: Family Medicine

## 2021-10-14 ENCOUNTER — Encounter: Payer: Self-pay | Admitting: Family Medicine

## 2021-10-14 VITALS — BP 111/70 | HR 71 | Temp 97.9°F | Ht 73.0 in | Wt 174.0 lb

## 2021-10-14 DIAGNOSIS — J45909 Unspecified asthma, uncomplicated: Secondary | ICD-10-CM | POA: Insufficient documentation

## 2021-10-14 DIAGNOSIS — F29 Unspecified psychosis not due to a substance or known physiological condition: Secondary | ICD-10-CM

## 2021-10-14 DIAGNOSIS — L309 Dermatitis, unspecified: Secondary | ICD-10-CM | POA: Insufficient documentation

## 2021-10-14 DIAGNOSIS — L308 Other specified dermatitis: Secondary | ICD-10-CM

## 2021-10-14 DIAGNOSIS — F209 Schizophrenia, unspecified: Secondary | ICD-10-CM | POA: Insufficient documentation

## 2021-10-14 DIAGNOSIS — F17210 Nicotine dependence, cigarettes, uncomplicated: Secondary | ICD-10-CM | POA: Insufficient documentation

## 2021-10-14 DIAGNOSIS — J4520 Mild intermittent asthma, uncomplicated: Secondary | ICD-10-CM | POA: Insufficient documentation

## 2021-10-14 DIAGNOSIS — Z7952 Long term (current) use of systemic steroids: Secondary | ICD-10-CM | POA: Insufficient documentation

## 2021-10-14 DIAGNOSIS — R930 Abnormal findings on diagnostic imaging of skull and head, not elsewhere classified: Secondary | ICD-10-CM | POA: Insufficient documentation

## 2021-10-14 MED ORDER — PREDNISONE 20 MG PO TABS: 20.0000 mg | ORAL_TABLET | Freq: Every day | ORAL | 0 refills | Status: DC

## 2021-10-14 MED ORDER — TRIAMCINOLONE ACETONIDE 0.1 % EX CREA: 1.0000 | TOPICAL_CREAM | Freq: Two times a day (BID) | CUTANEOUS | 0 refills | Status: DC

## 2021-10-14 NOTE — Progress Notes (Signed)
Subjective:  Patient ID: Larry Simmons, male    DOB: [birth year]  Age: 31 y.o. MRN: [id number]  CC: Hospitalization Follow-up   HPI Larry Simmons is a 31 y.o. year old male with a history of eczema, asthma who presents to establish care. Accompanied by his sister.  He was hospitalized 09/09/2021 through 09/09/2021 for schizophrenia spectrum disorder with psychoses and commenced on psychotropics. During his hospital course MRI brain had revealed: IMPRESSION: Significant asymmetric dilatation right temporal horn without underlying mass lesion. This appears to be due to volume loss in the hippocampus causing enlargement of the temporal horn. Question history of seizure.   Chronic microhemorrhage right external capsule.  Otherwise negative    EEG to evaluate for seizure was negative.  Recommendation was to follow-up with neurology outpatient.  Interval History:  He has an upcoming appointment with Behavioral Health and endorses adherence with his psychotropic medications. He does not have a follow-up with Neurology.  Requests a referral to Dermatology due to his eczema which occurs on his entire body. He uses OTC Eucerin, Petroleum jelly, Hydrocortisone. In the past he received topical creams which have been ineffective  His Asthma is controlled without medication He smokes 2 Cigarettes /day Past Medical History:  Diagnosis Date   Asthma    Eczema     History reviewed. No pertinent surgical history.  History reviewed. No pertinent family history.  Social History   Socioeconomic History   Marital status: Single    Spouse name: Not on file   Number of children: 0   Years of education: Not on file   Highest education level: High school graduate  Occupational History   Occupation: Social research officer, government     Comment: Museum/gallery curator  Tobacco Use   Smoking status: Every Day    Packs/day: 0.25    Types: Cigarettes   Smokeless tobacco: Never   Tobacco comments:     Interested in nicorette gum, will be ordered.  Substance and Sexual Activity   Alcohol use: Yes    Comment: occassionl   Drug use: Yes    Types: Marijuana   Sexual activity: Yes  Other Topics Concern   Not on file  Social History Narrative   Not on file   Social Determinants of Health   Financial Resource Strain: Not on file  Food Insecurity: Not on file  Transportation Needs: Not on file  Physical Activity: Not on file  Stress: Not on file  Social Connections: Not on file    Allergies  Allergen Reactions   Iodinated Contrast Media Other (See Comments)    Irritated skin   Shellfish Allergy Swelling   Peanut-Containing Drug Products Swelling    Outpatient Medications Prior to Visit  Medication Sig Dispense Refill   benztropine (COGENTIN) 0.5 MG tablet Take 1 tablet (0.5 mg total) by mouth 2 (two) times daily. 60 tablet 1   haloperidol (HALDOL) 5 MG tablet Take 1 tablet (5 mg total) by mouth daily. 30 tablet 1   hydrocerin (EUCERIN) CREA Apply 1 Application topically 2 (two) times daily.  0   hydrocortisone cream 1 % Apply 1 Application topically 2 (two) times daily as needed for itching.     nicotine polacrilex (NICORETTE) 2 MG gum Take 1 each (2 mg total) by mouth as needed for smoking cessation. 20 tablet 0   risperiDONE (RISPERDAL) 2 MG tablet Take 1 tablet (2 mg total) by mouth at bedtime. 30 tablet 1   No facility-administered medications prior to visit.  ROS Review of Systems  Constitutional:  Negative for activity change and appetite change.  HENT:  Negative for sinus pressure and sore throat.   Respiratory:  Negative for chest tightness, shortness of breath and wheezing.   Cardiovascular:  Negative for chest pain and palpitations.  Gastrointestinal:  Negative for abdominal distention, abdominal pain and constipation.  Genitourinary: Negative.   Musculoskeletal: Negative.   Skin:  Positive for rash.  Psychiatric/Behavioral:  Negative for behavioral  problems and dysphoric mood.     Objective:  BP 111/70   Pulse 71   Temp 97.9 F (36.6 C) (Oral)   Ht 6\' 1"  (1.854 m)   Wt 174 lb (78.9 kg)   SpO2 99%   BMI 22.96 kg/m      10/14/2021    2:46 PM 10/09/2021   11:07 AM 09/17/2021    6:11 AM  BP/Weight  Systolic BP 111 137 118  Diastolic BP 70 87 69  Wt. (Lbs) 174 167.2   BMI 22.96 kg/m2 22.06 kg/m2       Physical Exam Constitutional:      Appearance: He is well-developed.  Cardiovascular:     Rate and Rhythm: Normal rate.     Heart sounds: Normal heart sounds. No murmur heard. Pulmonary:     Effort: Pulmonary effort is normal.     Breath sounds: Normal breath sounds. No wheezing or rales.  Chest:     Chest wall: No tenderness.  Abdominal:     General: Bowel sounds are normal. There is no distension.     Palpations: Abdomen is soft. There is no mass.     Tenderness: There is no abdominal tenderness.  Musculoskeletal:        General: Normal range of motion.     Right lower leg: No edema.     Left lower leg: No edema.  Skin:    Comments: Lichenification of entire skin with hyper pigmentation sparing the face with associated postinflammatory hypopigmentation in hands and ankles  Neurological:     Mental Status: He is alert and oriented to person, place, and time.  Psychiatric:        Mood and Affect: Mood normal.        Latest Ref Rng & Units 09/16/2021    7:22 AM 09/08/2021   10:41 AM 09/06/2021    1:46 PM  CMP  Glucose 70 - 99 mg/dL 87  09/08/2021  70   BUN 6 - 20 mg/dL 11  13  10    Creatinine 0.61 - 1.24 mg/dL 976   7.34   Sodium 135 - 145 mmol/L 142  140  140   Potassium 3.5 - 5.1 mmol/L 3.9  3.9  4.5   Chloride 98 - 111 mmol/L 106  102  101   CO2 22 - 32 mmol/L 29  27  25    Calcium 8.9 - 10.3 mg/dL 9.3  9.7  1.93   Total Protein 6.5 - 8.1 g/dL 6.5  7.9  7.9   Total Bilirubin 0.3 - 1.2 mg/dL 0.7  0.8  1.0   Alkaline Phos 38 - 126 U/L 47  52  54   AST 15 - 41 U/L 35  30  26   ALT 0 - 44 U/L 17  18  17       Lipid Panel     Component Value Date/Time   CHOL 211 (H) 09/08/2021 1041   TRIG 49 09/08/2021 1041   HDL 102 09/08/2021 1041   CHOLHDL 2.1 09/08/2021  1041   VLDL 10 09/08/2021 1041   LDLCALC 99 09/08/2021 1041    CBC    Component Value Date/Time   WBC 4.0 09/16/2021 0722   RBC 4.42 09/16/2021 0722   HGB 13.4 09/16/2021 0722   HCT 41.0 09/16/2021 0722   PLT 219 09/16/2021 0722   MCV 92.8 09/16/2021 0722   MCH 30.3 09/16/2021 0722   MCHC 32.7 09/16/2021 0722   RDW 12.3 09/16/2021 0722   LYMPHSABS 0.9 09/16/2021 0722   MONOABS 0.6 09/16/2021 0722   EOSABS 0.5 09/16/2021 0722   BASOSABS 0.0 09/16/2021 6811    Lab Results  Component Value Date   HGBA1C 5.2 09/06/2021    Assessment & Plan:  1. Other eczema Uncontrolled He will benefit from one of the Biologics Unfortunately the lack of medical coverage might delay his dermatology evaluation.  Advised to apply for the [place] financial discount He is here with his sister who states they will be willing to pay out-of-pocket for dermatology visit - triamcinolone cream (KENALOG) 0.1 %; Apply 1 Application topically 2 (two) times daily.  Dispense: 30 g; Refill: 0 - predniSONE (DELTASONE) 20 MG tablet; Take 1 tablet (20 mg total) by mouth daily with breakfast.  Dispense: 5 tablet; Refill: 0 - Ambulatory referral to Dermatology  2. Abnormal MRI of head - Ambulatory referral to Neurology  3. Mild intermittent asthma without complication Controlled with no exacerbations He currently does not use inhalers  4. Schizophrenia spectrum disorder with psychotic disorder type not yet determined (HCC) Currently on psychotropic medication Has upcoming appointment with behavioral health    Meds ordered this encounter  Medications   triamcinolone cream (KENALOG) 0.1 %    Sig: Apply 1 Application topically 2 (two) times daily.    Dispense:  30 g    Refill:  0   predniSONE (DELTASONE) 20 MG tablet    Sig: Take 1 tablet  (20 mg total) by mouth daily with breakfast.    Dispense:  5 tablet    Refill:  0    Follow-up: No follow-ups on file.       Hoy Register, MD, FAAFP. [facility] [place], [place] [phone]   10/14/2021, 5:23 PM

## 2021-10-14 NOTE — Progress Notes (Signed)
Medication refill for cream.

## 2021-10-14 NOTE — Patient Instructions (Signed)
Eczema Eczema refers to a group of skin conditions that cause skin to become rough and inflamed. Each type of eczema has different triggers, symptoms, and treatments. Eczema of any type is usually itchy. Symptoms range from mild to severe. Eczema is not spread from person to person (is not contagious). It can appear on different parts of the body at different times. One person's eczema may look different from another person's eczema. What are the causes? The exact cause of this condition is not known. However, exposure to certain environmental factors, irritants, and allergens can make the condition worse. What are the signs or symptoms? Symptoms of this condition depend on the type of eczema you have. The types include: Contact dermatitis. There are two kinds: Irritant contact dermatitis. This happens when something irritates the skin and causes a rash. Allergic contact dermatitis. This happens when your skin comes in contact with something you are allergic to (allergens). This can include poison ivy, chemicals, or medicines that were applied to your skin. Atopic dermatitis. This is a long-term (chronic) skin disease that keeps coming back (recurring). It is the most common type of eczema. Usual symptoms are a red rash and itchy, dry, scaly skin. It usually starts showing signs in infancy and can last through adulthood. Dyshidrotic eczema. This is a form of eczema on the hands and feet. It shows up as very itchy, fluid-filled blisters. It can affect people of any age but is more common before age 40. Hand eczema. This causes very itchy areas of skin on the palms and sides of the hands and fingers. This type of eczema is common in industrial jobs where you may be exposed to different types of irritants. Lichen simplex chronicus. This type of eczema occurs when a person constantly scratches one area of the body. Repeated scratching of the area leads to thickened skin (lichenification). This condition can  accompany other types of eczema. It is more common in adults but may also be seen in children. Nummular eczema. This is a common type of eczema that most often affects the lower legs and the backs of the hands. It typically causes an itchy, red, circular, crusty lesion (plaque). Scratching may become a habit and can cause bleeding. Nummular eczema occurs most often in middle-aged or older people. Seborrheic dermatitis. This is a common skin disease that mainly affects the scalp. It may also affect other oily areas of the body, such as the face, sides of the nose, eyebrows, ears, eyelids, and chest. It is marked by small scaling and redness of the skin (erythema). This can affect people of all ages. In infants, this condition is called cradle cap. Stasis dermatitis. This is a common skin disease that can cause itching, scaling, and hyperpigmentation, usually on the legs and feet. It occurs most often in people who have a condition that prevents blood from being pumped through the veins in the legs (chronic venous insufficiency). Stasis dermatitis is a chronic condition that needs long-term management. How is this diagnosed? This condition may be diagnosed based on: A physical exam of your skin. Your medical history. Skin patch tests. These tests involve using patches that contain possible allergens and placing them on your back. Your health care provider will check in a few days to see if an allergic reaction occurred. How is this treated? Treatment for eczema is based on the type of eczema you have. You may be given hydrocortisone steroid medicine or antihistamines. These can relieve itching quickly and help reduce inflammation.   These may be prescribed or purchased over the counter, depending on the strength that is needed. Follow these instructions at home: Take or apply over-the-counter and prescription medicines only as told by your health care provider. Use creams or ointments to moisturize your  skin. Do not use lotions. Learn what triggers or irritates your symptoms so you can avoid these things. Treat symptom flare-ups quickly. Do not scratch your skin. This can make your rash worse. Keep all follow-up visits. This is important. Where to find more information American Academy of Dermatology: aad.org National Eczema Association: nationaleczema.org The Society for Pediatric Dermatology: pedsderm.net Contact a health care provider if: You have severe itching, even with treatment. You scratch your skin regularly until it bleeds. Your rash looks different than usual. Your skin is painful, swollen, or more red than usual. You have a fever. Summary Eczema refers to a group of skin conditions that cause skin to become rough and inflamed. Each type has different triggers. Eczema of any type causes itching that may range from mild to severe. Treatment varies based on the type of eczema you have. Hydrocortisone steroid medicine or antihistamines can help with itching and inflammation. Protecting your skin is the best way to prevent eczema. Use creams or ointments to moisturize your skin. Avoid triggers and irritants. Treat flare-ups quickly. This information is not intended to replace advice given to you by your health care provider. Make sure you discuss any questions you have with your health care provider. Document Revised: 12/18/2019 Document Reviewed: 12/18/2019 Elsevier Patient Education  2023 Elsevier Inc.  

## 2021-10-21 ENCOUNTER — Encounter: Payer: Self-pay | Admitting: Neurology

## 2021-11-11 ENCOUNTER — Encounter (HOSPITAL_COMMUNITY): Payer: Self-pay | Admitting: Student in an Organized Health Care Education/Training Program

## 2021-11-11 ENCOUNTER — Ambulatory Visit (INDEPENDENT_AMBULATORY_CARE_PROVIDER_SITE_OTHER): Payer: No Payment, Other | Admitting: Student in an Organized Health Care Education/Training Program

## 2021-11-11 DIAGNOSIS — F29 Unspecified psychosis not due to a substance or known physiological condition: Secondary | ICD-10-CM | POA: Diagnosis not present

## 2021-11-11 MED ORDER — BENZTROPINE MESYLATE 0.5 MG PO TABS: 0.5000 mg | ORAL_TABLET | Freq: Two times a day (BID) | ORAL | 1 refills | Status: DC

## 2021-11-11 MED ORDER — RISPERIDONE 2 MG PO TABS: 2.0000 mg | ORAL_TABLET | Freq: Every day | ORAL | 1 refills | Status: DC

## 2021-11-11 MED ORDER — HALOPERIDOL 5 MG PO TABS: 5.0000 mg | ORAL_TABLET | Freq: Every day | ORAL | 1 refills | Status: DC

## 2021-11-11 NOTE — Progress Notes (Signed)
BH MD/PA/NP OP Progress Note  11/11/2021 11:10 AM Larry Simmons  MRN:  [id number]  Chief Complaint:  Chief Complaint  Patient presents with   Follow-up   Schizophrenia   HPI:  Larry Simmons is a 31 yr old male who presents for follow up and for medication management.  PPHx is significant for Schizophrenia Spectrum Disorder, 1 previous hospitalization [facility] 08/2021), and no Suicide attempts or Self Injurious Behavior.  He reports that he has been doing okay since our last appointment.  He reports he was able to get his Risperdal and has been taking it.  He reports no issues with restarting it.  He reports no issues with his medications in general but does report being a little tired.    He reports that he is currently going through a pretty significant stressor as he and his fiance have decided to break up.  He reports she is currently working on getting a new job as a Financial controller.  He reports that he will be moving out soon to live with his father.  He reports that they have managed to remain friendly with each other and he thinks they will be able to separate on good terms.  He then asked about long-acting injectable form of his medication.  Reminded him that we had discussed this on the inpatient unit and that this is a good option for many patients who have trouble keeping up with medications daily.  Discussed that we would most likely go with a medication that is closely related to Risperdal called Gean Birchwood for his LAI.  Discussed that there is patient assistance with this and if he would like to fill out the form to see if he would qualify for it and he reports that he would.  Obtained this form and he filled out.  Discussed with him that we would submit this and should know an answer within a few weeks.  He reports that his sleep has been good.  He reports his appetite has been doing good.  He reports no SI, HI, or AVH.  When asked about paranoia he does report he thinks  some of his neighbors may be monitoring him but he does not think they mean him any harm and he is not distressed by this.  He will return to the office in 4 weeks.    Visit Diagnosis:    ICD-10-CM   1. Schizophrenia spectrum disorder with psychotic disorder type not yet determined (HCC)  F29 risperiDONE (RISPERDAL) 2 MG tablet    haloperidol (HALDOL) 5 MG tablet    benztropine (COGENTIN) 0.5 MG tablet      Past Psychiatric History: Schizophrenia Spectrum Disorder, 1 previous hospitalization [facility] 08/2021), and no Suicide attempts or Self Injurious Behavior.  Past Medical History:  Past Medical History:  Diagnosis Date   Asthma    Eczema    History reviewed. No pertinent surgical history.  Family Psychiatric History: Mother- Schizophrenia  Family History: History reviewed. No pertinent family history.  Social History:  Social History   Socioeconomic History   Marital status: Single    Spouse name: Not on file   Number of children: 0   Years of education: Not on file   Highest education level: High school graduate  Occupational History   Occupation: Social research officer, government     Comment: Museum/gallery curator  Tobacco Use   Smoking status: Every Day    Packs/day: 0.25    Types: Cigarettes   Smokeless tobacco: Never  Tobacco comments:    Interested in nicorette gum, will be ordered.  Substance and Sexual Activity   Alcohol use: Yes    Comment: occassionl   Drug use: Yes    Types: Marijuana   Sexual activity: Yes  Other Topics Concern   Not on file  Social History Narrative   Not on file   Social Determinants of Health   Financial Resource Strain: Not on file  Food Insecurity: Not on file  Transportation Needs: Not on file  Physical Activity: Not on file  Stress: Not on file  Social Connections: Not on file    Allergies:  Allergies  Allergen Reactions   Iodinated Contrast Media Other (See Comments)    Irritated skin   Shellfish Allergy Swelling   Peanut-Containing  Drug Products Swelling    Metabolic Disorder Labs: Lab Results  Component Value Date   HGBA1C 5.2 09/06/2021   MPG 102.54 09/06/2021   MPG 108.28 02/10/2020   Lab Results  Component Value Date   PROLACTIN 3.2 (L) 09/08/2021   PROLACTIN 4.3 02/10/2020   Lab Results  Component Value Date   CHOL 211 (H) 09/08/2021   TRIG 49 09/08/2021   HDL 102 09/08/2021   CHOLHDL 2.1 09/08/2021   VLDL 10 09/08/2021   LDLCALC 99 09/08/2021   LDLCALC 102 (H) 09/06/2021   Lab Results  Component Value Date   TSH 2.994 09/08/2021   TSH 1.974 09/06/2021    Therapeutic Level Labs: No results found for: "LITHIUM" No results found for: "VALPROATE" No results found for: "CBMZ"  Current Medications: Current Outpatient Medications  Medication Sig Dispense Refill   benztropine (COGENTIN) 0.5 MG tablet Take 1 tablet (0.5 mg total) by mouth 2 (two) times daily. 60 tablet 1   haloperidol (HALDOL) 5 MG tablet Take 1 tablet (5 mg total) by mouth daily. 30 tablet 1   hydrocerin (EUCERIN) CREA Apply 1 Application topically 2 (two) times daily.  0   hydrocortisone cream 1 % Apply 1 Application topically 2 (two) times daily as needed for itching.     nicotine polacrilex (NICORETTE) 2 MG gum Take 1 each (2 mg total) by mouth as needed for smoking cessation. 20 tablet 0   predniSONE (DELTASONE) 20 MG tablet Take 1 tablet (20 mg total) by mouth daily with breakfast. 5 tablet 0   risperiDONE (RISPERDAL) 2 MG tablet Take 1 tablet (2 mg total) by mouth at bedtime. 30 tablet 1   triamcinolone cream (KENALOG) 0.1 % Apply 1 Application topically 2 (two) times daily. 30 g 0   No current facility-administered medications for this visit.     Musculoskeletal: Strength & Muscle Tone: within normal limits Gait & Station: normal Patient leans: N/A  Psychiatric Specialty Exam: Review of Systems  Respiratory:  Negative for cough and shortness of breath.   Cardiovascular:  Negative for chest pain.   Gastrointestinal:  Negative for abdominal pain, constipation, diarrhea, nausea and vomiting.  Neurological:  Negative for dizziness, weakness and headaches.  Psychiatric/Behavioral:  Positive for dysphoric mood. Negative for agitation, behavioral problems, hallucinations, self-injury, sleep disturbance and suicidal ideas. The patient is not nervous/anxious.     Blood pressure 125/81, pulse 67, height 6\' 1"  (1.854 m), weight 171 lb 3.2 oz (77.7 kg), SpO2 100 %.Body mass index is 22.59 kg/m.  General Appearance: Casual  Eye Contact:  Fair  Speech:  Clear and Coherent and Normal Rate  Volume:  Normal  Mood:  Dysphoric  Affect:  Congruent  Thought Process:  Linear  Orientation:  Full (Time, Place, and Person)  Thought Content: Logical   Suicidal Thoughts:  No  Homicidal Thoughts:  No  Memory:  Immediate;   Fair Recent;   Fair  Judgement:  Fair  Insight:  Fair  Psychomotor Activity:  Normal  Concentration:  Concentration: Good and Attention Span: Good  Recall:  [employer] of Knowledge: Fair  Language: Good  Akathisia:  Negative  Handed:  Right  AIMS (if indicated): done AIMS= 0  Assets:  Communication Skills Desire for Improvement Housing Physical Health Resilience Social Support  ADL's:  Intact  Cognition: WNL  Sleep:  Good   Screenings: AIMS    Flowsheet Row [facility]  AIMS Total Score 2      AUDIT    Flowsheet Row Ad[facility] ([facility]  Alcohol Use Disorder Identification Test Final Score (AUDIT) 4      GAD-7    Flowsheet Row Office Visit from 11/19/2016 in [facility]  Total GAD-7 Score 2      PHQ2-9    Flowsheet Row Office Visit from 10/14/2021 in [facility] Office Visit from 11/19/2016 in [facility]  PHQ-2 Total Score 0 3   PHQ-9 Total Score 0 4      Flowsheet Row Ad[facility]) [facility] from 09/08/2021 in [facility] ED from 08/10/2021 in [facility] RISK CATEGORY No Risk No Risk No Risk        Assessment and Plan:  Malikai Gut is a 31 yr old male who presents for follow up and for medication management.  PPHx is significant for Schizophrenia Spectrum Disorder, 1 previous hospitalization [facility] 08/2021), and no Suicide attempts or Self Injurious Behavior.   Zigmund Daniel did get his Risperdal after our last appointment and started taking it without any issues.  He is interested in starting an LAI so we begin the paperwork for patient assistance for [facility].  We will keep his medications where they are for now while we await the decision on patient assistance.  He will follow-up in 4 weeks as we should hear back with the decision by that point.  He still does have some paranoia, however, it is not distressing to him and he remained stable.  We will continue to monitor.   Unspecified schizophrenia spectrum and other psychotic d/o: -Continue Haldol 5 mg daily for psychosis/paranoia. 30 tablets with 1 refill. -Continue Cogentin 0.5 mg BID for EPS prophylaxis 60 tablets with 1 refill. -Continue Risperdal 2 mg QHS for psychosis/paranoia.  30 tablets with 1 refill.   Collaboration of Care:   Patient/Guardian was advised Release of Information must be obtained prior to any record release in order to collaborate their care with an outside provider. Patient/Guardian was advised if they have not already done so to contact the registration department to sign all necessary forms in order for [place] to release information regarding their care.   Consent: Patient/Guardian gives verbal consent for treatment and assignment of benefits for services provided during this visit. Patient/Guardian  expressed understanding and agreed to proceed.    Larry Franklin, MD 11/11/2021, 11:10 AM

## 2021-11-18 ENCOUNTER — Ambulatory Visit (INDEPENDENT_AMBULATORY_CARE_PROVIDER_SITE_OTHER): Payer: Self-pay | Admitting: Neurology

## 2021-11-18 ENCOUNTER — Encounter: Payer: Self-pay | Admitting: Neurology

## 2021-11-18 VITALS — BP 109/70 | HR 57 | Resp 18 | Ht 73.0 in | Wt 173.0 lb

## 2021-11-18 DIAGNOSIS — R9089 Other abnormal findings on diagnostic imaging of central nervous system: Secondary | ICD-10-CM

## 2021-11-18 NOTE — Patient Instructions (Signed)
Good to meet you. Everything looks good from a neurological standpoint. Continue follow-up with your psychiatrist. Wishing you the best. Follow-up as needed, call for any changes.
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
## 2021-12-09 ENCOUNTER — Encounter (HOSPITAL_COMMUNITY): Payer: Self-pay

## 2021-12-09 ENCOUNTER — Ambulatory Visit (INDEPENDENT_AMBULATORY_CARE_PROVIDER_SITE_OTHER): Payer: No Payment, Other | Admitting: *Deleted

## 2021-12-09 ENCOUNTER — Encounter (HOSPITAL_COMMUNITY): Payer: Self-pay | Admitting: Student in an Organized Health Care Education/Training Program

## 2021-12-09 ENCOUNTER — Ambulatory Visit (INDEPENDENT_AMBULATORY_CARE_PROVIDER_SITE_OTHER): Payer: No Payment, Other | Admitting: Student in an Organized Health Care Education/Training Program

## 2021-12-09 VITALS — BP 143/96 | HR 64 | Ht 73.0 in | Wt 171.8 lb

## 2021-12-09 DIAGNOSIS — F29 Unspecified psychosis not due to a substance or known physiological condition: Secondary | ICD-10-CM

## 2021-12-09 MED ORDER — RISPERIDONE 2 MG PO TABS: 2.0000 mg | ORAL_TABLET | Freq: Every day | ORAL | 0 refills | Status: DC

## 2021-12-09 MED ORDER — PALIPERIDONE PALMITATE ER 156 MG/ML IM SUSY
156.0000 mg | PREFILLED_SYRINGE | INTRAMUSCULAR | Status: DC
  Administered 2022-01-28 – 2022-07-16 (×7): 156 mg via INTRAMUSCULAR

## 2021-12-09 MED ORDER — HALOPERIDOL 5 MG PO TABS: 5.0000 mg | ORAL_TABLET | Freq: Every day | ORAL | 1 refills | Status: DC

## 2021-12-09 MED ORDER — BENZTROPINE MESYLATE 0.5 MG PO TABS
0.5000 mg | ORAL_TABLET | Freq: Two times a day (BID) | ORAL | 1 refills | Status: DC
Start: 2021-12-09 — End: 2022-05-21

## 2021-12-09 NOTE — Progress Notes (Signed)
Seen this am by Dr Kai Levins and started on an [place] injection of 156 mg. Given today in his R DELTOID. 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 Discussed a three month Lorayne Bender Trinza shot in the future if he remains stable and compliant and that it is his and the providers decision not mine I am giving him info only. He is cooperative and pleasant but minimally verbal with Probation officer. He is scheduled back for his next shot in 28 days. I had completed patient assistance paperwork with him for the invega but have not received a shot for him from the program. I copied the paperwork from Aug when initiated and took it back downstairs to the pharmacy to process. I used a sample for him today.

## 2021-12-09 NOTE — Progress Notes (Signed)
BH MD/PA/NP OP Progress Note  12/09/2021 11:14 AM CAULIN BEGLEY  MRN:  [id number]  Chief Complaint:  Chief Complaint  Patient presents with   Follow-up   HPI:  Random Dobrowski is a 31 yr old male who presents for follow up and for medication management.  PPHx is significant for Schizophrenia Spectrum Disorder, 1 previous hospitalization [facility] 08/2021), and no Suicide attempts or Self Injurious Behavior.  He reports that he has been doing well on his medications.  He reports no side effects from his medications.  He reports that he is now living with his father after breaking up with his fiance.  He reports that she has gone to [place] for training to become a flight attendant.  He reports he has not been doing many things since the break-up.  He reports it feels like all he does is go to work and is not taking time for himself.  He reports that he realizes he needs time for himself to do things he wants to do.  He reports that he still talks with his fiance daily and that they may get back together.  He reports that he has been thinking of joining the [employer].  He reports he spoken with recruiter as well as his family.  His family is recommending against him joining the [employer] however, his fiance is supportive of him because she states she knows that he has wanted to do this for a while.  Discussed that this is not something to likely enter and to consider all options both benefits and risks of either choice.  He states he will make a decision until his fiance finishes flight in school which should be around October 20.  He reports he is still interested in obtaining the LAI medication.  Discussed we would be able to give him his first dose today and he would need to continue taking the oral medication for 2 weeks as a bridge and he reported understanding.  He reports no SI, HI, or AVH and reports no thoughts of paranoia.  He reports he is sleeping well.  He reports his appetite is doing good.   He reports no other concerns at present.  He will return for follow-up in approximately 4 weeks.   Visit Diagnosis:    ICD-10-CM   1. Schizophrenia spectrum disorder with psychotic disorder type not yet determined (HCC)  F29 risperiDONE (RISPERDAL) 2 MG tablet    haloperidol (HALDOL) 5 MG tablet    benztropine (COGENTIN) 0.5 MG tablet    paliperidone (INVEGA SUSTENNA) injection 156 mg      Past Psychiatric History: Schizophrenia Spectrum Disorder, 1 previous hospitalization [facility] 08/2021), and no Suicide attempts or Self Injurious Behavior  Past Medical History:  Past Medical History:  Diagnosis Date   Asthma    Eczema    History reviewed. No pertinent surgical history.  Family Psychiatric History: Mother- Schizophrenia  Family History: History reviewed. No pertinent family history.  Social History:  Social History   Socioeconomic History   Marital status: Single    Spouse name: Not on file   Number of children: 0   Years of education: Not on file   Highest education level: High school graduate  Occupational History   Occupation: Social research officer, government     Comment: Museum/gallery curator  Tobacco Use   Smoking status: Every Day    Packs/day: 0.25    Types: Cigarettes   Smokeless tobacco: Never   Tobacco comments:    Interested in nicorette  gum, will be ordered.  Substance and Sexual Activity   Alcohol use: Yes    Comment: occassionl   Drug use: Yes    Types: Marijuana   Sexual activity: Yes  Other Topics Concern   Not on file  Social History Narrative   Not on file   Social Determinants of Health   Financial Resource Strain: Not on file  Food Insecurity: Not on file  Transportation Needs: Not on file  Physical Activity: Not on file  Stress: Not on file  Social Connections: Not on file    Allergies:  Allergies  Allergen Reactions   Iodinated Contrast Media Other (See Comments)    Irritated skin   Shellfish Allergy Swelling   Peanut-Containing Drug Products  Swelling    Metabolic Disorder Labs: Lab Results  Component Value Date   HGBA1C 5.2 09/06/2021   MPG 102.54 09/06/2021   MPG 108.28 02/10/2020   Lab Results  Component Value Date   PROLACTIN 3.2 (L) 09/08/2021   PROLACTIN 4.3 02/10/2020   Lab Results  Component Value Date   CHOL 211 (H) 09/08/2021   TRIG 49 09/08/2021   HDL 102 09/08/2021   CHOLHDL 2.1 09/08/2021   VLDL 10 09/08/2021   LDLCALC 99 09/08/2021   LDLCALC 102 (H) 09/06/2021   Lab Results  Component Value Date   TSH 2.994 09/08/2021   TSH 1.974 09/06/2021    Therapeutic Level Labs: No results found for: "LITHIUM" No results found for: "VALPROATE" No results found for: "CBMZ"  Current Medications: Current Outpatient Medications  Medication Sig Dispense Refill   benztropine (COGENTIN) 0.5 MG tablet Take 1 tablet (0.5 mg total) by mouth 2 (two) times daily. 60 tablet 1   haloperidol (HALDOL) 5 MG tablet Take 1 tablet (5 mg total) by mouth daily. 30 tablet 1   hydrocerin (EUCERIN) CREA Apply 1 Application topically 2 (two) times daily.  0   hydrocortisone cream 1 % Apply 1 Application topically 2 (two) times daily as needed for itching.     nicotine polacrilex (NICORETTE) 2 MG gum Take 1 each (2 mg total) by mouth as needed for smoking cessation. 20 tablet 0   predniSONE (DELTASONE) 20 MG tablet Take 1 tablet (20 mg total) by mouth daily with breakfast. 5 tablet 0   risperiDONE (RISPERDAL) 2 MG tablet Take 1 tablet (2 mg total) by mouth at bedtime. 14 tablet 0   triamcinolone cream (KENALOG) 0.1 % Apply 1 Application topically 2 (two) times daily. 30 g 0   Current Facility-Administered Medications  Medication Dose Route Frequency Provider Last Rate Last Admin   paliperidone (INVEGA SUSTENNA) injection 156 mg  156 mg Intramuscular Q30 days Zaynah Chawla, Mardelle Matte, MD         Musculoskeletal: Strength & Muscle Tone: within normal limits Gait & Station: normal Patient leans: N/A  Psychiatric Specialty  Exam: Review of Systems  Respiratory:  Negative for cough and shortness of breath.   Cardiovascular:  Negative for chest pain.  Gastrointestinal:  Negative for abdominal pain, constipation, diarrhea, nausea and vomiting.  Neurological:  Negative for weakness and headaches.  Psychiatric/Behavioral:  Positive for dysphoric mood. Negative for agitation, confusion, hallucinations, self-injury, sleep disturbance and suicidal ideas. The patient is not nervous/anxious.     Blood pressure (!) 143/96, pulse 64, height 6\' 1"  (1.854 m), weight 171 lb 12.8 oz (77.9 kg), SpO2 100 %.Body mass index is 22.67 kg/m.  General Appearance: Casual, Fairly Groomed, and wearing windbreaker with hi-vis vest over it  Eye  Contact:  Fair  Speech:  Clear and Coherent and Normal Rate  Volume:  Normal  Mood:  Dysphoric  Affect:  Congruent  Thought Process:  Coherent and Goal Directed  Orientation:  Full (Time, Place, and Person)  Thought Content: WDL and Logical   Suicidal Thoughts:  No  Homicidal Thoughts:  No  Memory:  Immediate;   Good Recent;   Good  Judgement:  Fair  Insight:  Fair  Psychomotor Activity:  Normal  Concentration:  Concentration: Good and Attention Span: Good  Recall:  Good  Fund of Knowledge: Good  Language: Good  Akathisia:  Negative  Handed:  Right  AIMS (if indicated): done AIMS=0  Assets:  Communication Skills Desire for Improvement Housing Physical Health Resilience Social Support  ADL's:  Intact  Cognition: WNL  Sleep:  Good   Screenings: AIMS    Flowsheet Row Admission (Discharged) from 09/09/2021 in [place] Total Score 2      AUDIT    Flowsheet Row Admission (Discharged) from 09/09/2021 in [place] [facility]  Alcohol Use Disorder Identification Test Final Score (AUDIT) 4      GAD-7    Flowsheet Row Office Visit from 11/19/2016 in [place]  Total GAD-7 Score 2       PHQ2-9    [place] Office Visit from 10/14/2021 in [place] Office Visit from 11/19/2016 in [place]  PHQ-2 Total Score 0 3  PHQ-9 Total Score 0 4      Flowsheet Row Admission (Discharged) from 09/09/2021 in [place] ED from 09/08/2021 in [facility] ED from 08/10/2021 in [facility] [place] No Risk No Risk No Risk        Assessment and Plan:  Leonidas Boateng is a 31 yr old male who presents for follow up and for medication management.  PPHx is significant for Schizophrenia Spectrum Disorder, 1 previous hospitalization [facility] 08/2021), and no Suicide attempts or Self Injurious Behavior.   Merrily Pew continues to do well on his medications.  He is still interested in starting an LAI so we will transition him to [place] 156 which he will receive today.  He will continue with oral Risperdal for 2 weeks for a bridge.  We will not make any other changes to his medications.  He will return for follow-up in approximately 4 weeks.   Unspecified schizophrenia spectrum and other psychotic d/o: -Continue Haldol 5 mg daily for psychosis/paranoia. 30 tablets with 1 refill. -Continue Cogentin 0.5 mg BID for EPS prophylaxis 60 tablets with 1 refill. -Continue Risperdal 2 mg QHS for  -Start Invega Sustenna 156 q28 days for psychosis/paranoia.  12 refills.   Collaboration of Care:   Patient/Guardian was advised Release of Information must be obtained prior to any record release in order to collaborate their care with an outside provider. Patient/Guardian was advised if they have not already done so to contact the registration department to sign all necessary forms in order for [place] to release information regarding their care.   Consent: Patient/Guardian gives verbal consent for treatment and assignment of benefits for  services provided during this visit. Patient/Guardian expressed understanding and agreed to proceed.    Briant Cedar, MD 12/09/2021, 11:14 AM

## 2021-12-17 ENCOUNTER — Other Ambulatory Visit (HOSPITAL_COMMUNITY): Payer: Self-pay | Admitting: Psychiatry

## 2021-12-17 ENCOUNTER — Encounter (HOSPITAL_COMMUNITY): Payer: Self-pay | Admitting: Student in an Organized Health Care Education/Training Program

## 2021-12-17 MED ORDER — PALIPERIDONE PALMITATE ER 156 MG/ML IM SUSY
156.0000 mg | PREFILLED_SYRINGE | Freq: Once | INTRAMUSCULAR | Status: AC
  Administered 2021-12-31: 156 mg via INTRAMUSCULAR

## 2021-12-17 NOTE — Progress Notes (Signed)
Patient being transitioned from PO Risperdal to [place] - received 1st loading dose on 12/09/21 with 156 mg (234 mg dose not available at the time via medication assistance). Was due for next injection yesterday; clinic to call to have patient come in today to receive 2nd loading dose. Patient has been continued on 2-week oral overlap given subtherapeutic initial loading dose. Clinic provider to assess tolerability of initial loading dose prior to administering 2nd dose today.  Plan:  -- OTO placed for Invega Sustenna IM 156 mg to be given today; will then be transitioned to monthly injection -- Continue Risperdal 2 mg QHS overlap (2 weeks total) -- Continue Haldol 5 mg daily -- Continue Cogentin 0.5 mg BID  Next follow-up scheduled with Dr. Kai Levins 01/06/22.  Alda Berthold, MD 12/17/21

## 2021-12-18 ENCOUNTER — Other Ambulatory Visit (HOSPITAL_COMMUNITY): Payer: Self-pay | Admitting: Student in an Organized Health Care Education/Training Program

## 2021-12-31 ENCOUNTER — Ambulatory Visit (INDEPENDENT_AMBULATORY_CARE_PROVIDER_SITE_OTHER): Payer: No Payment, Other | Admitting: *Deleted

## 2021-12-31 ENCOUNTER — Encounter (HOSPITAL_COMMUNITY): Payer: Self-pay

## 2021-12-31 VITALS — BP 114/74 | HR 75 | Ht 73.0 in | Wt 180.0 lb

## 2021-12-31 DIAGNOSIS — F29 Unspecified psychosis not due to a substance or known physiological condition: Secondary | ICD-10-CM | POA: Diagnosis not present

## 2021-12-31 NOTE — Progress Notes (Signed)
In today a week late for his Invega S 156 mg. He was to be here 2 weeks ago to complete the loading dose but despite writer calling him to invite him in he no showed. 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 He states started coming here because he thought people were following him. Education given on medicine and illness. He is to return in 28 days for his next shot. He asked for a work Quarry manager and Armed forces training and education officer wrote him one for today only for several hours off from warehouse work for his arm to feel better per his request. He got his shot today in his L DELTOID.

## 2022-01-06 ENCOUNTER — Ambulatory Visit (HOSPITAL_COMMUNITY): Payer: PRIVATE HEALTH INSURANCE

## 2022-01-06 ENCOUNTER — Encounter (HOSPITAL_COMMUNITY)
Payer: No Typology Code available for payment source | Admitting: Student in an Organized Health Care Education/Training Program

## 2022-01-28 ENCOUNTER — Encounter (HOSPITAL_COMMUNITY): Payer: Self-pay

## 2022-01-28 ENCOUNTER — Ambulatory Visit (INDEPENDENT_AMBULATORY_CARE_PROVIDER_SITE_OTHER): Payer: PRIVATE HEALTH INSURANCE | Admitting: *Deleted

## 2022-01-28 VITALS — BP 119/76 | HR 78 | Ht 73.0 in | Wt 181.0 lb

## 2022-01-28 DIAGNOSIS — F29 Unspecified psychosis not due to a substance or known physiological condition: Secondary | ICD-10-CM | POA: Diagnosis not present

## 2022-01-28 NOTE — Progress Notes (Signed)
In as scheduled for his Invega S 156 mg inj, given today in his R DELTOID without difficulty. He states he is "talking to " his former fiance and is happy about that. He remains flat, but is appropriate and verbal. He asked me if one of the side affects of his shot is depression. When asked he states his sad mood last about two days and he has not been feeling suicidal or homicidal. He denies any psychotic sx, history is his sx have been paranoia. He continues to work. He asked how long he would be on this shot, and he was told to plan on being on some type of medicine for his thinking and focus for a long time, it was management for his illness that at this time doesn't have a cure. He is to return in 28 days for his next inj.

## 2022-02-05 ENCOUNTER — Ambulatory Visit: Payer: Medicaid Other | Admitting: Student

## 2022-02-25 ENCOUNTER — Ambulatory Visit (HOSPITAL_COMMUNITY): Payer: No Typology Code available for payment source | Admitting: *Deleted

## 2022-02-25 ENCOUNTER — Encounter (HOSPITAL_COMMUNITY): Payer: Self-pay

## 2022-02-25 ENCOUNTER — Ambulatory Visit (INDEPENDENT_AMBULATORY_CARE_PROVIDER_SITE_OTHER): Payer: No Payment, Other | Admitting: Psychiatry

## 2022-02-25 VITALS — BP 116/70 | HR 64 | Ht 73.0 in | Wt 179.0 lb

## 2022-02-25 DIAGNOSIS — F29 Unspecified psychosis not due to a substance or known physiological condition: Secondary | ICD-10-CM

## 2022-02-25 NOTE — Progress Notes (Signed)
Patient arrived for injectionpaliperidone (INVEGA SUSTENNA) injection 156 mg  Tolerated well in Left Arm & Spoke of Christmas shopping. Pleasant as Always . NO HI/SI  NOR AH/VH ---[employer]

## 2022-02-25 NOTE — Progress Notes (Addendum)
BH MD/PA/NP OP Progress Note  02/26/2022 9:28 AM Larry Simmons  MRN:  [id number]  Chief Complaint:  Chief Complaint  Patient presents with   Follow-up    LAI   HPI:  Larry Simmons is a 31 yr old male who presents for LAI [place] [place]   PPHx is significant for Schizophrenia Spectrum Disorder, 1 previous hospitalization [facility] 08/2021), and no Suicide attempts or Self Injurious Behavior.  Pt reports that his mood is " good". He reports that his mood has been stable and denies any depression and anxiety .  He has been sleeping and eating well .  Currently, He denies any suicidal ideations, homicidal ideations, auditory and visual hallucinations.  He denies any medication side effects and has been tolerating it well. His last injection was on 01/28/22. He reports drinking 1-2 beer every other day.  He denies using any illicit drugs. He denies any other concerns. PGQ-2 scored at 1 Patient is alert and oriented x 4,  calm, cooperative, and fully engaged in conversation during the encounter.  His thought process is coherent with coherent speech . He does not appear to be responding to internal/external stimuli . AIMS-0     Visit Diagnosis:    ICD-10-CM   1. Schizophrenia spectrum disorder with psychotic disorder type not yet determined (HCC)  F29        Past Psychiatric History: Schizophrenia Spectrum Disorder, 1 previous hospitalization [facility] 08/2021), and no Suicide attempts or Self Injurious Behavior  Past Medical History:  Past Medical History:  Diagnosis Date   Asthma    Eczema    No past surgical history on file.  Family Psychiatric History: Mother- Schizophrenia  Family History: No family history on file.  Social History:  Social History   Socioeconomic History   Marital status: Single    Spouse name: Not on file   Number of children: 0   Years of education: Not on file   Highest education level: High school graduate  Occupational History   Occupation: Social research officer, government      Comment: Museum/gallery curator  Tobacco Use   Smoking status: Every Day    Packs/day: 0.25    Types: Cigarettes   Smokeless tobacco: Never   Tobacco comments:    Interested in nicorette gum, will be ordered.  Substance and Sexual Activity   Alcohol use: Yes    Comment: occassionl   Drug use: Yes    Types: Marijuana   Sexual activity: Yes  Other Topics Concern   Not on file  Social History Narrative   Not on file   Social Determinants of Health   Financial Resource Strain: Not on file  Food Insecurity: Not on file  Transportation Needs: Not on file  Physical Activity: Not on file  Stress: Not on file  Social Connections: Not on file    Allergies:  Allergies  Allergen Reactions   Iodinated Contrast Media Other (See Comments)    Irritated skin   Shellfish Allergy Swelling   Peanut-Containing Drug Products Swelling    Metabolic Disorder Labs: Lab Results  Component Value Date   HGBA1C 5.2 09/06/2021   MPG 102.54 09/06/2021   MPG 108.28 02/10/2020   Lab Results  Component Value Date   PROLACTIN 3.2 (L) 09/08/2021   PROLACTIN 4.3 02/10/2020   Lab Results  Component Value Date   CHOL 211 (H) 09/08/2021   TRIG 49 09/08/2021   HDL 102 09/08/2021   CHOLHDL 2.1 09/08/2021   VLDL 10 09/08/2021  LDLCALC 99 09/08/2021   LDLCALC 102 (H) 09/06/2021   Lab Results  Component Value Date   TSH 2.994 09/08/2021   TSH 1.974 09/06/2021    Therapeutic Level Labs: No results found for: "LITHIUM" No results found for: "VALPROATE" No results found for: "CBMZ"  Current Medications: Current Outpatient Medications  Medication Sig Dispense Refill   benztropine (COGENTIN) 0.5 MG tablet Take 1 tablet (0.5 mg total) by mouth 2 (two) times daily. 60 tablet 1   haloperidol (HALDOL) 5 MG tablet Take 1 tablet (5 mg total) by mouth daily. 30 tablet 1   hydrocerin (EUCERIN) CREA Apply 1 Application topically 2 (two) times daily.  0   hydrocortisone cream 1 % Apply 1  Application topically 2 (two) times daily as needed for itching.     nicotine polacrilex (NICORETTE) 2 MG gum Take 1 each (2 mg total) by mouth as needed for smoking cessation. 20 tablet 0   predniSONE (DELTASONE) 20 MG tablet Take 1 tablet (20 mg total) by mouth daily with breakfast. 5 tablet 0   risperiDONE (RISPERDAL) 2 MG tablet Take 1 tablet (2 mg total) by mouth at bedtime. 14 tablet 0   triamcinolone cream (KENALOG) 0.1 % Apply 1 Application topically 2 (two) times daily. 30 g 0   Current Facility-Administered Medications  Medication Dose Route Frequency Provider Last Rate Last Admin   paliperidone (INVEGA SUSTENNA) injection 156 mg  156 mg Intramuscular Q30 days Larry Franklin, MD   156 mg at 02/25/22 1414     Musculoskeletal: Strength & Muscle Tone: within normal limits Gait & Station: normal Patient leans: N/A  Psychiatric Specialty Exam: Review of Systems  Respiratory:  Negative for cough and shortness of breath.   Cardiovascular:  Negative for chest pain.  Gastrointestinal:  Negative for abdominal pain, constipation, diarrhea, nausea and vomiting.  Neurological:  Negative for weakness and headaches.  Psychiatric/Behavioral:  Positive for dysphoric mood. Negative for agitation, confusion, hallucinations, self-injury, sleep disturbance and suicidal ideas. The patient is not nervous/anxious.     There were no vitals taken for this visit.There is no height or weight on file to calculate BMI.  General Appearance: Casual, Fairly Groomed, and wearing windbreaker with hi-vis vest over it  Eye Contact:  Fair  Speech:  Clear and Coherent and Normal Rate  Volume:  Normal  Mood:  Dysphoric  Affect:  Congruent  Thought Process:  Coherent and Goal Directed  Orientation:  Full (Time, Place, and Person)  Thought Content: WDL and Logical   Suicidal Thoughts:  No  Homicidal Thoughts:  No  Memory:  Immediate;   Good Recent;   Good  Judgement:  Fair  Insight:  Fair   Psychomotor Activity:  Normal  Concentration:  Concentration: Good and Attention Span: Good  Recall:  Good  Fund of Knowledge: Good  Language: Good  Akathisia:  Negative  Handed:  Right  AIMS (if indicated): done AIMS=0  Assets:  Communication Skills Desire for Improvement Housing Physical Health Resilience Social Support  ADL's:  Intact  Cognition: WNL  Sleep:  Good   Screenings: AIMS    Flowsheet [facility]  AIMS Total Score 2      AUDIT    Flowsheet Row [facility]  Alcohol Use Disorder Identification Test Final Score (AUDIT) 4      GAD-7    Flowsheet Row Office Visit from 11/19/2016 in [facility]  Total GAD-7 Score 2      PHQ2-9    Flowsheet Row Clinical Support from 02/25/2022 in [facility] Office Visit from 10/14/2021 in [facility] Office Visit from 11/19/2016 in [facility]  PHQ-2 Total Score 1 0 3  PHQ-9 Total Score -- 0 4      Flowsheet Row [facility] from [facility]08/2021 [facility] from 08/10/2021 in [facility] RISK CATEGORY No Risk No Risk No Risk        Assessment and Plan:  Larry Simmons is a 31 yr old male who presents for LAI [place].  PPHx is significant for Schizophrenia Spectrum Disorder, 1 previous hospitalization [facility] 08/2021), and no Suicide attempts or Self Injurious Behavior. Pt has been tolerating Invega well without any side effects.   schizophrenia spectrum disorder -Continue Haldol 5 mg daily for psychosis/paranoia. -Continue Cogentin 0.5 mg BID for EPS prophylaxis -Continue Invega Sustenna 156 q28 days for  psychosis/paranoia.  12 refills.  Collaboration of Care:   Patient/Guardian was advised Release of Information must be obtained prior to any record release in order to collaborate their care with an outside provider. Patient/Guardian was advised if they have not already done so to contact the registration department to sign all necessary forms in order for [place] to release information regarding their care.   Consent: Patient/Guardian gives verbal consent for treatment and assignment of benefits for services provided during this visit. Patient/Guardian expressed understanding and agreed to proceed.    Larry Ro, MD 02/26/2022, 9:28 AM

## 2022-03-02 ENCOUNTER — Encounter (HOSPITAL_COMMUNITY): Payer: Self-pay | Admitting: Psychiatry

## 2022-03-25 ENCOUNTER — Ambulatory Visit (INDEPENDENT_AMBULATORY_CARE_PROVIDER_SITE_OTHER): Payer: No Payment, Other | Admitting: Psychiatry

## 2022-03-25 ENCOUNTER — Encounter (HOSPITAL_COMMUNITY): Payer: Self-pay

## 2022-03-25 ENCOUNTER — Ambulatory Visit (HOSPITAL_COMMUNITY): Payer: Self-pay

## 2022-03-25 VITALS — BP 120/74 | HR 73 | Resp 18 | Ht 73.0 in | Wt 181.0 lb

## 2022-03-25 DIAGNOSIS — F29 Unspecified psychosis not due to a substance or known physiological condition: Secondary | ICD-10-CM

## 2022-03-25 MED ORDER — BUPROPION HCL ER (XL) 150 MG PO TB24: 150.0000 mg | ORAL_TABLET | ORAL | 1 refills | Status: DC

## 2022-03-25 NOTE — Progress Notes (Addendum)
BH MD/PA/NP OP Progress Note  03/25/2022 2:37 PM Larry Simmons  MRN:  [id number]  Chief Complaint:  Chief Complaint  Patient presents with   Follow-up    LAI   HPI:  Larry Simmons is a 32 yr old male who presents for LAI [place]   PPHx is significant for Schizophrenia Spectrum Disorder, 1 previous hospitalization [facility] 08/2021), and no Suicide attempts or Self Injurious Behavior.   Pt reports that his mood is "depressed". He reports that his mood has been low since last visit and he has been having erectile dysfunction. He reports poor sleep and appetite.  Currently, He denies any suicidal ideations, homicidal ideations, auditory and visual hallucinations.  He reports that [place] helps with hallucinations. He is wondering of depression is due to [place]. He wants to continue taking injection but wants something for depression. He initially said he has been taking Haldol but later states that he has not been taking Haldol and cogentin as he was not able to afford it. He stopped haldol and cogentin by himself 2 months ago. He denies any other side effects and has been tolerating it well. His last injection was on 02/25/22. He reports drinking 1 beer every day and 1 cup of Liquor every other day.  He reports using Marijuana on Christmas. He denies any other concerns. He lives with his father Patient is alert and oriented x 4,  calm, cooperative, and fully engaged in conversation during the encounter.  His thought process is coherent with coherent speech . He does not appear to be responding to internal/external stimuli . AIMS-0     Visit Diagnosis:    ICD-10-CM   1. Schizophrenia spectrum disorder with psychotic disorder type not yet determined (HCC)  F29 buPROPion (WELLBUTRIN XL) 150 MG 24 hr tablet       Past Psychiatric History: Schizophrenia Spectrum Disorder, 1 previous hospitalization [facility] 08/2021), and no Suicide attempts or Self Injurious Behavior  Past Medical History:  Past  Medical History:  Diagnosis Date   Asthma    Eczema    No past surgical history on file.  Family Psychiatric History: Mother- Schizophrenia  Family History: No family history on file.  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  Substance and Sexual Activity   Alcohol use: Yes    Comment: occassionl   Drug use: Yes    Types: Marijuana   Sexual activity: Yes  Other Topics Concern   Not on file  Social History Narrative   Not on file   Social Determinants of Health   Financial Resource Strain: Not on file  Food Insecurity: Not on file  Transportation Needs: Not on file  Physical Activity: Not on file  Stress: Not on file  Social Connections: Not on file    Allergies:  Allergies  Allergen Reactions   Iodinated Contrast Media Other (See Comments)    Irritated skin   Shellfish Allergy Swelling   Peanut-Containing Drug Products Swelling  Metabolic Disorder Labs: Lab Results  Component Value Date   HGBA1C 5.2 09/06/2021   MPG 102.54 09/06/2021   MPG 108.28 02/10/2020   Lab Results  Component Value Date   PROLACTIN 3.2 (L) 09/08/2021   PROLACTIN 4.3 02/10/2020   Lab Results  Component Value Date   CHOL 211 (H) 09/08/2021   TRIG 49 09/08/2021   HDL 102 09/08/2021   CHOLHDL 2.1 09/08/2021   VLDL 10 09/08/2021   LDLCALC 99 09/08/2021   LDLCALC 102 (H) 09/06/2021   Lab Results  Component Value Date   TSH 2.994 09/08/2021   TSH 1.974 09/06/2021    Therapeutic Level Labs: No results found for: "LITHIUM" No results found  for: "VALPROATE" No results found for: "CBMZ"  Current Medications: Current Outpatient Medications  Medication Sig Dispense Refill   buPROPion (WELLBUTRIN XL) 150 MG 24 hr tablet Take 1 tablet (150 mg total) by mouth every morning. 30 tablet 1   benztropine (COGENTIN) 0.5 MG tablet Take 1 tablet (0.5 mg total) by mouth 2 (two) times daily. 60 tablet 1   haloperidol (HALDOL) 5 MG tablet Take 1 tablet (5 mg total) by mouth daily. 30 tablet 1   hydrocerin (EUCERIN) CREA Apply 1 Application topically 2 (two) times daily.  0   hydrocortisone cream 1 % Apply 1 Application topically 2 (two) times daily as needed for itching.     nicotine polacrilex (NICORETTE) 2 MG gum Take 1 each (2 mg total) by mouth as needed for smoking cessation. 20 tablet 0   predniSONE (DELTASONE) 20 MG tablet Take 1 tablet (20 mg total) by mouth daily with breakfast. 5 tablet 0   risperiDONE (RISPERDAL) 2 MG tablet Take 1 tablet (2 mg total) by mouth at bedtime. 14 tablet 0   triamcinolone cream (KENALOG) 0.1 % Apply 1 Application topically 2 (two) times daily. 30 g 0   Current Facility-Administered Medications  Medication Dose Route Frequency Provider Last Rate Last Admin   paliperidone (INVEGA SUSTENNA) injection 156 mg  156 mg Intramuscular Q30 days Briant Cedar, MD   156 mg at 03/25/22 1428     Musculoskeletal: Strength & Muscle Tone: within normal limits Gait & Station: normal Patient leans: N/A  Psychiatric Specialty Exam: Review of Systems  Respiratory:  Negative for cough and shortness of breath.   Cardiovascular:  Negative for chest pain.  Gastrointestinal:  Negative for abdominal pain, constipation, diarrhea, nausea and vomiting.  Genitourinary:        Patient complaining of erectile dysfunction  Neurological:  Negative for weakness and headaches.  Psychiatric/Behavioral:  Positive for dysphoric mood. Negative for agitation, confusion, hallucinations, self-injury, sleep disturbance and suicidal  ideas. The patient is not nervous/anxious.     There were no vitals taken for this visit.There is no height or weight on file to calculate BMI.  General Appearance:  Fairly groomed   Eye Contact:  Fair  Speech:  Clear and Coherent and Normal Rate  Volume:  Normal  Mood:  Dysphoric  Affect:  Congruent  Thought Process:  Coherent and Goal Directed  Orientation:  Full (Time, Place, and Person)  Thought Content: WDL and Logical   Suicidal Thoughts:  No  Homicidal Thoughts:  No  Memory:  Immediate;   Good Recent;   Good  Judgement:  Fair  Insight:  Fair  Psychomotor Activity:  Normal  Concentration:  Concentration: Good and Attention Span: Good  Recall:  Good  Fund of Knowledge: Good  Language: Good  Akathisia:  Negative  Handed:  Right  AIMS (if indicated): done AIMS=0  Assets:  Communication Skills Desire for Improvement Housing Physical Health Resilience Social Support  ADL's:  Intact  Cognition: WNL  Sleep:  Fair   Screenings: AIMS    Flowsheet Row [facility]  AIMS Total Score 2      AUDIT    Flowsheet Row [facility]  Alcohol Use Disorder Identification Test Final Score (AUDIT) 4      GAD-7    Flowsheet Row Office Visit from 11/19/2016 in [facility]  Total GAD-7 Score 2      PHQ2-9    Flowsheet Row Clinical Support from 02/25/2022 in [facility] Office Visit from 10/14/2021 in [facility] Office Visit from 11/19/2016 in [facility]  PHQ-2 Total Score 1 0 3  PHQ-9 Total Score -- 0 4      Flowsheet Row Ad[facility]) [facility] from 09/08/2021 in [facility] ED from 08/10/2021 in [facility] at [facility] RISK CATEGORY No Risk No Risk No Risk        Assessment and Plan:  Larry Simmons is a 32 yr old male who presents for LAI [place].  PPHx is significant for Schizophrenia Spectrum Disorder, 1 previous hospitalization [facility] 08/2021), and no Suicide attempts or Self Injurious Behavior. Pt has been tolerating Invega well. He has stopped haldol and cogentin 2 months ago by himself due to insurance issues. He is complaining of erectile dysfunction and depression. Will start wellbutrin for depression. Patient will need to follow up with outpatient provider in 2-3 weeks.   Schizophrenia spectrum disorder -Haldol and cogentin stopped due to non compliance -Continue Invega Sustenna 156 q28 days for psychosis/paranoia.  - Start Wellbutrin 150 mg XL daily for Depression. 30 days prescription with 1 refill sent to patient's pharmacy   He needs to be connected to Outpatient Provider for follow  up in 2-3 weeks.   Collaboration of Care: Dr Lucianne Muss  Patient/Guardian was advised Release of Information must be obtained prior to any record release in order to collaborate their care with an outside provider. Patient/Guardian was advised if they have not already done so to contact the registration department to sign all necessary forms in order for [place] to release information regarding their care.   Consent: Patient/Guardian gives verbal consent for treatment and assignment of benefits for services provided during this visit. Patient/Guardian expressed understanding and agreed to proceed.    Karsten Ro, MD 03/25/2022, 2:37 PM

## 2022-03-25 NOTE — Progress Notes (Signed)
Patient arrived for injectionpaliperidone (INVEGA SUSTENNA) injection 156 mg  Tolerated well in Right Arm. Pleasant as Always . NO HI/SI  NOR AH/VH ---[employer]

## 2022-03-31 ENCOUNTER — Encounter (HOSPITAL_COMMUNITY): Payer: Self-pay | Admitting: Psychiatry

## 2022-04-16 ENCOUNTER — Encounter (HOSPITAL_COMMUNITY): Payer: Self-pay | Admitting: Registered Nurse

## 2022-04-16 ENCOUNTER — Encounter (HOSPITAL_COMMUNITY): Payer: Self-pay

## 2022-04-16 ENCOUNTER — Other Ambulatory Visit (HOSPITAL_COMMUNITY): Payer: Self-pay | Admitting: Registered Nurse

## 2022-04-16 ENCOUNTER — Ambulatory Visit (INDEPENDENT_AMBULATORY_CARE_PROVIDER_SITE_OTHER): Payer: Self-pay | Admitting: *Deleted

## 2022-04-16 VITALS — BP 113/79 | HR 83 | Temp 98.1°F | Resp 16 | Ht 73.0 in | Wt 179.0 lb

## 2022-04-16 DIAGNOSIS — F29 Unspecified psychosis not due to a substance or known physiological condition: Secondary | ICD-10-CM

## 2022-04-16 MED ORDER — BUPROPION HCL ER (XL) 150 MG PO TB24: 150.0000 mg | ORAL_TABLET | ORAL | 3 refills | Status: DC

## 2022-04-16 NOTE — Progress Notes (Signed)
Patient arrived for injection of Invega 156mg . Given in Left Deltoid without issues or complaints. Denies SI/HI or AV hallucinations. Asked to speak with the MD because he has been having sexual side effects. Concerned that he won't be able to have children and he wants to pursue that opportunity. NP went to speak with patient before injection. He was compliant with injection but states he won't be back for more if the sexual side effects continue.

## 2022-04-26 ENCOUNTER — Ambulatory Visit (HOSPITAL_COMMUNITY)
Admission: EM | Admit: 2022-04-26 | Discharge: 2022-04-26 | Disposition: A | Discharge status: Home / Self Care | Payer: No Payment, Other | Attending: Psychiatry | Admitting: Psychiatry

## 2022-04-26 DIAGNOSIS — F320 Major depressive disorder, single episode, mild: Secondary | ICD-10-CM | POA: Insufficient documentation

## 2022-04-26 NOTE — ED Provider Notes (Cosign Needed Addendum)
Behavioral Health Urgent Care Medical Screening Exam  Patient Name: Larry Simmons MRN: [id number] Date of Evaluation: 04/26/22 Chief Complaint:  seeking therapy services Diagnosis:  Final diagnoses:  Current mild episode of major depressive disorder, unspecified whether recurrent ([place])    History of Present illness: Larry Simmons is a 32 y.o. male.  Presents to [facility] urgent care with reports of worsening depression and seeking therapy services.  He reports he woke up this morning and started feeling down and depressed because his monthly injection has taken away his sex drive.  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 He reports he is hopeful to have 2 kids and would like his sex drive to improve.  Larry Simmons reports he currently resides with his father. Stated that he has stopped smoking.   He is denying suicidal or homicidal ideations denies auditory visual hallucinations.  Reports he has been taking medications as indicated.  Patient has a charted history with schizophrenia spectrum disorder.  Denied any medication side effects.  Denied that he is followed by therapy currently.  Offered to follow-up with therapy services walk-in clinic additionally attended partial hospitalization programming.  Patient was receptive to plan.  Larry Simmons is sitting in no acute distress. he is alert/oriented x 4; calm/cooperative; and mood congruent with affect. he is speaking in a clear tone at moderate volume, and normal pace; with good eye contact.  He thought process is coherent and relevant; There is no indication that he is currently responding to internal/external stimuli or experiencing delusional thought content; and he has denied suicidal/self-harm/homicidal ideation, psychosis, and paranoia.   Patient has remained calm throughout assessment and has answered questions appropriately.     Larry Simmons is educated and verbalizes understanding of mental health resources and other crisis services in the community. He is instructed to call 911 and present to the nearest emergency room should he experience any suicidal/homicidal ideation, auditory/visual/hallucinations, or detrimental worsening of his mental health condition.  he was a also advised by Probation officer that he could call the toll-free phone on insurance card to assist with identifying in network counselors and agencies or number on back of Medicaid card to speak with care coordinator   [place] ED from 04/26/2022 in [facility] Admission (Discharged) from 09/09/2021 in [place] ED from 09/08/2021 in [place] No Risk No Risk No Risk       Psychiatric Specialty Exam  Presentation  General Appearance:Appropriate for Environment  Eye Contact:Good  Speech:Clear and Coherent  Speech Volume:Normal  Handedness:Right   Mood and Affect  Mood: Depressed  Affect: Congruent   Thought Process  Thought Processes: Coherent  Descriptions of Associations:Intact  Orientation:Full (Time, Place and Person)  Thought Content:Logical  Diagnosis of Schizophrenia or Schizoaffective disorder in past: No  Duration of Psychotic Symptoms: Greater than six months  Hallucinations:None  Ideas of Reference:None  Suicidal Thoughts:No  Homicidal Thoughts:No   Sensorium  Memory: Immediate Fair; Recent Fair; Remote Fair  Judgment: Fair  Insight: Fair   Community education officer  Concentration: Fair  Attention Span: Fair  Recall: Good  Fund of Knowledge: Good  Language: Good   Psychomotor Activity  Psychomotor Activity: Normal   Assets  Assets: Desire for Improvement   Sleep  Sleep: Fair  Number of hours:  6  Physical Exam: Physical Exam Vitals and nursing note  reviewed.  Cardiovascular:     Rate and Rhythm: Normal rate and regular rhythm.     Pulses: Normal pulses.     Heart sounds: Normal heart sounds.  Pulmonary:     Effort: Pulmonary effort is normal.  Abdominal:     General: Abdomen is flat.  Neurological:     Mental Status: He is alert and oriented to person, place, and time.  Psychiatric:        Mood and Affect: Mood normal.        Thought Content: Thought content normal.    Review of Systems  Psychiatric/Behavioral:  Positive for depression. Negative for substance abuse and suicidal ideas. The patient is not nervous/anxious.   All other systems reviewed and are negative.  Blood pressure 131/80, pulse 81, temperature 97.6 F (36.4 C), temperature source Oral, SpO2 98 %. There is no height or weight on file to calculate BMI.  Musculoskeletal: Strength & Muscle Tone: within normal limits Gait & Station: normal Patient leans: N/A   [place] MSE Discharge Disposition for Follow up and Recommendations: Based on my evaluation the patient does not appear to have an emergency medical condition and can be discharged with resources and follow up care in outpatient services for Partial Hospitalization Program and Individual Therapy   Derrill [place], NP 04/26/2022, 3:01 PM

## 2022-04-26 NOTE — Discharge Instructions (Signed)

## 2022-04-26 NOTE — ED Triage Notes (Signed)
Pt Larry Simmons presents to [facility] seeking a mental health evaluation. Pt states "I'm not feeling like myself". Pt states he is seen in outpatient at [facility] for medication managment and has been prescibed medication for his symptoms but states he still gets depressed at times. Pt states he does not currently have a therapist but would like someone to talk to. Pt states he was supposed to go to work today but he was overwhelmed so he decided to come here instead. Pt denies SI/HI and AVH.

## 2022-05-14 ENCOUNTER — Encounter (HOSPITAL_COMMUNITY): Payer: Self-pay

## 2022-05-14 ENCOUNTER — Ambulatory Visit (HOSPITAL_COMMUNITY): Payer: No Payment, Other

## 2022-05-14 VITALS — BP 100/66 | HR 69 | Temp 97.8°F | Ht 73.0 in | Wt 180.0 lb

## 2022-05-14 DIAGNOSIS — F29 Unspecified psychosis not due to a substance or known physiological condition: Secondary | ICD-10-CM

## 2022-05-14 NOTE — Progress Notes (Signed)
Patient presented with flat affect, level and pleasant mood and stated he is having sexual dysfunction side effects to current [place] and wanted to discuss coming off the medication.  Patient met with this nurse with Dr. Eulis Canner, NP as he explained his sexual dysfunction issues and request to stop his current medication.  After discussion with NP, patient chose not to have the injection today but to reschedule for 1 week and to bring his Father in so they could all discuss and make sure everyone in agreement and so she would have more time to discuss any concerns for this plan.  Patient stated in agreement with this plan for now and may follow up with a PCP on sexual dysfunction as well.

## 2022-05-21 ENCOUNTER — Encounter (HOSPITAL_COMMUNITY): Payer: Self-pay

## 2022-05-21 ENCOUNTER — Ambulatory Visit (HOSPITAL_COMMUNITY): Payer: No Payment, Other

## 2022-05-21 ENCOUNTER — Encounter (HOSPITAL_COMMUNITY): Payer: Self-pay | Admitting: Psychiatry

## 2022-05-21 ENCOUNTER — Ambulatory Visit (INDEPENDENT_AMBULATORY_CARE_PROVIDER_SITE_OTHER): Payer: Self-pay | Admitting: Psychiatry

## 2022-05-21 ENCOUNTER — Ambulatory Visit (INDEPENDENT_AMBULATORY_CARE_PROVIDER_SITE_OTHER): Payer: Self-pay

## 2022-05-21 VITALS — BP 114/68 | HR 74 | Ht 73.0 in | Wt 178.0 lb

## 2022-05-21 DIAGNOSIS — N522 Drug-induced erectile dysfunction: Secondary | ICD-10-CM

## 2022-05-21 DIAGNOSIS — F29 Unspecified psychosis not due to a substance or known physiological condition: Secondary | ICD-10-CM

## 2022-05-21 MED ORDER — INVEGA SUSTENNA 156 MG/ML IM SUSY: 156.0000 mg | PREFILLED_SYRINGE | INTRAMUSCULAR | 11 refills | Status: DC

## 2022-05-21 MED ORDER — BUPROPION HCL ER (XL) 150 MG PO TB24: 150.0000 mg | ORAL_TABLET | ORAL | 3 refills | Status: DC

## 2022-05-21 NOTE — Telephone Encounter (Signed)
Message from Alm Bustard sent at 05/21/2022  2:11 PM EST  Summary: erectile dysfunction   Pt states that he just left [facility] and was told to call. Pt states that he is having erectile dysfunction and was told to call and ask about medication.  Please advise.         Chief Complaint: erectile dysfunction Symptoms: Ed for past 3-4 months Frequency: 4 months Pertinent Negatives: Patient denies pain, other concerns Disposition: '[]'$ ED /'[]'$ Urgent Care (no appt availability in office) / '[]'$ Appointment(In office/virtual)/ '[]'$  [facility]/ '[]'$ Home Care/ '[]'$ Refused Recommended Disposition /'[]'$ [place] [facility]/ '[x]'$  Follow-up with PCP Additional Notes: pt has upcoming appt scheduled. Placed appt on wait list.   Reason for Disposition  All other penis - scrotum symptoms  (Exception: Painless rash < 24 hours duration.)  Answer Assessment - Initial Assessment Questions 1. SYMPTOM: "What's the main symptom you're concerned about?" (e.g., discharge from penis, rash, pain, itching, swelling)     Erectile dysfunction  3. ONSET: "When did ED  start?"     3 -4 months 4. PAIN: "Is there any pain?" If Yes, ask: "How bad is it?"  (Scale 1-10; or mild, moderate, severe)     no 5. URINE: "Any difficulty passing urine?" If Yes, ask: "When was the last time?"     no 6. CAUSE: "What do you think is causing the symptoms?"     Medication he is taking- pt unsure as to which med 7. OTHER SYMPTOMS: "Do you have any other symptoms?" (e.g., fever, abdomen pain, blood in urine)     none  Protocols used: Penis and Scrotum Symptoms-A-AH

## 2022-05-21 NOTE — Progress Notes (Signed)
BH MD/PA/NP OP Progress Note  05/21/2022 2:11 PM Larry Simmons  MRN:  [id number]  Chief Complaint: "I dislike Invegas side effects"  HPI: 32 year old male seen today for follow up psychiatric history of schizophrenia and depression. He is currently managed on Invega  156 mg monthly and Wellbutrin XL 150 mg daily. He notes his medications are effective in managing his mental health but notes that he dislike physical side effects.   Today patient is well groomed, pleasant, cooperative, and engaged in concretion. He notes that he dislikes the sexual side effects of Invega. Patient reports that he is unable to have/ maintain an erection. He reports that this disrupts his relationship with his girlfriend. Patient notes that he want children in the future and wanted to discuss future options. Provider discussed switching antipsychotic, speaking to primary care doctor about medications to help manage erectile dysfunction, or lowering dose. Provider reccommended speaking to PCP about medications to assist with ED prior to switching antipsychotics. Provider informed patient that other antipsychotics has the potential to cause sexual dysfunction. He endorsed understanding and agreed. Patient referred to community health and wellness for primary care.  Today he notes that his anxiety and depression are well managed. Today provider conducted a GAD 7 and patient scored a 1. Provider also conducted a PHQ 9 and patient scored a 2. He endorses adequate sleep and appetite. Today denies SI/HI/VAH, mania, or paranoia.  Patient continues to work as a Administrator. He notes that he finds enjoyment in her job.   At this time no medication changes made. Patient will follow up with primary care to address erectile dysfunction. No other concerns noted at this time.  Visit Diagnosis:    ICD-10-CM   1. Drug-induced erectile dysfunction  N52.2       Past Psychiatric History:  Schizophrenia and depression  Past  Medical History:  Past Medical History:  Diagnosis Date   Asthma    Eczema    No past surgical history on file.  Family Psychiatric History: Mother- Schizophrenia   Family History: No family history on file.  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  Substance and Sexual Activity   Alcohol use: Yes    Alcohol/week: 4.0 standard drinks of alcohol    Types: 4 Cans of beer per week    Comment: occassionl   Drug use: Not Currently    Types: Marijuana   Sexual activity: Not Currently  Other Topics Concern   Not on file  Social History Narrative   Not on file   Social Determinants of Health   Financial Resource Strain: Not on file  Food Insecurity: Not on file  Transportation Needs: Not on file  Physical Activity: Not on file  Stress: Not on file  Social Connections: Not on file    Allergies:  Allergies  Allergen Reactions   Iodinated Contrast Media Other (See Comments)    Irritated skin   Shellfish Allergy Swelling   Peanut-Containing  Drug Products Swelling    Metabolic Disorder Labs: Lab Results  Component Value Date   HGBA1C 5.2 09/06/2021   MPG 102.54 09/06/2021   MPG 108.28 02/10/2020   Lab Results  Component Value Date   PROLACTIN 3.2 (L) 09/08/2021   PROLACTIN 4.3 02/10/2020   Lab Results  Component Value Date   CHOL 211 (H) 09/08/2021   TRIG 49 09/08/2021   HDL 102 09/08/2021   CHOLHDL 2.1 09/08/2021   VLDL 10 09/08/2021   LDLCALC 99 09/08/2021   LDLCALC 102 (H) 09/06/2021   Lab Results  Component Value Date    TSH 2.994 09/08/2021   TSH 1.974 09/06/2021    Therapeutic Level Labs: No results found for: "LITHIUM" No results found for: "VALPROATE" No results found for: "CBMZ"  Current Medications: Current Outpatient Medications  Medication Sig Dispense Refill   benztropine (COGENTIN) 0.5 MG tablet Take 1 tablet (0.5 mg total) by mouth 2 (two) times daily. (Patient not taking: Reported on 05/14/2022) 60 tablet 1   buPROPion (WELLBUTRIN XL) 150 MG 24 hr tablet Take 1 tablet (150 mg total) by mouth every morning. 30 tablet 3   haloperidol (HALDOL) 5 MG tablet Take 1 tablet (5 mg total) by mouth daily. (Patient not taking: Reported on 05/14/2022) 30 tablet 1   hydrocerin (EUCERIN) CREA Apply 1 Application topically 2 (two) times daily. (Patient not taking: Reported on 05/14/2022)  0   hydrocortisone cream 1 % Apply 1 Application topically 2 (two) times daily as needed for itching. (Patient not taking: Reported on 05/14/2022)     nicotine polacrilex (NICORETTE) 2 MG gum Take 1 each (2 mg total) by mouth as needed for smoking cessation. (Patient not taking: Reported on 05/14/2022) 20 tablet 0   predniSONE (DELTASONE) 20 MG tablet Take 1 tablet (20 mg total) by mouth daily with breakfast. (Patient not taking: Reported on 05/14/2022) 5 tablet 0   risperiDONE (RISPERDAL) 2 MG tablet Take 1 tablet (2 mg total) by mouth at bedtime. (Patient not taking: Reported on 05/14/2022) 14 tablet 0   triamcinolone cream (KENALOG) 0.1 % Apply 1 Application topically 2 (two) times daily. (Patient not taking: Reported on 05/14/2022) 30 g 0   Current Facility-Administered Medications  Medication Dose Route Frequency Provider Last Rate Last Admin   paliperidone (INVEGA SUSTENNA) injection 156 mg  156 mg Intramuscular Q30 days Briant Cedar, MD   156 mg at 04/16/22 1501     Musculoskeletal: Strength & Muscle Tone: within normal limits Gait & Station: normal Patient leans: N/A  Psychiatric Specialty Exam: Review of  Systems  There were no vitals taken for this visit.There is no height or weight on file to calculate BMI.  General Appearance: Well Groomed  Eye Contact:  Good  Speech:  Clear and Coherent and Normal Rate  Volume:  Normal  Mood:  Euthymic  Affect:  Appropriate and Congruent  Thought Process:  Coherent, Goal Directed, and Linear  Orientation:  Full (Time, Place, and Person)  Thought Content: WDL and Logical   Suicidal Thoughts:  No  Homicidal Thoughts:  No  Memory:  Immediate;   Good Recent;   Good Remote;   Good  Judgement:  Good  Insight:  Good  Psychomotor Activity:  Normal  Concentration:  Concentration: Good and Attention Span: Good  Recall:  Good  Fund of Knowledge: Good  Language: Good  Akathisia:  No  Handed:  Right  AIMS (if indicated): not done  Assets:  Communication Skills Desire for Improvement Financial  Resources/Insurance Housing Intimacy Leisure Time Physical Health Social Support  ADL's:  Intact  Cognition: WNL  Sleep:  Good   Screenings: AIMS    Flowsheet Row Admission (Discharged) from 09/09/2021 in [place] [facility]  AIMS Total Score 2      AUDIT    Flowsheet Row Admission (Discharged) from 09/09/2021 in [place] [facility]  Alcohol Use Disorder Identification Test Final Score (AUDIT) 4      GAD-7    Flowsheet Row Office Visit from 11/19/2016 in [place]  Total GAD-7 Score 2      PHQ2-9    Flowsheet Row Clinical Support from 02/25/2022 in [facility] Office Visit from 10/14/2021 in [place] Office Visit from 11/19/2016 in [place]  PHQ-2 Total Score 1 0 3  PHQ-9 Total Score -- 0 4      Flowsheet Row ED from 04/26/2022 in [facility] Admission (Discharged) from 09/09/2021 in [place]  [facility] ED from 09/08/2021 in [place] No Risk No Risk No Risk        Assessment and Plan: Patient notes that he feels mentally stable but dislike sexual side effects of invega. Provider discussed switching antipsychotic, speaking to primary care doctor about medications to help manage erectile dysfunction, or lowering dose. Provider reccommended speaking to PCP about medications to assist with ED prior to switching antipsychotics. Provider informed patient that other antipsychotics has the potential to cause sexual dysfunction. He endorsed understanding and agreed. Patient referred to community health and wellness for primary care. At this time no medication changes made. Patient agreeable to continue medications as prescribed.   1. Drug-induced erectile dysfunction  - Ambulatory referral to Internal Medicine  2. Schizophrenia spectrum disorder with psychotic disorder type not yet determined ([place])  Continue- buPROPion (WELLBUTRIN XL) 150 MG 24 hr tablet; Take 1 tablet (150 mg total) by mouth every morning.  Dispense: 30 tablet; Refill: 3 Continue- paliperidone (INVEGA SUSTENNA) 156 MG/ML SUSY injection; Inject 1 mL (156 mg total) into the muscle every 28 (twenty-eight) days.  Dispense: 1 mL; Refill: 11     Collaboration of Care: Collaboration of Care: Other provider involved in patient's care AEB PCP  Patient/Guardian was advised Release of Information must be obtained prior to any record release in order to collaborate their care with an outside provider. Patient/Guardian was advised if they have not already done so to contact the registration department to sign all necessary forms in order for [place] to release information regarding their care.   Consent: Patient/Guardian gives verbal consent for treatment and assignment of benefits for services provided during this visit. Patient/Guardian expressed understanding and agreed to proceed.     Salley Slaughter, NP 05/21/2022, 2:11 PM

## 2022-05-21 NOTE — Progress Notes (Signed)
Patient arrived for injectionpaliperidone (INVEGA SUSTENNA) injection 156 mg  Tolerated well in LEFT Arm. Pleasant as Always . NO HI/SI  NOR AH/VH ---[employer]

## 2022-06-18 ENCOUNTER — Ambulatory Visit (INDEPENDENT_AMBULATORY_CARE_PROVIDER_SITE_OTHER): Payer: No Payment, Other | Admitting: Registered Nurse

## 2022-06-18 ENCOUNTER — Ambulatory Visit (INDEPENDENT_AMBULATORY_CARE_PROVIDER_SITE_OTHER): Payer: No Payment, Other | Admitting: *Deleted

## 2022-06-18 ENCOUNTER — Encounter (HOSPITAL_COMMUNITY): Payer: Self-pay

## 2022-06-18 ENCOUNTER — Encounter (HOSPITAL_COMMUNITY): Payer: Self-pay | Admitting: Registered Nurse

## 2022-06-18 VITALS — BP 118/69 | HR 68 | Ht 73.0 in | Wt 181.0 lb

## 2022-06-18 DIAGNOSIS — F29 Unspecified psychosis not due to a substance or known physiological condition: Secondary | ICD-10-CM

## 2022-06-18 DIAGNOSIS — F250 Schizoaffective disorder, bipolar type: Secondary | ICD-10-CM

## 2022-06-18 DIAGNOSIS — G4700 Insomnia, unspecified: Secondary | ICD-10-CM | POA: Diagnosis not present

## 2022-06-18 DIAGNOSIS — F411 Generalized anxiety disorder: Secondary | ICD-10-CM | POA: Diagnosis not present

## 2022-06-18 MED ORDER — INVEGA SUSTENNA 156 MG/ML IM SUSY: 156.0000 mg | PREFILLED_SYRINGE | INTRAMUSCULAR | 11 refills | Status: DC

## 2022-06-18 MED ORDER — BUPROPION HCL ER (XL) 150 MG PO TB24: 150.0000 mg | ORAL_TABLET | ORAL | 3 refills | Status: DC

## 2022-06-18 NOTE — Progress Notes (Signed)
In for a med check and inj. He continues to have erectile dysfunction and did not follow up with the recommendation of going to community health and wellness. Given the number and address today to follow up with making a health care appt. He was seen by [facility], no changes made in medicines. He was given Invega S 156 mg in his R DELTOID. He states he doesn't remember filling out a form for patient assistance. He was given a shot today using a sample from drug company we had on hand and completed paperwork today. He states he doesn't a tax form or a copy of his paycheck per him. I will forward this paperwork to the pharmacy to submit to the drug company. He is to return in 28 days for his next shot.

## 2022-06-18 NOTE — Progress Notes (Signed)
BH MD/PA/NP OP Progress Note  06/18/2022 4:51 PM BUKHARI SERVEDIO  MRN:  [id number]  Chief Complaint:  Chief Complaint  Patient presents with   Follow-up    Psychiatric assessment and administration of long acting injectable    HPI: Larry Simmons 32 year old male seen today face to face in office for follow up psychiatric evaluation.  His psychiatric history of schizophrenia and depression. He is currently managed on Invega 156 mg monthly and Wellbutrin XL 150 mg daily.  He continues to report that medications are effective in managing his mental health but continues to have erectile dysfunction.  He reports he wasn't to continue with current medications and follow up with primary provider prior to any medication changes.  He states he has not seen PCP "I lost the phone number and address.  I need the address again."  Today he is dressed appropriate for weather.  He is calm, cooperative, and pleasant.  He engages in conversation and responses are appropriate.  He reports that depression and anxiety are managed with medications.  He denies suicidal/self-harm/homicidal ideation, psychosis, and paranoia.  He states that he is eating and sleeping.      Visit Diagnosis:    ICD-10-CM   1. Schizoaffective disorder, bipolar type ([place])  F25.0     2. Generalized anxiety disorder  F41.1     3. Insomnia, unspecified type  G47.00     4. Schizophrenia spectrum disorder with psychotic disorder type not yet determined ([facility])  F29 buPROPion (WELLBUTRIN XL) 150 MG 24 hr tablet    paliperidone (INVEGA SUSTENNA) 156 MG/ML SUSY injection      Past Psychiatric History: Schizophrenia and depression  Past Medical History:  Past Medical History:  Diagnosis Date   Asthma    Eczema          History reviewed. No pertinent surgical history.  Family Psychiatric History: Mother- Schizophrenia    Family History: History reviewed. No pertinent family history.  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  Substance and Sexual Activity   Alcohol use: Yes    Alcohol/week: 4.0 standard drinks of alcohol    Types: 4 Cans of beer per week    Comment: occassionl   Drug use: Not Currently    Types: Marijuana   Sexual activity: Not Currently  Other Topics Concern   Not on file  Social History Narrative   Not on file   Social Determinants of Health   Financial Resource Strain: Not on file  Food Insecurity: Not on file  Transportation Needs: Not on file  Physical Activity: Not on file  Stress: Not on file  Social Connections: Not on file    Allergies:  Allergies  Allergen Reactions   Iodinated Contrast Media Other (See Comments)    Irritated skin   Shellfish Allergy Swelling   Peanut-Containing Drug Products Swelling    Metabolic Disorder Labs: Lab Results  Component Value Date   HGBA1C 5.2 09/06/2021  MPG 102.54 09/06/2021   MPG 108.28 02/10/2020   Lab Results  Component Value Date   PROLACTIN 3.2 (L) 09/08/2021   PROLACTIN 4.3 02/10/2020   Lab Results  Component Value Date   CHOL 211 (H) 09/08/2021   TRIG 49 09/08/2021   HDL 102 09/08/2021   CHOLHDL 2.1 09/08/2021   VLDL 10 09/08/2021   LDLCALC 99 09/08/2021   LDLCALC 102 (H) 09/06/2021   Lab Results  Component Value Date   TSH 2.994 09/08/2021   TSH 1.974 09/06/2021    Therapeutic Level Labs: No results found for: "LITHIUM" No results found for: "VALPROATE" No results found for: "CBMZ"  Current Medications: Current Outpatient Medications  Medication  Sig Dispense Refill   buPROPion (WELLBUTRIN XL) 150 MG 24 hr tablet Take 1 tablet (150 mg total) by mouth every morning. 30 tablet 3   hydrocerin (EUCERIN) CREA Apply 1 Application topically 2 (two) times daily. (Patient not taking: Reported on 05/14/2022)  0   hydrocortisone cream 1 % Apply 1 Application topically 2 (two) times daily as needed for itching. (Patient not taking: Reported on 05/14/2022)     paliperidone (INVEGA SUSTENNA) 156 MG/ML SUSY injection Inject 1 mL (156 mg total) into the muscle every 28 (twenty-eight) days. 1 mL 11   predniSONE (DELTASONE) 20 MG tablet Take 1 tablet (20 mg total) by mouth daily with breakfast. (Patient not taking: Reported on 05/14/2022) 5 tablet 0   triamcinolone cream (KENALOG) 0.1 % Apply 1 Application topically 2 (two) times daily. (Patient not taking: Reported on 05/14/2022) 30 g 0   Current Facility-Administered Medications  Medication Dose Route Frequency Provider Last Rate Last Admin   paliperidone (INVEGA SUSTENNA) injection 156 mg  156 mg Intramuscular Q30 days Briant Cedar, MD   156 mg at 06/18/22 1456     Musculoskeletal: Strength & Muscle Tone: within normal limits Gait & Station: normal Patient leans: N/A  Psychiatric Specialty Exam: Review of Systems  Genitourinary:        Complaints of ED related to medications  Psychiatric/Behavioral:  Negative for agitation, behavioral problems, decreased concentration, dysphoric mood, hallucinations, self-injury, sleep disturbance and suicidal ideas. Nervous/anxious: Stable.   All other systems reviewed and are negative.   There were no vitals taken for this visit.There is no height or weight on file to calculate BMI.  General Appearance: Casual and Neat  Eye Contact:  Good  Speech:  Clear and Coherent and Normal Rate  Volume:  Normal  Mood:  Euthymic  Affect:  Congruent  Thought Process:  Coherent, Goal Directed, and Descriptions of Associations: Intact  Orientation:  Full (Time,  Place, and Person)  Thought Content: Logical   Suicidal Thoughts:  No  Homicidal Thoughts:  No  Memory:  Immediate;   Good Recent;   Good Remote;   Good  Judgement:  Good  Insight:  Present  Psychomotor Activity:  Normal  Concentration:  Concentration: Good and Attention Span: Good  Recall:  Good  Fund of Knowledge: Good  Language: Good  Akathisia:  No  Handed:  Right  AIMS (if indicated): done  Assets:  Communication Skills Desire for Improvement Financial Resources/Insurance Housing Intimacy Leisure Time Physical Health Social Support  ADL's:  Intact  Cognition: WNL  Sleep:  Good   Screenings: [place] Office Visit from 06/18/2022 in [facility] Admission (Discharged) from 09/09/2021 in [place] [facility]  AIMS Total Score 0 2  AUDIT    Flowsheet Row Admission (Discharged) from 09/09/2021 in [place] [facility]  Alcohol Use Disorder Identification Test Final Score (AUDIT) 4      GAD-7    Flowsheet Row Office Visit from 06/18/2022 in [facility] Office Visit from 05/21/2022 in [facility] Office Visit from 11/19/2016 in [place]  Total GAD-7 Score 1 1 2       PHQ2-9    [place] Office Visit from 06/18/2022 in [facility] Office Visit from 05/21/2022 in [place] from 02/25/2022 in [facility] Office Visit from 10/14/2021 in [place] Office Visit from 11/19/2016 in [place]  PHQ-2 Total Score 3 1 1  0 3  PHQ-9 Total Score 4 2 -- 0 4      [place] Office Visit from 06/18/2022 in [facility] ED from 04/26/2022 in [facility] Admission  (Discharged) from 09/09/2021 in [place] [facility]  C-SSRS RISK CATEGORY No Risk No Risk No Risk      Assessment and Plan: GALO BAEHR reports he continues to be mentally stable but continues to have the sexual side effect of the [place].  He is considering switching to another antipsychotic but would like to see a PCP prior to see if medication for ED will help.  Has not seen PCP related to losing address/number.  Reports he will set up appointment to see soon.  No changes in medications made at this time.     Collaboration of Care: Collaboration of Care: Medication Management AEB Medication refill/assessment and Primary Care Provider AEB Referral to [facility] to follow up on ED Meds ordered this encounter  Medications   buPROPion (WELLBUTRIN XL) 150 MG 24 hr tablet    Sig: Take 1 tablet (150 mg total) by mouth every morning.    Dispense:  30 tablet    Refill:  3    Order Specific Question:   Supervising Provider    Answer:   Hampton Abbot [[id number]]   paliperidone (INVEGA SUSTENNA) 156 MG/ML SUSY injection    Sig: Inject 1 mL (156 mg total) into the muscle every 28 (twenty-eight) days.    Dispense:  1 mL    Refill:  11    Order Specific Question:   Supervising Provider    Answer:   Hampton Abbot [id number]    Patient/Guardian was advised Release of Information must be obtained prior to any record release in order to collaborate their care with an outside provider. Patient/Guardian was advised if they have not already done so to contact the registration department to sign all necessary forms in order for [place] to release information regarding their care.   Consent: Patient/Guardian gives verbal consent for treatment and assignment of benefits for services provided during this visit. Patient/Guardian expressed understanding and agreed to proceed.    Austyn Seier, NP 06/18/2022, 4:51 PM

## 2022-07-15 ENCOUNTER — Ambulatory Visit (INDEPENDENT_AMBULATORY_CARE_PROVIDER_SITE_OTHER): Payer: Self-pay | Admitting: Primary Care

## 2022-07-16 ENCOUNTER — Ambulatory Visit (INDEPENDENT_AMBULATORY_CARE_PROVIDER_SITE_OTHER): Payer: No Payment, Other

## 2022-07-16 ENCOUNTER — Encounter (HOSPITAL_COMMUNITY): Payer: Self-pay

## 2022-07-16 VITALS — BP 105/66 | HR 79 | Ht 73.0 in | Wt 182.2 lb

## 2022-07-16 DIAGNOSIS — F250 Schizoaffective disorder, bipolar type: Secondary | ICD-10-CM | POA: Diagnosis not present

## 2022-07-16 DIAGNOSIS — F200 Paranoid schizophrenia: Secondary | ICD-10-CM

## 2022-07-16 DIAGNOSIS — N522 Drug-induced erectile dysfunction: Secondary | ICD-10-CM

## 2022-07-16 DIAGNOSIS — F29 Unspecified psychosis not due to a substance or known physiological condition: Secondary | ICD-10-CM

## 2022-07-16 NOTE — Progress Notes (Signed)
Patient arrived for injectionpaliperidone (INVEGA SUSTENNA) injection 156 mg  Tolerated well in LEFT Arm. Pleasant as Always . NO HI/SI  NOR AH/VH ---[facility] 

## 2022-08-06 ENCOUNTER — Encounter (HOSPITAL_COMMUNITY): Payer: Self-pay | Admitting: Psychiatry

## 2022-08-06 ENCOUNTER — Ambulatory Visit (INDEPENDENT_AMBULATORY_CARE_PROVIDER_SITE_OTHER): Payer: No Typology Code available for payment source | Admitting: Psychiatry

## 2022-08-06 VITALS — BP 102/65 | HR 86 | Temp 98.2°F | Wt 174.0 lb

## 2022-08-06 DIAGNOSIS — F29 Unspecified psychosis not due to a substance or known physiological condition: Secondary | ICD-10-CM

## 2022-08-06 DIAGNOSIS — Z0000 Encounter for general adult medical examination without abnormal findings: Secondary | ICD-10-CM | POA: Diagnosis not present

## 2022-08-06 DIAGNOSIS — N522 Drug-induced erectile dysfunction: Secondary | ICD-10-CM | POA: Diagnosis not present

## 2022-08-06 MED ORDER — PALIPERIDONE ER 6 MG PO TB24: 6.0000 mg | ORAL_TABLET | Freq: Every day | ORAL | 3 refills | Status: DC

## 2022-08-06 MED ORDER — BUPROPION HCL ER (XL) 150 MG PO TB24: 150.0000 mg | ORAL_TABLET | ORAL | 3 refills | Status: DC

## 2022-08-06 NOTE — Progress Notes (Signed)
BH MD/PA/NP OP Progress Note  08/06/2022 12:34 PM Larry Simmons  MRN:  [id number]  Chief Complaint: "I no longer want invega injections"  HPI: 32 year old male seen today for follow up psychiatric history of schizophrenia and depression. He is currently managed on Invega  156 mg monthly and Wellbutrin XL 150 mg daily. He notes his medications are effective in managing his mental health but notes that he wants to discontinue [place].   Today patient is well groomed, pleasant, cooperative, and engaged in concretion. He notes that he dislikes the sexual side effects of Invega and reports that he wants to discontinue it. Patient reports that he is unable to have/ maintain an erection.  Provider asked patient if he had followed up with his primary care doctor and he said he has not.  He reports that he cannot remember where he once went for primary care.  Provider referred patient to community health and wellness.  Patient however reports that he does not wish to get his Invega injection again.  Patient was last hospitalized a year ago for paranoia and delusions.  Provider informed patient that completely discontinuing Invega might lead to psychosis, paranoia, or delusions.  Provider asked patient if he would be willing to take oral Invega or another antipsychotic form. He notes that he would try oral invega.    Patient informed Clinical research associate that his anxiety and depression are well-managed.  Today provider conducted GAD-7 patient's with 0, at his last visit he scored a 1.  Provider also conducted PHQ-9 patient scored a 0, at his last visit he scored a 2. He endorses adequate sleep and appetite. Today denies SI/HI/VAH, mania, or paranoia.  Today provider conducted an AIMS assessment and patient scored a 0.   Patient continues to work as a Naval architect. He notes that he finds enjoyment in her job.   Today Invega 156 mg monthly discontinued.  Patient will start Invega 6 mg daily.  He will follow-up  with community health and wellness for primary care.  He will continue Wellbutrin as prescribed.Potential side effects of medication and risks vs benefits of treatment vs non-treatment were explained and discussed. All questions were answered.  No other concerns at this time. Visit Diagnosis:    ICD-10-CM   1. Drug-induced erectile dysfunction  N52.2 Ambulatory referral to Internal Medicine    2. Schizophrenia spectrum disorder with psychotic disorder type not yet determined (HCC)  F29 buPROPion (WELLBUTRIN XL) 150 MG 24 hr tablet    paliperidone (INVEGA) 6 MG 24 hr tablet    3. Well adult exam  Z00.00 Ambulatory referral to Internal Medicine       Past Psychiatric History:  Schizophrenia and depression  Past Medical History:  Past Medical History:  Diagnosis Date   Asthma    Eczema    No past surgical history on file.  Family Psychiatric History: Mother- Schizophrenia   Family History: No family history on file.  Social History:  Social History   Socioeconomic History   Marital status: Single    Spouse name: Not on file   Number of children: 0   Years of education: Not on file   Highest education level: High school graduate  Occupational History   Occupation: Social research officer, government     Comment: Museum/gallery curator  Tobacco Use   Smoking status: Every Day    Packs/day: .25    Types: Cigarettes   Smokeless tobacco: Never   Tobacco comments:    Interested in nicorette  gum, will be ordered.  Substance and Sexual Activity   Alcohol use: Yes    Alcohol/week: 4.0 standard drinks of alcohol    Types: 4 Cans of beer per week    Comment: occassionl   Drug use: Not Currently    Types: Marijuana   Sexual activity: Not Currently  Other Topics Concern   Not on file  Social History Narrative   Not on file   Social Determinants of Health   Financial Resource Strain: Not on file  Food Insecurity: Not on file  Transportation Needs: Not on file  Physical Activity: Not on file   Stress: Not on file  Social Connections: Not on file    Allergies:  Allergies  Allergen Reactions   Iodinated Contrast Media Other (See Comments)    Irritated skin   Shellfish Allergy Swelling   Peanut-Containing Drug Products Swelling    Metabolic Disorder Labs: Lab Results  Component Value Date   HGBA1C 5.2 09/06/2021   MPG 102.54 09/06/2021   MPG 108.28 02/10/2020   Lab Results  Component Value Date   PROLACTIN 3.2 (L) 09/08/2021   PROLACTIN 4.3 02/10/2020   Lab Results  Component Value Date   CHOL 211 (H) 09/08/2021   TRIG 49 09/08/2021   HDL 102 09/08/2021   CHOLHDL 2.1 09/08/2021   VLDL 10 09/08/2021   LDLCALC 99 09/08/2021   LDLCALC 102 (H) 09/06/2021   Lab Results  Component Value Date   TSH 2.994 09/08/2021   TSH 1.974 09/06/2021    Therapeutic Level Labs: No results found for: "LITHIUM" No results found for: "VALPROATE" No results found for: "CBMZ"  Current Medications: Current Outpatient Medications  Medication Sig Dispense Refill   paliperidone (INVEGA) 6 MG 24 hr tablet Take 1 tablet (6 mg total) by mouth daily. 30 tablet 3   buPROPion (WELLBUTRIN XL) 150 MG 24 hr tablet Take 1 tablet (150 mg total) by mouth every morning. 30 tablet 3   hydrocerin (EUCERIN) CREA Apply 1 Application topically 2 (two) times daily. (Patient not taking: Reported on 05/14/2022)  0   hydrocortisone cream 1 % Apply 1 Application topically 2 (two) times daily as needed for itching. (Patient not taking: Reported on 05/14/2022)     predniSONE (DELTASONE) 20 MG tablet Take 1 tablet (20 mg total) by mouth daily with breakfast. (Patient not taking: Reported on 05/14/2022) 5 tablet 0   triamcinolone cream (KENALOG) 0.1 % Apply 1 Application topically 2 (two) times daily. (Patient not taking: Reported on 05/14/2022) 30 g 0   No current facility-administered medications for this visit.     Musculoskeletal: Strength & Muscle Tone: within normal limits Gait & Station:  normal Patient leans: N/A  Psychiatric Specialty Exam: Review of Systems  There were no vitals taken for this visit.There is no height or weight on file to calculate BMI.  General Appearance: Well Groomed  Eye Contact:  Good  Speech:  Clear and Coherent and Normal Rate  Volume:  Normal  Mood:  Euthymic  Affect:  Appropriate and Congruent  Thought Process:  Coherent, Goal Directed, and Linear  Orientation:  Full (Time, Place, and Person)  Thought Content: WDL and Logical   Suicidal Thoughts:  No  Homicidal Thoughts:  No  Memory:  Immediate;   Good Recent;   Good Remote;   Good  Judgement:  Good  Insight:  Good  Psychomotor Activity:  Normal  Concentration:  Concentration: Good and Attention Span: Good  Recall:  Good  Fund of Knowledge:  Good  Language: Good  Akathisia:  No  Handed:  Right  AIMS (if indicated): not done  Assets:  Communication Skills Desire for Improvement Financial Resources/Insurance Housing Intimacy Leisure Time Physical Health Social Support  ADL's:  Intact  Cognition: WNL  Sleep:  Good   Screenings: AIMS    Flowsheet Row Office Visit from [facility]18/2022 in [facility]  AIMS Total Score 0 2      AUDIT    Flowsheet Row Ad[facility] (Discharged[facility]  Alcohol Use Disorder Identification Test Final Score (AUDIT) 4      GAD-7    Flowsheet Row Office Visit from [facility]/18/2022 in [facility] Office Visit from 05/21/2022 in [facility] Office Visit from 11/19/2016 in [facility]  Total GAD-7 Score 1 1 2       PHQ2-9    Flowsheet Row Office Visit from 06/18/2022 in [facility] Office Visit from 05/21/2022 in [facility] Clinical Support  from 02/25/2022 in [facility] Office Visit from 10/14/2021 in [facility] Office Visit from 11/19/2016 in [facility]  PHQ-2 Total Score 3 1 1  0 3  PHQ-9 Total Score 4 2 -- 0 4      Flowsheet Row Office Visit from 06/18/2022 in [facility] ED from [facility]/26/2022 in [facility] Admission (Discharged[facility]  C-SSRS RISK CATEGORY No Risk No Risk No Risk        Assessment and Plan: Patient notes that he feels mentally stable but dislike sexual side effects of invega. Today Invega 156 mg monthly discontinued.  Patient will start Invega 6 mg daily.  He will follow-up with community health and wellness for primary care.  He will continue Wellbutrin as prescribed.   1. Schizophrenia spectrum disorder with psychotic disorder type not yet determined (HCC)  Continue- buPROPion (WELLBUTRIN XL) 150 MG 24 hr tablet; Take 1 tablet (150 mg total) by mouth every morning.  Dispense: 30 tablet; Refill: 3 Start- paliperidone (INVEGA) 6 MG 24 hr tablet; Take 1 tablet (6 mg total) by mouth daily.  Dispense: 30 tablet; Refill: 3  2. Drug-induced erectile dysfunction  - Ambulatory referral to Internal Medicine  3. Well adult exam  - Ambulatory referral to Internal Medicine   Collaboration of Care: Collaboration of Care: Other provider involved in patient's care AEB PCP  Patient/Guardian was advised Release of Information must be obtained prior to any record release in order to collaborate their care with an outside provider. Patient/Guardian was advised if they have not already done so to contact the registration department to sign all necessary forms in order for [place] to release information regarding their care.   Consent: Patient/Guardian gives verbal consent for treatment and assignment of benefits for  services provided during this visit. Patient/Guardian expressed understanding and agreed to proceed.    Shanna Cisco, NP 08/06/2022, 12:34 PM

## 2022-08-13 ENCOUNTER — Ambulatory Visit (HOSPITAL_COMMUNITY): Payer: No Payment, Other

## 2022-10-20 ENCOUNTER — Encounter (HOSPITAL_COMMUNITY): Payer: Self-pay | Admitting: Psychiatry

## 2022-10-20 ENCOUNTER — Ambulatory Visit (INDEPENDENT_AMBULATORY_CARE_PROVIDER_SITE_OTHER): Payer: 59 | Admitting: Psychiatry

## 2022-10-20 DIAGNOSIS — F29 Unspecified psychosis not due to a substance or known physiological condition: Secondary | ICD-10-CM | POA: Diagnosis not present

## 2022-10-20 MED ORDER — PALIPERIDONE ER 3 MG PO TB24: 3.0000 mg | ORAL_TABLET | Freq: Every day | ORAL | 3 refills | Status: DC

## 2022-10-20 MED ORDER — BUPROPION HCL ER (XL) 150 MG PO TB24
150.0000 mg | ORAL_TABLET | ORAL | 3 refills | Status: DC
Start: 2022-10-20 — End: 2022-12-08
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
## 2022-11-03 ENCOUNTER — Telehealth (HOSPITAL_COMMUNITY): Payer: Self-pay | Admitting: Psychiatry

## 2022-11-03 NOTE — Telephone Encounter (Signed)
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 Provider documented that patient is prescribed Wellbutrin and Invega. Patient notes that he has not taken Invega in over 4 months and has not experienced psychosis. Patient sister too reports that he is doing well. She denies him having bizarre behaviors and notes that he is doing well on Wellbutrin. Provider filled out form and faxed it for patient. No other concerns noted at this time.

## 2022-11-19 ENCOUNTER — Ambulatory Visit (HOSPITAL_COMMUNITY): Payer: MEDICAID

## 2022-11-20 ENCOUNTER — Telehealth (HOSPITAL_COMMUNITY): Payer: Self-pay

## 2022-11-20 NOTE — Telephone Encounter (Signed)
Medication management - Call with patient's sister and emergency contact stating patient had just started a new job and missed appointment 11/19/22 but needs a new Invega 3 mg order. Agreed to send request to Dr. Doyne Keel but informed collateral it may be Tuesday of the coming week before collateral sees the request due to staff being off this afternoon and closed for the Labor Day holiday on 11/23/22.  Collateral stated understanding and also informed of [facility] if any crisis prior to then. Collateral agreed to have patient call back to reschedule next appointment.

## 2022-11-24 ENCOUNTER — Other Ambulatory Visit (HOSPITAL_COMMUNITY): Payer: Self-pay | Admitting: Psychiatry

## 2022-11-24 DIAGNOSIS — F29 Unspecified psychosis not due to a substance or known physiological condition: Secondary | ICD-10-CM

## 2022-11-24 MED ORDER — PALIPERIDONE ER 3 MG PO TB24
3.0000 mg | ORAL_TABLET | Freq: Every day | ORAL | 3 refills | Status: DC
Start: 2022-11-24 — End: 2022-12-08

## 2022-11-24 NOTE — Telephone Encounter (Signed)
Request received from covermymeds for a pa to be done on patients generic invega. Submitted and waiting on the decision

## 2022-11-24 NOTE — Telephone Encounter (Signed)
Medication sent to preferred pharmacy

## 2022-12-08 ENCOUNTER — Other Ambulatory Visit: Payer: Self-pay

## 2022-12-08 ENCOUNTER — Ambulatory Visit (INDEPENDENT_AMBULATORY_CARE_PROVIDER_SITE_OTHER): Payer: MEDICAID | Admitting: Psychiatry

## 2022-12-08 ENCOUNTER — Ambulatory Visit: Payer: MEDICAID | Admitting: Family Medicine

## 2022-12-08 ENCOUNTER — Encounter: Payer: Self-pay | Admitting: Family Medicine

## 2022-12-08 ENCOUNTER — Encounter (HOSPITAL_COMMUNITY): Payer: Self-pay | Admitting: Psychiatry

## 2022-12-08 DIAGNOSIS — F29 Unspecified psychosis not due to a substance or known physiological condition: Secondary | ICD-10-CM

## 2022-12-08 DIAGNOSIS — Z113 Encounter for screening for infections with a predominantly sexual mode of transmission: Secondary | ICD-10-CM | POA: Diagnosis not present

## 2022-12-08 LAB — HM HIV SCREENING LAB: HM HIV Screening: NEGATIVE

## 2022-12-08 MED ORDER — BUPROPION HCL ER (XL) 150 MG PO TB24
150.0000 mg | ORAL_TABLET | ORAL | 3 refills | Status: DC
Start: 2022-12-08 — End: 2023-01-20

## 2022-12-08 MED ORDER — PALIPERIDONE ER 3 MG PO TB24
3.0000 mg | ORAL_TABLET | Freq: Every day | ORAL | 3 refills | Status: DC
Start: 2022-12-08 — End: 2023-01-19

## 2022-12-08 NOTE — Progress Notes (Signed)
BH MD/PA/NP OP Progress Note  12/08/2022 3:57 PM Larry LEDER  MRN:  [id number]  Chief Complaint: "I restarted my medications"  HPI: 32 year old male seen today for follow up psychiatric history of marijuana use,schizophrenia and depression. He is currently managed on Invega  3 mg daily and Wellbutrin XL 150 mg daily. He notes his medications are effective in managing his psychiatric conditions.     Today patient is well groomed, pleasant, cooperative, and engaged in concretion. He notes that he restarted his medications and is doing well.  Recently he informed Clinical research associate that he started working for [employer].  He notes that he traveles 5 days a week to varying states.  Patient notes that he travels mostly to [place] with another trucking peer.  Since his last visit he notes that he has been reexamining his relationship with his ex fiance (Aleah).  He notes that his mental health led to their break-up.  He notes that he wants her back as he finds that she completes him and makes him a better man.  He informed Clinical research associate that in the past he allowed his family members and her family members to get in between the relationship but now wants a new beginning and is fearful that she will not reciprocate his feelings.    Patient notes that his mood is stable and informed writer that he has minimal anxiety and depression.   Today provider conducted a PHQ-9 and patient scored a 0, at his last visit he scored a 4.  Provider also conducted a GAD 7 and patient scored a 1, at his last visit he scored a 0.  He endorses sleeping 5 to 6 hours nightly.  Since his last visit he informed writer that he has gained 10 pounds.  Today he denies SI/HI/VH, mania, paranoia.  Patient informed Clinical research associate that he continues to stay away from marijuana as he believes that this caused his psychotic episode a year ago.  He does note that he drinks alcohol socially.  Patient informed Clinical research associate that his family has suggested that  he restart Invega LAI.  Patient however notes that he has been compliant with his oral Invega and would like to continue this as prescribed.  No medication changes made today.  Patient agreeable to continue medications as prescribed.  No other concerns at this time.   Visit Diagnosis:    ICD-10-CM   1. Schizophrenia spectrum disorder with psychotic disorder type not yet determined (HCC)  F29 buPROPion (WELLBUTRIN XL) 150 MG 24 hr tablet    paliperidone (INVEGA) 3 MG 24 hr tablet         Past Psychiatric History:  Schizophrenia and depression  Past Medical History:  Past Medical History:  Diagnosis Date   Asthma    Eczema    No past surgical history on file.  Family Psychiatric History: Mother- Schizophrenia   Family History: No family history on file.  Social History:  Social History   Socioeconomic History   Marital status: Single    Spouse name: Not on file   Number of children: 0   Years of education: Not on file   Highest education level: High school graduate  Occupational History   Occupation: Social research officer, government     Comment: Museum/gallery curator  Tobacco Use   Smoking status: Every Day    Current packs/day: 0.50    Average packs/day: 0.5 packs/day for 12.8 years (6.4 ttl pk-yrs)    Types: Cigarettes    Start date:  03/02/2010   Smokeless tobacco: Never   Tobacco comments:    Patient currently using nicotene gum/patches to help quit.   Substance and Sexual Activity   Alcohol use: Yes    Alcohol/week: 4.0 standard drinks of alcohol    Types: 4 Cans of beer per week    Comment: occassionl   Drug use: Not Currently    Types: Marijuana    Comment: Last used 04/2022   Sexual activity: Not Currently    Comment: Last sexual encounter 04/2022  Other Topics Concern   Not on file  Social History Narrative   Not on file   Social Determinants of Health   Financial Resource Strain: Not on file  Food Insecurity: Not on file  Transportation Needs: Not on file  Physical  Activity: Not on file  Stress: Not on file  Social Connections: Not on file    Allergies:  Allergies  Allergen Reactions   Iodinated Contrast Media Other (See Comments)    Irritated skin   Shellfish Allergy Swelling   Peanut-Containing Drug Products Swelling    Metabolic Disorder Labs: Lab Results  Component Value Date   HGBA1C 5.2 09/06/2021   MPG 102.54 09/06/2021   MPG 108.28 02/10/2020   Lab Results  Component Value Date   PROLACTIN 3.2 (L) 09/08/2021   PROLACTIN 4.3 02/10/2020   Lab Results  Component Value Date   CHOL 211 (H) 09/08/2021   TRIG 49 09/08/2021   HDL 102 09/08/2021   CHOLHDL 2.1 09/08/2021   VLDL 10 09/08/2021   LDLCALC 99 09/08/2021   LDLCALC 102 (H) 09/06/2021   Lab Results  Component Value Date   TSH 2.994 09/08/2021   TSH 1.974 09/06/2021    Therapeutic Level Labs: No results found for: "LITHIUM" No results found for: "VALPROATE" No results found for: "CBMZ"  Current Medications: Current Outpatient Medications  Medication Sig Dispense Refill   buPROPion (WELLBUTRIN XL) 150 MG 24 hr tablet Take 1 tablet (150 mg total) by mouth every morning. 30 tablet 3   hydrocerin (EUCERIN) CREA Apply 1 Application topically 2 (two) times daily. (Patient not taking: Reported on 05/14/2022)  0   hydrocortisone cream 1 % Apply 1 Application topically 2 (two) times daily as needed for itching. (Patient not taking: Reported on 05/14/2022)     paliperidone (INVEGA) 3 MG 24 hr tablet Take 1 tablet (3 mg total) by mouth daily. 30 tablet 3   predniSONE (DELTASONE) 20 MG tablet Take 1 tablet (20 mg total) by mouth daily with breakfast. (Patient not taking: Reported on 05/14/2022) 5 tablet 0   triamcinolone cream (KENALOG) 0.1 % Apply 1 Application topically 2 (two) times daily. (Patient not taking: Reported on 05/14/2022) 30 g 0   No current facility-administered medications for this visit.     Musculoskeletal: Strength & Muscle Tone: within normal limits Gait  & Station: normal Patient leans: N/A  Psychiatric Specialty Exam: Review of Systems  Blood pressure 113/74, pulse (!) 108, temperature 97.7 F (36.5 C), height 6\' 1"  (1.854 m), weight 194 lb 9.6 oz (88.3 kg), SpO2 99%.Body mass index is 25.67 kg/m.  General Appearance: Well Groomed  Eye Contact:  Good  Speech:  Clear and Coherent and Normal Rate  Volume:  Normal  Mood:  Euthymic  Affect:  Appropriate and Congruent  Thought Process:  Coherent, Goal Directed, and Linear  Orientation:  Full (Time, Place, and Person)  Thought Content: WDL and Logical   Suicidal Thoughts:  No  Homicidal Thoughts:  No  Memory:  Immediate;   Good Recent;   Good Remote;   Good  Judgement:  Good  Insight:  Good  Psychomotor Activity:  Normal  Concentration:  Concentration: Good and Attention Span: Good  Recall:  Good  Fund of Knowledge: Good  Language: Good  Akathisia:  No  Handed:  Right  AIMS (if indicated): not done  Assets:  Communication Skills Desire for Improvement Financial Resources/Insurance Housing Intimacy Leisure Time Physical Health Social Support  ADL's:  Intact  Cognition: WNL  Sleep:  Good   Screenings: AIMS    Flowsheet Row Office Visit from 10/20/2022 in [facility] Office Visit from 06/18/2022 in [facility]  AIMS Total Score 1 0 2      AUDIT    Flowsheet Row Ad[facility] (Discharged[facility]  Alcohol Use Disorder Identification Test Final Score (AUDIT) 4      GAD-7    Flowsheet Row Clinical Support from 12/08/2022 in [facility] Office Visit from 10/20/2022 in [facility] Office Visit from 06/18/2022 in [facility] Office Visit from 05/21/2022 in [facility]  Office Visit from 11/19/2016 in [facility]  Total GAD-7 Score 1 5 1 1 2       PHQ2-9    Flowsheet Row Clinical Support from 12/08/2022 in [facility] Office Visit from 10/20/2022 in [facility] Office Visit from 06/18/2022 in [facility] Office Visit from 05/21/2022 in [facility] Clinical Support from 02/25/2022 in [facility]  PHQ-2 Total Score 1 2 3 1 1   PHQ-9 Total Score 2 4 4 2  --      Flowsheet Row Office Visit from 06/18/2022 in [facility] from 04/26/2022 in [facility] Admission (Discharged) [facility]  C-SSRS RISK CATEGORY No Risk No Risk No Risk        Assessment and Plan: Patient notes that he feels mentally stable and has minimal anxiety and depression. Patient informed Clinical research associate that his family has suggested that he restart Invega LAI.  Patient however notes that he has been compliant with his oral Invega and would like to continue this as prescribed.  No medication changes made today.  Patient agreeable to continue medications as prescribed.    1. Schizophrenia spectrum disorder with psychotic disorder type not yet determined (HCC)  Continue- buPROPion (WELLBUTRIN XL) 150 MG 24 hr tablet; Take 1 tablet Continue- paliperidone (INVEGA) 3 MG 24 hr tablet; Take 1 tablet (3 mg total) by mouth daily.  Dispense: 30 tablet; Refill: 3    Collaboration of Care: Collaboration of Care: Other provider involved in patient's care AEB PCP  Patient/Guardian was advised Release of Information must be obtained prior to any record release in order to collaborate their care with an outside provider. Patient/Guardian was advised if they have not already done so to contact the registration department to sign all  necessary forms in order for [place] to release information regarding their care.   Consent: Patient/Guardian gives verbal consent for treatment and assignment of benefits for services provided during this visit. Patient/Guardian expressed understanding and agreed to proceed.   Follow up in 1 month Anastacia Reinecke Norlene Duel,  NP 12/08/2022, 3:57 PM

## 2022-12-08 NOTE — Progress Notes (Signed)
[facility] Department STI clinic/screening visit  Subjective:  Larry Simmons is a 32 y.o. male being seen today for an STI screening visit. The patient reports they do not have symptoms.    Patient has the following medical conditions:   Patient Active Problem List   Diagnosis Date Noted   Drug-induced erectile dysfunction 08/06/2022   Well adult exam 08/06/2022   Asthma    Eczema    Schizophrenia spectrum disorder with psychotic disorder type not yet determined [facility]) 09/11/2021     Chief Complaint  Patient presents with   SEXUALLY TRANSMITTED DISEASE    Routine check, no symptoms    HPI  Patient reports no symptoms or concerns today and wishes to have screening done before entering a new relationship.  Last HIV test per patient/review of record was No results found for: "HMHIVSCREEN"  Lab Results  Component Value Date   HIV Non Reactive 09/11/2021    Does the patient or their partner desires a pregnancy in the next year? No- Patient does not currently have a partner.   Screening for MPX risk: Does the patient have an unexplained rash? No Is the patient MSM? No Does the patient endorse multiple sex partners or anonymous sex partners? No Did the patient have close or sexual contact with a person diagnosed with MPX? No Has the patient traveled outside the [place] where MPX is endemic? No Is there a high clinical suspicion for MPX-- evidenced by one of the following No  -Unlikely to be chickenpox  -Lymphadenopathy  -Rash that present in same phase of evolution on any given body part   See flowsheet for further details and programmatic requirements.   Immunization History  Administered Date(s) Administered   PFIZER(Purple Top)SARS-COV-2 Vaccination 07/01/2019, 07/24/2019   Tdap 08/10/2021     The following portions of the patient's history were reviewed and updated as appropriate: allergies, current medications, past medical history, past social history,  past surgical history and problem list.  Objective:  There were no vitals filed for this visit.  Physical Exam Nursing note reviewed.  Constitutional:      Appearance: Normal appearance.  HENT:     Head: Normocephalic and atraumatic.     Salivary Glands: Right salivary gland is not diffusely enlarged or tender. Left salivary gland is not diffusely enlarged or tender.     Comments: No nits or hair loss    Mouth/Throat:     Lips: Pink.     Mouth: Mucous membranes are moist. No oral lesions.     Tongue: No lesions. Tongue does not deviate from midline.     Pharynx: Oropharynx is clear. Uvula midline. No oropharyngeal exudate or posterior oropharyngeal erythema.     Tonsils: No tonsillar exudate.  Eyes:     General:        Right eye: No discharge.        Left eye: No discharge.     Conjunctiva/sclera:     Right eye: Right conjunctiva is not injected. No exudate.    Left eye: Left conjunctiva is not injected. No exudate. Pulmonary:     Effort: Pulmonary effort is normal.  Genitourinary:    Pubic Area: Pubic lice: no nits.     Rectum: Tenderness: no lesions or discharge.     Comments: Patient deferred genital exam today as he is asymptomatic.  Penile Discharge=N/A Amount: N/A Color:  N/A Lymphadenopathy:     Head:     Right side of head: No submental, submandibular,  tonsillar, preauricular or posterior auricular adenopathy.     Left side of head: No submental, submandibular, tonsillar, preauricular or posterior auricular adenopathy.     Cervical: No cervical adenopathy.     Right cervical: No superficial or posterior cervical adenopathy.    Left cervical: No superficial or posterior cervical adenopathy.     Upper Body:     Right upper body: No supraclavicular adenopathy.     Left upper body: No supraclavicular adenopathy.  Skin:    General: Skin is warm and dry.     Findings: No lesion or rash.          Comments: Pt has vitiligo on backs of hands and lower back.    Neurological:     Mental Status: He is alert and oriented to person, place, and time.  Psychiatric:        Attention and Perception: Attention normal.        Mood and Affect: Mood normal.        Speech: Speech normal.        Behavior: Behavior normal. Behavior is cooperative.        Thought Content: Thought content normal.       Assessment and Plan:  Larry Simmons is a 32 y.o. male presenting to the [facility] Department for STI screening  1. Screening for venereal disease  - Gonococcus culture - HIV [place] [facility] - Chlamydia/GC NAA, Confirmation - Syphilis Serology, [place] [facility]   Patient does not have STI symptoms Patient accepted all screenings including  urine GC/Chlamydia, and blood work for HIV/Syphilis. Patient meets criteria for HepB screening? No. Ordered? not applicable Patient meets criteria for HepC screening? Yes. Ordered? no Recommended condom use with all sex Discussed importance of condom use for STI prevent  Treat positive test results per standing order. Discussed time line for [facility] results and that patient will be called with positive results and encouraged patient to call if he had not heard in 2 weeks Recommended repeat testing in 3 months with positive results. Recommended returning for continued or worsening symptoms.   Return if symptoms worsen or fail to improve.  Future Appointments  Date Time Provider Department Center  12/08/2022  3:00 PM Shanna Cisco, NP [facility] None   Total time spent 30 minutes.   Edmonia James, NP

## 2022-12-08 NOTE — Progress Notes (Signed)

## 2022-12-08 NOTE — Progress Notes (Signed)
Pt is here for STD screening, condoms given. Gaspar Garbe, RN

## 2022-12-13 LAB — GONOCOCCUS CULTURE

## 2022-12-13 LAB — CHLAMYDIA/GC NAA, CONFIRMATION
Chlamydia trachomatis, NAA: NEGATIVE
Neisseria gonorrhoeae, NAA: NEGATIVE

## 2022-12-15 ENCOUNTER — Telehealth (HOSPITAL_COMMUNITY): Payer: Self-pay | Admitting: *Deleted

## 2022-12-15 NOTE — Telephone Encounter (Signed)
Fax received for prior authorization of Paliperidone 3mg . Submitted online with cover my meds. Awaiting decision.

## 2022-12-16 ENCOUNTER — Telehealth (HOSPITAL_COMMUNITY): Payer: Self-pay | Admitting: *Deleted

## 2022-12-16 NOTE — Telephone Encounter (Signed)
Fax received for approval of Paliperidone 3mg  until 12/16/23. [id number]. Notified pharmacy of approval.

## 2023-01-19 ENCOUNTER — Telehealth (HOSPITAL_COMMUNITY): Payer: Self-pay | Admitting: *Deleted

## 2023-01-19 ENCOUNTER — Other Ambulatory Visit (HOSPITAL_COMMUNITY): Payer: Self-pay | Admitting: Psychiatry

## 2023-01-19 DIAGNOSIS — F29 Unspecified psychosis not due to a substance or known physiological condition: Secondary | ICD-10-CM

## 2023-01-19 MED ORDER — PALIPERIDONE ER 3 MG PO TB24
3.0000 mg | ORAL_TABLET | Freq: Every day | ORAL | 0 refills | Status: DC
Start: 2023-01-19 — End: 2023-02-16

## 2023-01-19 MED ORDER — PALIPERIDONE PALMITATE ER 234 MG/1.5ML IM SUSY
234.0000 mg | PREFILLED_SYRINGE | Freq: Once | INTRAMUSCULAR | Status: DC
Start: 2023-01-19 — End: 2023-02-16

## 2023-01-19 MED ORDER — INVEGA SUSTENNA 234 MG/1.5ML IM SUSY: 234.0000 mg | PREFILLED_SYRINGE | Freq: Once | INTRAMUSCULAR | 0 refills | Status: DC

## 2023-01-19 NOTE — Telephone Encounter (Signed)
Patient informed Clinical research associate that he has been seeing spirits.  He also notes that he is posting inappropriate things on social media.  Patient family is concerned about his wellbeing.  Patient father notes that he would like him to be remedicated.  Provider asked patient if he would take oral Invega 3 mg for a 7 days. He was agreeable to this.  Patient will return on November 5th to receive Invega Sustenna 234 mg injection.  He will follow up 7 days after this to receive a maintenance dose of Invega 156 mg monthly.Potential side effects of medication and risks vs benefits of treatment vs non-treatment were explained and discussed.  Provider informed patient that he will need to restart his invega regimen over Invega as she is uncertain if his sister appropriately gave him the medication.  No other concerns at this time.

## 2023-01-19 NOTE — Telephone Encounter (Signed)
Patients sister called asking if she can get his medication from his pharmacy and administer it herself.  Sister wants to bring him in hopes of him seeing a psychiatrist during his injection since he has not been doing well. Sister gave his last injection at the end of August that she obtained from his pharmacy. Patient is staying with her father in his utility shed until 1am, talking about exposing his ex-girlfriend during a live social media stream, posting about wanting to go to [place] to pray and that God is telling him to do things. Family is worried about him. Sister is asking for a return call today after 2:10pm since she is a Midwife.

## 2023-01-19 NOTE — Addendum Note (Signed)
Addended by: Heywood Bene on: 01/19/2023 10:08 AM   Modules accepted: Orders

## 2023-01-20 ENCOUNTER — Telehealth (INDEPENDENT_AMBULATORY_CARE_PROVIDER_SITE_OTHER): Payer: 59 | Admitting: Psychiatry

## 2023-01-20 ENCOUNTER — Emergency Department
Admission: EM | Admit: 2023-01-20 | Discharge: 2023-01-22 | Disposition: A | Discharge status: Other Acute Inpatient Hospital | Payer: MEDICAID | Attending: Emergency Medicine | Admitting: Emergency Medicine

## 2023-01-20 ENCOUNTER — Other Ambulatory Visit: Payer: Self-pay

## 2023-01-20 ENCOUNTER — Encounter (HOSPITAL_COMMUNITY): Payer: Self-pay | Admitting: Psychiatry

## 2023-01-20 ENCOUNTER — Telehealth (HOSPITAL_COMMUNITY): Payer: Self-pay

## 2023-01-20 DIAGNOSIS — F17210 Nicotine dependence, cigarettes, uncomplicated: Secondary | ICD-10-CM | POA: Diagnosis not present

## 2023-01-20 DIAGNOSIS — F29 Unspecified psychosis not due to a substance or known physiological condition: Secondary | ICD-10-CM | POA: Insufficient documentation

## 2023-01-20 DIAGNOSIS — F250 Schizoaffective disorder, bipolar type: Secondary | ICD-10-CM

## 2023-01-20 DIAGNOSIS — F28 Other psychotic disorder not due to a substance or known physiological condition: Secondary | ICD-10-CM | POA: Insufficient documentation

## 2023-01-20 DIAGNOSIS — G479 Sleep disorder, unspecified: Secondary | ICD-10-CM

## 2023-01-20 DIAGNOSIS — F22 Delusional disorders: Secondary | ICD-10-CM | POA: Diagnosis not present

## 2023-01-20 DIAGNOSIS — J45909 Unspecified asthma, uncomplicated: Secondary | ICD-10-CM | POA: Insufficient documentation

## 2023-01-20 MED ORDER — BUPROPION HCL ER (XL) 150 MG PO TB24: 150.0000 mg | ORAL_TABLET | ORAL | 3 refills | Status: DC

## 2023-01-20 MED ORDER — TRAZODONE HCL 50 MG PO TABS: 50.0000 mg | ORAL_TABLET | Freq: Every day | ORAL | 3 refills | Status: DC

## 2023-01-20 MED ORDER — INVEGA SUSTENNA 234 MG/1.5ML IM SUSY: 234.0000 mg | PREFILLED_SYRINGE | Freq: Once | INTRAMUSCULAR | 0 refills | Status: DC

## 2023-01-20 NOTE — ED Triage Notes (Signed)
Pt presents to ER with sister with c/o mental health eval.  Pts sister states that pt has been dealing with some irrational thoughts.  Pt's sister also reporting that pt took his fathers car and drove off without telling anyone.  When asked what pt is feeling, pt states "only God can judge me" and "my spirits been talking to me."  Pt denies SI, but is having some thoughts of harming his ex-fiance.  Pt is otherwise alert, and cooperative in triage.  NAD noted.    Pt does take an invega injection q28 days.  Has not had it in appx 2 months.

## 2023-01-20 NOTE — Telephone Encounter (Signed)
Thanks for the update

## 2023-01-20 NOTE — ED Provider Notes (Signed)
[facility] Provider Note    Event Date/Time   First MD Initiated Contact with Patient 01/20/23 2151     (approximate)   History   Mental Health Problem   HPI Larry Simmons is a 32 y.o. male with history of depression and paranoia presenting today for worsening paranoia.  Patient states he feels like he is having a religious experience and being talked to by God.  He states that he has been told that he needs to hurt his ex-girlfriend.  Denies any SI or self-harm.  Notes tobacco use but otherwise denies alcohol, marijuana, or other recreational drugs.  Reportedly supposed to be on medications for his depression and paranoia but has not taken them recently.  Reviewed chart notes from behavioral providers including today.     Physical Exam   Triage Vital Signs: ED Triage Vitals  Encounter Vitals Group     BP 01/20/23 2123 (!) 142/90     Systolic BP Percentile --      Diastolic BP Percentile --      Pulse Rate 01/20/23 2123 (!) 129     Resp 01/20/23 2123 20     Temp 01/20/23 2123 98.3 F (36.8 C)     Temp Source 01/20/23 2123 Oral     SpO2 01/20/23 2123 95 %     Weight 01/20/23 2128 194 lb (88 kg)     Height 01/20/23 2128 6\' 1"  (1.854 m)     Head Circumference --      Peak Flow --      Pain Score 01/20/23 2128 0     Pain Loc --      Pain Education --      Exclude from Growth Chart --     Most recent vital signs: Vitals:   01/20/23 2123  BP: (!) 142/90  Pulse: (!) 129  Resp: 20  Temp: 98.3 F (36.8 C)  SpO2: 95%   I have reviewed the vital signs. General:  Awake, alert, no acute distress. Head:  Normocephalic, Atraumatic. EENT:  PERRL, EOMI, Oral mucosa pink and moist, Neck is supple. Cardiovascular: Regular rate, 2+ distal pulses. Respiratory:  Normal respiratory effort, symmetrical expansion, no distress.   Extremities:  Moving all four extremities through full ROM without pain.   Neuro:  Alert and oriented.  Interacting  appropriately.   Skin:  Warm, dry, no rash.   Psych: Appropriate affect.    ED Results / Procedures / Treatments   Labs (all labs ordered are listed, but only abnormal results are displayed) Labs Reviewed  COMPREHENSIVE METABOLIC PANEL  ETHANOL  SALICYLATE LEVEL  ACETAMINOPHEN LEVEL  CBC  URINE DRUG SCREEN, QUALITATIVE ([facility] ONLY)     EKG    RADIOLOGY    PROCEDURES:  Critical Care performed: No  Procedures   MEDICATIONS ORDERED IN ED: Medications - No data to display   IMPRESSION / MDM / ASSESSMENT AND PLAN / ED COURSE  I reviewed the triage vital signs and the nursing notes.                              Differential diagnosis includes, but is not limited to, paranoia, worsening schizophrenia, worsening depression, psychosis  Patient's presentation is most consistent with acute presentation with potential threat to life or bodily function.  Patient is a 32 year old male presenting today for worsening paranoia and religious thoughts.  Concern for acute psychosis.  Otherwise no medical  complaints and medically safe for psychiatric evaluation.  Psychiatry was consulted for further care.  Patient was signed out at end of shift pending psychiatric disposition.  The patient has been placed in psychiatric observation due to the need to provide a safe environment for the patient while obtaining psychiatric consultation and evaluation, as well as ongoing medical and medication management to treat the patient's condition.  The patient has not been placed under full IVC at this time.      FINAL CLINICAL IMPRESSION(S) / ED DIAGNOSES   Final diagnoses:  Paranoia (HCC)  Psychosis, unspecified psychosis type (HCC)     Rx / DC Orders   ED Discharge Orders     None        Note:  This document was prepared using Dragon voice recognition software and may include unintentional dictation errors.   Janith Lima, MD 01/20/23 2225

## 2023-01-20 NOTE — ED Notes (Signed)
Pt dressed out in triage room 2 with this Clinical research associate and EDT [place].  Pt dressed out into blue colored BH scrubs.  Pt belongings placed into pt belongings bag and given to pts sister Drue Novel [phone]).  Pt belongings:  White t-shirt  Black sweatpants  Plaid boxer briefs  Black and white tennis shoes  Black socks  Blue jacket
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
## 2023-01-20 NOTE — Telephone Encounter (Signed)
Medication mangement - prior authorization submitted online with CoverMyMeds for patient to restart Invega Sustenna 234 mg/1.5 ml IM injection for initial loading dosage. PA approval pending requested review.

## 2023-01-21 ENCOUNTER — Encounter: Payer: Self-pay | Admitting: Psychiatry

## 2023-01-21 ENCOUNTER — Telehealth (HOSPITAL_COMMUNITY): Payer: Self-pay | Admitting: *Deleted

## 2023-01-21 ENCOUNTER — Ambulatory Visit (HOSPITAL_COMMUNITY): Payer: No Payment, Other

## 2023-01-21 DIAGNOSIS — F250 Schizoaffective disorder, bipolar type: Secondary | ICD-10-CM | POA: Diagnosis not present

## 2023-01-21 LAB — COMPREHENSIVE METABOLIC PANEL
ALT: 25 [IU]/L (ref 0–44)
ALT: 26 U/L (ref 0–44)
AST: 27 [IU]/L (ref 0–40)
AST: 36 U/L (ref 15–41)
Albumin: 5 g/dL (ref 4.3–5.2)
Albumin: 4.4 g/dL (ref 3.5–5.0)
Alkaline Phosphatase: 86 [IU]/L (ref 44–121)
Alkaline Phosphatase: 66 U/L (ref 38–126)
Anion gap: 13 (ref 5–15)
BUN/Creatinine Ratio: 8 — ABNORMAL LOW (ref 9–20)
BUN: 10 mg/dL (ref 6–20)
BUN: 15 mg/dL (ref 6–20)
Bilirubin Total: 0.5 mg/dL (ref 0.0–1.2)
CO2: 23 mmol/L (ref 20–29)
CO2: 20 mmol/L — ABNORMAL LOW (ref 22–32)
Calcium: 10.3 mg/dL — ABNORMAL HIGH (ref 8.7–10.2)
Calcium: 9 mg/dL (ref 8.9–10.3)
Chloride: 100 mmol/L (ref 96–106)
Chloride: 102 mmol/L (ref 98–111)
Creatinine, Ser: 1.28 mg/dL — ABNORMAL HIGH (ref 0.76–1.27)
Creatinine, Ser: 1.28 mg/dL — ABNORMAL HIGH (ref 0.61–1.24)
GFR, Estimated: >60 mL/min (ref >60)
Globulin, Total: 2.8 g/dL (ref 1.5–4.5)
Glucose, Bld: 128 mg/dL — ABNORMAL HIGH (ref 70–99)
Glucose: 98 mg/dL (ref 70–99)
Potassium: 4.6 mmol/L (ref 3.5–5.2)
Potassium: 3.8 mmol/L (ref 3.5–5.1)
Sodium: 139 mmol/L (ref 134–144)
Sodium: 135 mmol/L (ref 135–145)
Total Bilirubin: 0.9 mg/dL (ref 0.3–1.2)
Total Protein: 7.8 g/dL (ref 6.0–8.5)
Total Protein: 7.5 g/dL (ref 6.5–8.1)
eGFR: 78 mL/min/{1.73_m2} (ref >59)

## 2023-01-21 LAB — CBC
HCT: 43.6 % (ref 39.0–52.0)
Hemoglobin: 15.2 g/dL (ref 13.0–17.0)
MCH: 30.9 pg (ref 26.0–34.0)
MCHC: 34.9 g/dL (ref 30.0–36.0)
MCV: 88.6 fL (ref 80.0–100.0)
Platelets: 215 10*3/uL (ref 150–400)
RBC: 4.92 MIL/uL (ref 4.22–5.81)
RDW: 13 % (ref 11.5–15.5)
WBC: 9 10*3/uL (ref 4.0–10.5)
nRBC: 0 % (ref 0.0–0.2)

## 2023-01-21 LAB — CBC WITH DIFFERENTIAL/PLATELET
Basophils Absolute: 0 10*3/uL (ref 0.0–0.2)
Basos: 1 %
EOS (ABSOLUTE): 0.2 10*3/uL (ref 0.0–0.4)
Eos: 3 %
Hematocrit: 48 % (ref 37.5–51.0)
Hemoglobin: 15.9 g/dL (ref 13.0–17.7)
Immature Grans (Abs): 0 10*3/uL (ref 0.0–0.1)
Immature Granulocytes: 0 %
Lymphocytes Absolute: 0.6 10*3/uL — ABNORMAL LOW (ref 0.7–3.1)
Lymphs: 11 %
MCH: 30.9 pg (ref 26.6–33.0)
MCHC: 33.1 g/dL (ref 31.5–35.7)
MCV: 93 fL (ref 79–97)
Monocytes Absolute: 0.6 10*3/uL (ref 0.1–0.9)
Monocytes: 13 %
Neutrophils Absolute: 3.5 10*3/uL (ref 1.4–7.0)
Neutrophils: 72 %
Platelets: 244 10*3/uL (ref 150–450)
RBC: 5.15 x10E6/uL (ref 4.14–5.80)
RDW: 13.4 % (ref 11.6–15.4)
WBC: 4.9 10*3/uL (ref 3.4–10.8)

## 2023-01-21 LAB — TROPONIN I (HIGH SENSITIVITY): Troponin I (High Sensitivity): 5 ng/L (ref <18)

## 2023-01-21 LAB — LIPID PANEL
Chol/HDL Ratio: 2.1 ratio (ref 0.0–5.0)
Cholesterol, Total: 194 mg/dL (ref 100–199)
HDL: 92 mg/dL (ref >39)
LDL Chol Calc (NIH): 81 mg/dL (ref 0–99)
Triglycerides: 123 mg/dL (ref 0–149)
VLDL Cholesterol Cal: 21 mg/dL (ref 5–40)

## 2023-01-21 LAB — THYROID PANEL WITH TSH
Free Thyroxine Index: 2.3 (ref 1.2–4.9)
T3 Uptake Ratio: 27 % (ref 24–39)
T4, Total: 8.6 ug/dL (ref 4.5–12.0)
TSH: 1.7 u[IU]/mL (ref 0.450–4.500)

## 2023-01-21 LAB — PROLACTIN: Prolactin: 20.2 ng/mL (ref 3.6–31.5)

## 2023-01-21 LAB — HEMOGLOBIN A1C
Est. average glucose Bld gHb Est-mCnc: 114 mg/dL
Hgb A1c MFr Bld: 5.6 % (ref 4.8–5.6)

## 2023-01-21 LAB — ETHANOL: Alcohol, Ethyl (B): <10 mg/dL (ref <10)

## 2023-01-21 LAB — HEPATIC FUNCTION PANEL: Bilirubin, Direct: 0.17 mg/dL (ref 0.00–0.40)

## 2023-01-21 LAB — SALICYLATE LEVEL: Salicylate Lvl: <7 mg/dL — ABNORMAL LOW (ref 7.0–30.0)

## 2023-01-21 LAB — ACETAMINOPHEN LEVEL: Acetaminophen (Tylenol), Serum: <10 ug/mL — ABNORMAL LOW (ref 10–30)

## 2023-01-21 MED ORDER — PALIPERIDONE ER 3 MG PO TB24
3.0000 mg | ORAL_TABLET | Freq: Every day | ORAL | Status: DC
  Administered 2023-01-21: 3 mg via ORAL
  Filled 2023-01-21 (×2): qty 1

## 2023-01-21 MED ORDER — QUETIAPINE FUMARATE 200 MG PO TABS
200.0000 mg | ORAL_TABLET | Freq: Every day | ORAL | Status: DC
  Administered 2023-01-21 (×2): 200 mg via ORAL
  Filled 2023-01-21 (×2): qty 1

## 2023-01-21 NOTE — BH Assessment (Signed)
Patient has been accepted to [facility].  Patient assigned to [facility] Accepting physician is Dr. Hewitt Shorts.  Call report to [phone].  Representative was Dewana.    ER Staff is aware of it:  Marylene Land, ER Mila Merry, Patient's Nurse     Address: [address] [place], [place] [address]

## 2023-01-21 NOTE — ED Notes (Signed)
VOL/  PENDING  CONSULT/  PT  MOVED  TO  [facility]

## 2023-01-21 NOTE — ED Notes (Signed)
Pt Vol Larry Simmons consult/disposition pending.

## 2023-01-21 NOTE — BH Assessment (Signed)
TTS followed with [facility] Debbe Bales M.) about patient's recommendation for inpatient treatment.
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
## 2023-01-21 NOTE — ED Notes (Signed)
Patient up to the door wanting to use the phone, we let him know that He could use the phone at 9am, He was ok with that. Staff will continue to monitor.

## 2023-01-21 NOTE — ED Provider Notes (Signed)
-----------------------------------------   1:07 AM on 01/21/2023 -----------------------------------------   Patient evaluated by overnight psychiatry team who recommends inpatient hospitalization. Patient noted to psychiatric provider that his "heart hurts". Will obtain EKG and check troponin.  ED ECG REPORT I, Tynlee Bayle J, the attending physician, personally viewed and interpreted this ECG.   Date: 01/21/2023  EKG Time: 0116  Rate: 107  Rhythm: sinus tachycardia  Axis: Normal  Intervals:none  ST&T Change: Nonspecific   ----------------------------------------- 3:10 AM on 01/21/2023 -----------------------------------------   Laboratory results including troponin unremarkable.   Irean Hong, MD 01/21/23 [phone]
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
## 2023-01-21 NOTE — Telephone Encounter (Signed)
Fax received for prior authorization approval of Invega Sustenna 234mg . Called to notify pharmacy.

## 2023-01-21 NOTE — ED Notes (Signed)
Patient is sitting up and is guarded, when Tech took His supper tray in the room, she woke him up to give him His supper and He started screaming. He quickly calmed down, staff will continue to monitor, nurse ask him if He was ok, and He said " Yes".

## 2023-01-21 NOTE — ED Notes (Signed)
[place] [facility] STATED CANNOT TRANSPORT UNTIL 01/22/23 am TO [place]

## 2023-01-21 NOTE — ED Notes (Signed)
Pt provided with dinner tray. Pt sitting up eating

## 2023-01-21 NOTE — BH Assessment (Addendum)
Comprehensive Clinical Assessment (CCA) Screening, Triage and Referral Note  01/21/2023 Larry Simmons [id number] Recommendations for Services/Supports/Treatments: Psych consult/disposition pending. Larry Simmons is a 32 y.o., Non-Hispanic or Latino ethnicity, English speaking male with a history of schizophrenia spectrum disorder, type unknonwn. Per triage note: Pt presents to ER with sister with c/o mental health eval. Pts sister states that pt has been dealing with some irrational thoughts. Pt's sister also reporting that pt took his fathers car and drove off without telling anyone. When asked what pt is feeling, pt states "only God can judge me" and "my spirits been talking to me." Pt denies SI, but is having some thoughts of harming his ex-fiance. Pt is otherwise alert, and cooperative in triage.  Pt presented with a labile mood and a tearful affect. Pt was alert and oriented x3. Pt had circumstantial speech but was able to answer most assessment questions. Pt identified the reason he'd presented to the ED as "I keep hearing my spirits that's why I'm depressed". Pt was paranoid and delusional. Pt reported that he wants to go to Larry Simmons and pray because he's God. Pt was hyperreligious. Pt reported that the spirits don't disturb him. Pt had fair insight and impaired judgment. Pt expressed a desire for help with his medications, explaining that that he feels numb when taking his current medications. Pt presented with restless psychomotor activity. Pt denies SI. Pt endorsed a desire to harm his ex-fiance. Pt denied current VH; however reported seeing things as a child.  Chief Complaint:  Chief Complaint  Patient presents with   Mental Health Problem   Visit Diagnosis: Schizophrenia spectrum disorder  Patient Reported Information How did you hear about [place]? Self  What Is the Reason for Your Visit/Call Today? Pt presents to ER with sister with c/o mental health eval.  Pts sister states  that pt has been dealing with some irrational thoughts.  Pt's sister also reporting that pt took his fathers car and drove off without telling anyone.  When asked what pt is feeling, pt states "only God can judge me" and "my spirits been talking to me."  Pt denies SI, but is having some thoughts of harming his ex-fiance.  Pt is otherwise alert, and cooperative in triage.  How Long Has This Been Causing You Problems? <Week  What Do You Feel Would Help You the Most Today? Stress Management; Social Support   Have You Recently Had Any Thoughts About Hurting Yourself? No  Are You Planning to Commit Suicide/Harm Yourself At This time? No   Have you Recently Had Thoughts About Hurting Someone Larry Simmons? No  Are You Planning to Harm Someone at This Time? No  Explanation: No data recorded  Have You Used Any Alcohol or Drugs in the Past 24 Hours? No  How Long Ago Did You Use Drugs or Alcohol? No data recorded What Did You Use and How Much? No data recorded  Do You Currently Have a Therapist/Psychiatrist? No data recorded Name of Therapist/Psychiatrist: No data recorded  Have You Been Recently Discharged From Any Office Practice or Programs? No data recorded Explanation of Discharge From Practice/Program: No data recorded   CCA Screening Triage Referral Assessment Type of Contact: No data recorded Telemedicine Service Delivery:   Is this Initial or Reassessment?   Date Telepsych consult ordered in [facility]:    Time Telepsych consult ordered in [facility]:    Location of Assessment: No data recorded Provider Location: No data recorded   Collateral Involvement: No data recorded  Does Patient Have a Automotive engineer Guardian? No data recorded Name and Contact of Legal Guardian: No data recorded If Minor and Not Living with Parent(s), Who has Custody? No data recorded Is CPS involved or ever been involved? No data recorded Is APS involved or ever been involved? No data recorded  Patient Determined  To Be At Risk for Harm To Self or Others Based on Review of Patient Reported Information or Presenting Complaint? No data recorded Method: No data recorded Availability of Means: No data recorded Intent: No data recorded Notification Required: No data recorded Additional Information for Danger to Others Potential: No data recorded Additional Comments for Danger to Others Potential: No data recorded Are There Guns or Other Weapons in Your Home? No data recorded Types of Guns/Weapons: No data recorded Are These Weapons Safely Secured?                            No data recorded Who Could Verify You Are Able To Have These Secured: No data recorded Do You Have any Outstanding Charges, Pending Court Dates, Parole/Probation? No data recorded Contacted To Inform of Risk of Harm To Self or Others: No data recorded  Does Patient Present under Involuntary Commitment? No data recorded   [place] of Residence: No data recorded  Patient Currently Receiving the Following Services: No data recorded  Determination of Need: Routine (7 days)   Options For Referral: Outpatient Therapy; Medication Management   Discharge Disposition:     Larry Simmons R Larry Simmons, LCAS

## 2023-01-21 NOTE — ED Notes (Signed)
Lab at bedside

## 2023-01-21 NOTE — ED Notes (Signed)
Green and lavender top sent to lab at this time. Unable to obtain red top after 2 sticks. Called lab at this time due to patient being difficult stick.

## 2023-01-21 NOTE — ED Notes (Signed)
Patient ask to use the phone, He took His po medication without hesitation, He is calm and cooperative, no signs of distress.

## 2023-01-21 NOTE — ED Notes (Signed)
Pt given dinner snack and beverage

## 2023-01-21 NOTE — ED Notes (Signed)
Patient transferred to room 3, oriented to the unit, He appears guarded, but is cooperative and He said ' It is all up to God " Nurse let him know that she would give updates to patient as any changes made, he said " ok" staff will continue to monitor for safety.

## 2023-01-21 NOTE — ED Provider Notes (Signed)
Emergency Medicine Observation Re-evaluation Note  Larry Simmons is a 32 y.o. male, seen on rounds today.  Pt initially presented to the ED for complaints of Mental Health Problem  Currently, the patient is resting comfortably.  Physical Exam  BP (!) 143/84 (BP Location: Right Arm)   Pulse 97   Temp 98.2 F (36.8 C) (Oral)   Resp 18   Ht 6\' 1"  (1.854 m)   Wt 88 kg   SpO2 97%   BMI 25.60 kg/m  General: No acute distress Cardiac: Well-perfused extremities Lungs: No respiratory distress Psych: Appropriate mood and affect  ED Course / MDM  EKG:   I have reviewed the labs performed to date as well as medications administered while in observation.  Recent changes in the last 24 hours include none.  Plan  Current plan is for placement.   Merwyn Katos, MD 01/21/23 [phone]

## 2023-01-21 NOTE — ED Notes (Signed)
NP went and talked to Patient and Patient remained calm and cooperative,.

## 2023-01-21 NOTE — ED Notes (Signed)
Refusing repeat VS at this time

## 2023-01-21 NOTE — ED Notes (Signed)
Transferred to [employer] via wheelchair by Financial planner. Report to wendy, Charity fundraiser. Pt alert and oriented X4, cooperative, RR even and unlabored, color WNL. Pt in NAD.

## 2023-01-21 NOTE — ED Notes (Signed)
This Nurse spoke to Patient's sister on the phone and she wants to make sure NP knows that He skipped sept. Invega injection and that as long as He is on His medications that He is a very Chief Executive Officer and a kind person , she is very concerned and how she says this is not her brother right now and she hopes that He can stay in-patient to ensure that he is ok before He comes home. Family is very supportive.

## 2023-01-21 NOTE — BH Assessment (Signed)
Per [facility]), patient to be referred out of system.  Referral information for Psychiatric Hospitalization faxed to;   [facility] [phone]- [phone]), no available bed  [facility] [phone]- [phone]),   [facility] [phone]),  [place] [phone]--- [phone]--- [phone]--- [phone])  [address] [phone]),   [facility] [phone] -or- [phone]),   [facility] [phone]),  [place] [phone])  [facility] [phone]).  [facility] [place] [phone]).

## 2023-01-21 NOTE — ED Notes (Signed)
Called and cancelled sheriff's transport as patient is not IVC and called for [facility] to transport patient to [facility]
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
## 2023-01-21 NOTE — Telephone Encounter (Signed)
Thank you for this update 

## 2023-01-22 ENCOUNTER — Telehealth (HOSPITAL_COMMUNITY): Payer: Self-pay | Admitting: Psychiatry

## 2023-01-22 LAB — URINE DRUG PANEL 7
Amphetamines, Urine: NEGATIVE ng/mL
Barbiturate Quant, Ur: NEGATIVE ng/mL
Benzodiazepine Quant, Ur: NEGATIVE ng/mL
Cannabinoid Quant, Ur: NEGATIVE ng/mL
Cocaine (Metab.): NEGATIVE ng/mL
Opiate Quant, Ur: NEGATIVE ng/mL
PCP Quant, Ur: NEGATIVE ng/mL

## 2023-01-22 NOTE — ED Notes (Signed)
RN left message with [facility] regarding pt transfer.

## 2023-01-22 NOTE — ED Notes (Signed)
This RN spoke with Cyndia Bent RN at [facility].  Transfer of care report was given.  All questions asked were answered.  Day shift RN to call [phone] option 2, to leave updated set of vitals and inform facility that pt is on the way.  No need to wait for response before sending patient.

## 2023-01-22 NOTE — Telephone Encounter (Signed)
Patient's  sister reports that he has been transferred from [facility]  to [facility].  She notes that she is uncertain what care he is being given as when he calls her he is paranoid.  She is fearful that he will be released from [facility] without treatment. Provider encouraged her to reach out to [facility].  Patients sister notes that she has tried but has not gotten responses.  She notes that her family is now notes that with her weakness they are unable to take contact the patient.  Provider informed the sister that currently he is in a safe place and treatment most likely being rendered.  She endorsed understanding and notes that she will try to reach out to [facility] other concerns noted at this time.

## 2023-01-22 NOTE — ED Notes (Signed)
PT IS ACCEPTED TO [facility], PT IS ASSIGNED TO THE [facility] OF [facility]. ACCEPTING DR. IS  DR. KONDAL MANDARAM REPORT NUMBER [phone] PER CALVIN MANNING.

## 2023-01-26 ENCOUNTER — Ambulatory Visit (HOSPITAL_COMMUNITY): Payer: No Payment, Other

## 2023-02-02 NOTE — Progress Notes (Signed)
Provider called patient and discussed his lab results.  Provider asked patient how is he feeling after his discharge.  He notes that he feels more mentally stable.  Provider instructed patient to bring his discharge summary at his next appointment.  He endorsed understanding and agreed.  No other concerns noted at this time.

## 2023-02-15 ENCOUNTER — Encounter (HOSPITAL_COMMUNITY): Payer: No Payment, Other | Admitting: Psychiatry

## 2023-02-16 ENCOUNTER — Ambulatory Visit (INDEPENDENT_AMBULATORY_CARE_PROVIDER_SITE_OTHER): Payer: No Payment, Other | Admitting: Psychiatry

## 2023-02-16 ENCOUNTER — Encounter (HOSPITAL_COMMUNITY): Payer: Self-pay | Admitting: Psychiatry

## 2023-02-16 DIAGNOSIS — F29 Unspecified psychosis not due to a substance or known physiological condition: Secondary | ICD-10-CM | POA: Diagnosis not present

## 2023-02-16 MED ORDER — INVEGA SUSTENNA 156 MG/ML IM SUSY: 156.0000 mg | PREFILLED_SYRINGE | INTRAMUSCULAR | 11 refills | Status: DC

## 2023-02-16 MED ORDER — PALIPERIDONE ER 6 MG PO TB24: 6.0000 mg | ORAL_TABLET | Freq: Two times a day (BID) | ORAL | 3 refills | Status: DC

## 2023-02-16 MED ORDER — PALIPERIDONE PALMITATE ER 156 MG/ML IM SUSY
156.0000 mg | PREFILLED_SYRINGE | Freq: Once | INTRAMUSCULAR | Status: AC
  Administered 2023-02-25: 156 mg via INTRAMUSCULAR

## 2023-02-16 NOTE — Progress Notes (Signed)
BH MD/PA/NP OP Progress Note    02/16/2023 5:25 PM Larry Simmons  MRN:  [id number]  Chief Complaint: "I am feeling better" Per sister "He has been drinking a lot"  HPI: 32 year old male seen today for follow up psychiatric history of marijuana use,schizophrenia and depression.  Patient presented to [facility] on 01/20/2023 to 01/22/2023 with paranoia.  He was transferred to [facility] from 01/22/2023-01/28/2023.  Patient informed writer that while hospitalized he received 2 Invega injections.  Patient discharged note reports that he is to continue [place] 234 mg monthly as well as Invega 6 mg twice daily.  Provider requested patient records from [facility] however has not received them yet.  Provider utilized discharge paperwork.  Patient notes that his medications are effective in managing his psychiatric conditions however reports that he would like to be maintained on Invega 156 mg monthly as he did well on this before.     Today patient is well groomed, pleasant, cooperative, and engaged in conversation. He informed Clinical research associate that since his hospital discharge he feels better.  He notes that his mood is more stable and reports that he has minimal anxiety and depression.  He also denies paranoia or other symptoms of psychosis.  Today provider conducted a GAD-7 and patient scored a 0. Provider also conducted PHQ-9 and patient scored a 0.  He endorses adequate sleep and appetite.  Today he denies SI/HI/VAH or mania.    Patient sister spoke with provider over the phone.  She informed Clinical research associate that she had patient's discharge information.  She emailed these records to the clinic and they were submitted to medical records.  Patient's sister notes that at times her brother says that he is in a depressive states but is uncertain if she can take his word.  She does note that he is no longer doing bizarre things but reports that he can be moody at times. She reports that he has  been drinking alcohol more often. Provider informed patient and his sister that alcohol can exacerbate his mental health.  Patient denies illegal drug use and alcohol use.  He did somewhat smell like alcohol today.  He informed Clinical research associate that he has not used marijuana or other illegal substances since his last visit with Clinical research associate  Patient reports that he has a job interview next week and is excited about getting back to work.  He also informed Clinical research associate that he has been talking to his ex-girlfriend and they are developing a nonromantic friendship.  Provider conducted an [place] assessment today and patient scored a 0.  Patient and his sister risk reports that he received his last Invega injection on 01/26/2023.  Provider scheduled patient to receive his next Invega injection on Dec 5th 2024 (patient has an interview on  Dec 2nd).  Patient reports that he does not wish to continue Invega 2134 mg monthly but instead Invega 156 mg monthly.  Provider agreeable to this and sent medication to preferred pharmacy.  For now he will continue oral Invega 6 mg twice daily.  Provider informed patient that this dose could be tapered or discontinued if mental health continues to be stable.  He endorsed understanding and agreed.  No other concerns noted at this time.   Visit Diagnosis:    ICD-10-CM   1. Schizophrenia spectrum disorder with psychotic disorder type not yet determined (HCC)  F29 paliperidone (INVEGA SUSTENNA) injection 156 mg    paliperidone (INVEGA SUSTENNA) 156 MG/ML SUSY injection  paliperidone (INVEGA) 6 MG 24 hr tablet           Past Psychiatric History:  Schizophrenia and depression  Past Medical History:  Past Medical History:  Diagnosis Date   Asthma    Eczema    No past surgical history on file.  Family Psychiatric History: Mother- Schizophrenia   Family History: No family history on file.  Social History:  Social History   Socioeconomic History   Marital status: Single    Spouse  name: Not on file   Number of children: 0   Years of education: Not on file   Highest education level: High school graduate  Occupational History   Occupation: Social research officer, government     Comment: Museum/gallery curator  Tobacco Use   Smoking status: Every Day    Current packs/day: 0.50    Average packs/day: 0.5 packs/day for 13.0 years (6.5 ttl pk-yrs)    Types: Cigarettes    Start date: 03/02/2010   Smokeless tobacco: Never   Tobacco comments:    Patient currently using nicotene gum/patches to help quit.   Substance and Sexual Activity   Alcohol use: Yes    Alcohol/week: 4.0 standard drinks of alcohol    Types: 4 Cans of beer per week    Comment: occassionl   Drug use: Not Currently    Types: Marijuana    Comment: Last used 04/2022   Sexual activity: Not Currently    Comment: Last sexual encounter 04/2022  Other Topics Concern   Not on file  Social History Narrative   Not on file   Social Determinants of Health   Financial Resource Strain: Not on file  Food Insecurity: Not on file  Transportation Needs: Not on file  Physical Activity: Not on file  Stress: Not on file  Social Connections: Not on file    Allergies:  Allergies  Allergen Reactions   Iodinated Contrast Media Other (See Comments)    Irritated skin   Shellfish Allergy Swelling   Peanut-Containing Drug Products Swelling    Metabolic Disorder Labs: Lab Results  Component Value Date   HGBA1C 5.6 01/20/2023   MPG 102.54 09/06/2021   MPG 108.28 02/10/2020   Lab Results  Component Value Date   PROLACTIN 20.2 01/20/2023   PROLACTIN 3.2 (L) 09/08/2021   Lab Results  Component Value Date   CHOL 194 01/20/2023   TRIG 123 01/20/2023   HDL 92 01/20/2023   CHOLHDL 2.1 01/20/2023   VLDL 10 09/08/2021   LDLCALC 81 01/20/2023   LDLCALC 99 09/08/2021   Lab Results  Component Value Date   TSH 1.700 01/20/2023   TSH 2.994 09/08/2021    Therapeutic Level Labs: No results found for: "LITHIUM" No results found  for: "VALPROATE" No results found for: "CBMZ"  Current Medications: Current Outpatient Medications  Medication Sig Dispense Refill   [START ON 02/25/2023] paliperidone (INVEGA SUSTENNA) 156 MG/ML SUSY injection Inject 1 mL (156 mg total) into the muscle every 28 (twenty-eight) days. 1 mL 11   buPROPion (WELLBUTRIN XL) 150 MG 24 hr tablet Take 1 tablet (150 mg total) by mouth every morning. 30 tablet 3   paliperidone (INVEGA) 6 MG 24 hr tablet Take 1 tablet (6 mg total) by mouth in the morning and at bedtime. 60 tablet 3   traZODone (DESYREL) 50 MG tablet Take 1 tablet (50 mg total) by mouth at bedtime. 30 tablet 3   Current Facility-Administered Medications  Medication Dose Route Frequency Provider Last Rate Last Admin  paliperidone (INVEGA SUSTENNA) injection 156 mg  156 mg Intramuscular Once          Musculoskeletal: Strength & Muscle Tone: within normal limits Gait & Station: normal Patient leans: N/A  Psychiatric Specialty Exam: Review of Systems  There were no vitals taken for this visit.There is no height or weight on file to calculate BMI.  General Appearance: Well Groomed  Eye Contact:  Good  Speech:  Clear and Coherent and Normal Rate  Volume:  Normal  Mood:  Euthymic  Affect:  Appropriate and Congruent  Thought Process:  Coherent, Goal Directed, and Linear  Orientation:  Full (Time, Place, and Person)  Thought Content: WDL and Logical   Suicidal Thoughts:  No  Homicidal Thoughts:  No  Memory:  Immediate;   Good Recent;   Good Remote;   Good  Judgement:  Good  Insight:  Good  Psychomotor Activity:  Normal  Concentration:  Concentration: Good and Attention Span: Good  Recall:  Good  Fund of Knowledge: Good  Language: Good  Akathisia:  No  Handed:  Right  AIMS (if indicated):  done, WNL, 0  Assets:  Communication Skills Desire for Improvement Financial Resources/Insurance Housing Intimacy Leisure Time Physical Health Social Support  ADL's:  Intact   Cognition: WNL  Sleep:  Good   Screenings: AIMS    Flowsheet Row Office Visit from 10/20/2022 in [facility] Office [facility]  AIMS Total Score 1 0 2      AUDIT    Flowsheet Row Video Visit from 01/20/2023 in [facility] Admission (Discharged) from [facility]  Alcohol Use Disorder Identification Test Final Score (AUDIT) 12 4      GAD-7    Flowsheet Row Clinical Support from 02/16/2023 in [facility] Video Visit from 01/20/2023 in [facility] Clinical Support from 12/08/2022 in [facility] Office Visit from 10/20/2022 in [facility] Office Visit from 06/18/2022 in [facility]  Total GAD-7 Score 0 18 1 5 1       PHQ2-9    Flowsheet Row Clinical Support from 02/16/2023 in [facility] Video Visit from 01/20/2023 in [facility] Clinical Support from 12/08/2022 in [facility] Office Visit from 10/20/2022 in [facility] Office Visit from 06/18/2022 in [facility]  PHQ-2 Total Score 0 2 1 2 3   PHQ-9 Total Score 0 13 2 4 4       Flowsheet Row ED from 01/20/2023 in [facility] at [facility] Visit from 06/18/2022 in [facility] ED from 04/26/2022 in [facility]  C-SSRS RISK CATEGORY No Risk No Risk No Risk        Assessment and Plan: Patient notes that h he is doing well since his hospitalization.Patient and his sister risk reports that he received his last Invega injection on  01/26/2023.  Provider scheduled patient to receive his next Invega injection on Dec 5th 2024 (patient has an interview on  Dec 2nd).  Patient reports that he does not wish to continue Invega 234 mg monthly but instead Invega 156 mg monthly.  Provider agreeable to this and sent medication to preferred pharmacy.  For  now he will continue oral Invega 6 mg twice daily.  Provider informed patient that this dose could be tapered or discontinued if mental health continues to be stable.  He endorsed understanding and agreed.   1. Schizophrenia spectrum disorder with psychotic disorder type not yet determined (HCC)  Start- paliperidone (INVEGA SUSTENNA) injection 156 mg Start- paliperidone (INVEGA SUSTENNA) 156 MG/ML SUSY injection; Inject 1 mL (156 mg total) into the muscle every 28 (twenty-eight) days.  Dispense: 1 mL; Refill: 11 Vontinue- paliperidone (INVEGA) 6 MG 24 hr tablet; Take 1 tablet (6 mg total) by mouth in the morning and at bedtime.  Dispense: 60 tablet; Refill: 3    Collaboration of Care: Collaboration of Care: Other provider involved in patient's care AEB PCP  Patient/Guardian was advised Release of Information must be obtained prior to any record release in order to collaborate their care with an outside provider. Patient/Guardian was advised if they have not already done so to contact the registration department to sign all necessary forms in order for [place] to release information regarding their care.   Consent: Patient/Guardian gives verbal consent for treatment and assignment of benefits for services provided during this visit. Patient/Guardian expressed understanding and agreed to proceed.   Follow up in 2.5 month Follow up in one week with shot clinic Shanna Cisco, NP 02/16/2023, 5:25 PM

## 2023-02-23 ENCOUNTER — Ambulatory Visit (HOSPITAL_COMMUNITY): Payer: No Payment, Other

## 2023-02-25 ENCOUNTER — Ambulatory Visit (INDEPENDENT_AMBULATORY_CARE_PROVIDER_SITE_OTHER): Payer: 59

## 2023-02-25 ENCOUNTER — Encounter (HOSPITAL_COMMUNITY): Payer: Self-pay

## 2023-02-25 VITALS — BP 128/87 | HR 87 | Ht 73.0 in | Wt 209.1 lb

## 2023-02-25 DIAGNOSIS — F29 Unspecified psychosis not due to a substance or known physiological condition: Secondary | ICD-10-CM | POA: Diagnosis not present

## 2023-02-25 DIAGNOSIS — G4700 Insomnia, unspecified: Secondary | ICD-10-CM

## 2023-02-25 DIAGNOSIS — F200 Paranoid schizophrenia: Secondary | ICD-10-CM

## 2023-02-25 DIAGNOSIS — F411 Generalized anxiety disorder: Secondary | ICD-10-CM

## 2023-02-25 NOTE — Progress Notes (Cosign Needed)
Patient arrived for injectionpaliperidone (INVEGA SUSTENNA) injection 156 mg  Tolerated well in Right Arm. Pleasant as Always . NO HI/SI  NOR AH/VH ---[employer]

## 2023-03-25 ENCOUNTER — Ambulatory Visit (INDEPENDENT_AMBULATORY_CARE_PROVIDER_SITE_OTHER): Payer: 59

## 2023-03-25 ENCOUNTER — Encounter (HOSPITAL_COMMUNITY): Payer: Self-pay

## 2023-03-25 DIAGNOSIS — F29 Unspecified psychosis not due to a substance or known physiological condition: Secondary | ICD-10-CM | POA: Diagnosis not present

## 2023-03-25 MED ORDER — PALIPERIDONE PALMITATE ER 156 MG/ML IM SUSY
156.0000 mg | PREFILLED_SYRINGE | Freq: Once | INTRAMUSCULAR | Status: AC
  Administered 2023-03-25: 156 mg via INTRAMUSCULAR

## 2023-03-25 NOTE — Progress Notes (Signed)
 Patient was seen today for injection of INVEGA  SUSTENNA 156 mg/mL. Patient has no complaints regarding shot. Pt denies any AV, AI, SI, and thoughts of harming others.  No falls or pain complications. Patient was parented well groomed with sister in attendance , had plans to attend church on New Years, says it was fun.

## 2023-04-22 ENCOUNTER — Encounter (HOSPITAL_COMMUNITY): Payer: Self-pay

## 2023-04-22 ENCOUNTER — Ambulatory Visit (HOSPITAL_COMMUNITY): Payer: No Payment, Other

## 2023-04-22 ENCOUNTER — Ambulatory Visit (INDEPENDENT_AMBULATORY_CARE_PROVIDER_SITE_OTHER): Payer: 59 | Admitting: *Deleted

## 2023-04-22 ENCOUNTER — Ambulatory Visit (HOSPITAL_COMMUNITY): Payer: 59

## 2023-04-22 VITALS — BP 124/70 | HR 108 | Resp 16 | Ht 73.0 in | Wt 203.2 lb

## 2023-04-22 DIAGNOSIS — F29 Unspecified psychosis not due to a substance or known physiological condition: Secondary | ICD-10-CM

## 2023-04-22 MED ORDER — PALIPERIDONE PALMITATE ER 156 MG/ML IM SUSY
156.0000 mg | PREFILLED_SYRINGE | Freq: Once | INTRAMUSCULAR | Status: AC
  Administered 2023-04-22: 156 mg via INTRAMUSCULAR

## 2023-04-22 NOTE — Progress Notes (Signed)
 Patient arrived with his injection of [place] 156mg  that he brought from his pharmacy. States his medication is working well. Denies SI/HI or AV hallucinations. Was well dressed with cologne on and states he had a job interview at [employer] today. No issues or complaints. Injection prepared as ordered and given in Right Deltoid. Pleasant, cooperative with affect that brightens on approach.

## 2023-04-27 ENCOUNTER — Encounter (HOSPITAL_COMMUNITY): Payer: No Payment, Other | Admitting: Psychiatry

## 2023-05-20 ENCOUNTER — Ambulatory Visit (HOSPITAL_COMMUNITY): Payer: MEDICAID

## 2023-05-25 ENCOUNTER — Ambulatory Visit (HOSPITAL_COMMUNITY): Payer: MEDICAID

## 2023-06-09 ENCOUNTER — Ambulatory Visit (HOSPITAL_COMMUNITY): Admission: EM | Admit: 2023-06-09 | Discharge: 2023-06-09 | Discharge status: Against Medical Advice | Payer: MEDICAID

## 2023-06-09 ENCOUNTER — Ambulatory Visit (HOSPITAL_COMMUNITY)
Admission: EM | Admit: 2023-06-09 | Discharge: 2023-06-11 | Disposition: A | Discharge status: Other Acute Inpatient Hospital | Payer: MEDICAID | Attending: Nurse Practitioner | Admitting: Nurse Practitioner

## 2023-06-09 ENCOUNTER — Telehealth (HOSPITAL_COMMUNITY): Payer: Self-pay | Admitting: Psychiatry

## 2023-06-09 ENCOUNTER — Other Ambulatory Visit: Payer: Self-pay

## 2023-06-09 DIAGNOSIS — F200 Paranoid schizophrenia: Secondary | ICD-10-CM | POA: Insufficient documentation

## 2023-06-09 DIAGNOSIS — Z91128 Patient's intentional underdosing of medication regimen for other reason: Secondary | ICD-10-CM | POA: Insufficient documentation

## 2023-06-09 DIAGNOSIS — R45851 Suicidal ideations: Secondary | ICD-10-CM | POA: Insufficient documentation

## 2023-06-09 DIAGNOSIS — T434X6A Underdosing of butyrophenone and thiothixene neuroleptics, initial encounter: Secondary | ICD-10-CM | POA: Insufficient documentation

## 2023-06-09 DIAGNOSIS — F1210 Cannabis abuse, uncomplicated: Secondary | ICD-10-CM | POA: Insufficient documentation

## 2023-06-09 DIAGNOSIS — R45850 Homicidal ideations: Secondary | ICD-10-CM | POA: Insufficient documentation

## 2023-06-09 DIAGNOSIS — F1010 Alcohol abuse, uncomplicated: Secondary | ICD-10-CM | POA: Insufficient documentation

## 2023-06-09 LAB — URINALYSIS, ROUTINE W REFLEX MICROSCOPIC
Bacteria, UA: NONE SEEN
Bilirubin Urine: NEGATIVE
Glucose, UA: NEGATIVE mg/dL
Hgb urine dipstick: NEGATIVE
Ketones, ur: 20 mg/dL — AB
Leukocytes,Ua: NEGATIVE
Nitrite: NEGATIVE
Protein, ur: 30 mg/dL — AB
Specific Gravity, Urine: 1.027 (ref 1.005–1.030)
pH: 5 (ref 5.0–8.0)

## 2023-06-09 LAB — POCT URINE DRUG SCREEN - MANUAL ENTRY (I-SCREEN)
POC Amphetamine UR: NOT DETECTED
POC Buprenorphine (BUP): NOT DETECTED
POC Cocaine UR: NOT DETECTED
POC Marijuana UR: POSITIVE — AB
POC Methadone UR: NOT DETECTED
POC Methamphetamine UR: NOT DETECTED
POC Morphine: NOT DETECTED
POC Oxazepam (BZO): NOT DETECTED
POC Oxycodone UR: NOT DETECTED
POC Secobarbital (BAR): NOT DETECTED

## 2023-06-09 MED ORDER — HALOPERIDOL LACTATE 5 MG/ML IJ SOLN
10.0000 mg | Freq: Three times a day (TID) | INTRAMUSCULAR | Status: DC | PRN
Start: 2023-06-09 — End: 2023-06-09

## 2023-06-09 MED ORDER — LORAZEPAM 2 MG/ML IJ SOLN
2.0000 mg | Freq: Three times a day (TID) | INTRAMUSCULAR | Status: DC | PRN
Start: 2023-06-09 — End: 2023-06-11
  Filled 2023-06-09: qty 1

## 2023-06-09 MED ORDER — MAGNESIUM HYDROXIDE 400 MG/5ML PO SUSP: 30.0000 mL | Freq: Every day | ORAL | Status: DC | PRN

## 2023-06-09 MED ORDER — LORAZEPAM 2 MG/ML IJ SOLN
2.0000 mg | Freq: Three times a day (TID) | INTRAMUSCULAR | Status: DC | PRN
  Administered 2023-06-09: 2 mg via INTRAMUSCULAR

## 2023-06-09 MED ORDER — HALOPERIDOL LACTATE 5 MG/ML IJ SOLN
10.0000 mg | Freq: Three times a day (TID) | INTRAMUSCULAR | Status: DC | PRN
Start: 2023-06-09 — End: 2023-06-11
  Administered 2023-06-09: 10 mg via INTRAMUSCULAR
  Filled 2023-06-09: qty 2

## 2023-06-09 MED ORDER — HALOPERIDOL 5 MG PO TABS: 5.0000 mg | ORAL_TABLET | Freq: Three times a day (TID) | ORAL | Status: DC | PRN

## 2023-06-09 MED ORDER — HALOPERIDOL LACTATE 5 MG/ML IJ SOLN: 5.0000 mg | Freq: Three times a day (TID) | INTRAMUSCULAR | Status: DC | PRN

## 2023-06-09 MED ORDER — HYDROXYZINE HCL 25 MG PO TABS
25.0000 mg | ORAL_TABLET | Freq: Three times a day (TID) | ORAL | Status: DC | PRN
Start: 2023-06-09 — End: 2023-06-11
  Administered 2023-06-10 (×2): 25 mg via ORAL
  Filled 2023-06-09 (×3): qty 1

## 2023-06-09 MED ORDER — ACETAMINOPHEN 325 MG PO TABS: 650.0000 mg | ORAL_TABLET | Freq: Four times a day (QID) | ORAL | Status: DC | PRN

## 2023-06-09 MED ORDER — HYDROXYZINE HCL 25 MG PO TABS: 25.0000 mg | ORAL_TABLET | Freq: Three times a day (TID) | ORAL | Status: DC | PRN

## 2023-06-09 MED ORDER — ACETAMINOPHEN 325 MG PO TABS
650.0000 mg | ORAL_TABLET | Freq: Four times a day (QID) | ORAL | Status: DC | PRN
Start: 2023-06-09 — End: 2023-06-11

## 2023-06-09 MED ORDER — RISPERIDONE 1 MG PO TBDP
1.0000 mg | ORAL_TABLET | Freq: Two times a day (BID) | ORAL | Status: DC
Start: 2023-06-09 — End: 2023-06-11
  Administered 2023-06-10 – 2023-06-11 (×3): 1 mg via ORAL
  Filled 2023-06-09 (×3): qty 1

## 2023-06-09 MED ORDER — TRAZODONE HCL 50 MG PO TABS: 50.0000 mg | ORAL_TABLET | Freq: Every evening | ORAL | Status: DC | PRN

## 2023-06-09 MED ORDER — ALUM & MAG HYDROXIDE-SIMETH 200-200-20 MG/5ML PO SUSP: 30.0000 mL | ORAL | Status: DC | PRN

## 2023-06-09 MED ORDER — LORAZEPAM 2 MG/ML IJ SOLN: 2.0000 mg | Freq: Three times a day (TID) | INTRAMUSCULAR | Status: DC | PRN

## 2023-06-09 MED ORDER — OLANZAPINE 5 MG PO TBDP: 5.0000 mg | ORAL_TABLET | Freq: Three times a day (TID) | ORAL | Status: DC | PRN

## 2023-06-09 MED ORDER — DIPHENHYDRAMINE HCL 50 MG/ML IJ SOLN
50.0000 mg | Freq: Three times a day (TID) | INTRAMUSCULAR | Status: DC | PRN
Start: 2023-06-09 — End: 2023-06-11
  Filled 2023-06-09: qty 1

## 2023-06-09 MED ORDER — DIPHENHYDRAMINE HCL 50 MG PO CAPS: 50.0000 mg | ORAL_CAPSULE | Freq: Three times a day (TID) | ORAL | Status: DC | PRN

## 2023-06-09 MED ORDER — DIPHENHYDRAMINE HCL 50 MG/ML IJ SOLN
50.0000 mg | Freq: Three times a day (TID) | INTRAMUSCULAR | Status: DC | PRN
  Administered 2023-06-09: 50 mg via INTRAMUSCULAR

## 2023-06-09 MED ORDER — DIPHENHYDRAMINE HCL 50 MG/ML IJ SOLN
50.0000 mg | Freq: Three times a day (TID) | INTRAMUSCULAR | Status: DC | PRN
Start: 2023-06-09 — End: 2023-06-09

## 2023-06-09 NOTE — ED Notes (Signed)
 Patient is resting in recliner bed with eyes closed without any distress noted. Patient will continue to be monitor for safety and for any changes in condition.
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
## 2023-06-09 NOTE — ED Notes (Signed)
 Pt arrived to the unit, staff oriented him. Pt is experiencing paranoia and delusions. He is having religious based delusions: stating he has angels in his skin. He denies SI, but admits to HI directed towards his ex fiance. He states, "I'm not crazy! I only want to kill my ex fiance. She is a Ambulance person." The pt is boisterous but remains cooperative at this time. He does have the potential to become labile and escalate. Staff provided food & drink. Will re-attempt to collect blood later. He remains under an IVC petitioned by sister d/t psychotic behavior (running away from where he was staying and crawling around on all four in the middle of the road).

## 2023-06-09 NOTE — ED Notes (Signed)
 Patient was noted on unit, attempting to pray for another patient on the flex unit, the other patient became visibly agitated, therefore this patient was moved from flex bed 4 to adult bed 3 for his safety. Once patient was moved he became visibly upset, praying real loudly and crying.

## 2023-06-09 NOTE — Telephone Encounter (Signed)
 Provider attempted to call patient and his sister without success.  A voicemail was left instructing family to take patient to [facility] if possible for further assessment.  Provider also noted that if patient's family believes that he is a danger to himself or other to go to the magistrate office for further assistance.  Provider has not seen the patient since November.
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
## 2023-06-09 NOTE — ED Notes (Signed)
 During shift report, patient can be heard talking to himself, yelling loudly as if he's speaking in tongues. He asked to pray with staff and was seen crying during the prayer. The patient did not have aggressive behaviors, he was labile, intrusive, anxious, and restless. He denies SI and HI and states "I'm going to let God handle it". He reports hearing voices saying "stay righteous" and reports seeing our angles. Patient reports not sleeping good, denies having night mares. Patient will continue to be monitor for safety and for any changes in condition.

## 2023-06-09 NOTE — Progress Notes (Signed)
 LCSW Progress Note  [id number]   ADELAIDO NICKLAUS  06/09/2023  1:29 PM  Description:   Inpatient Psychiatric Referral  Patient was recommended inpatient per [name] NP). There are no available beds at [facility], per [facility] RN). Patient was referred to the following out of network facilities:   Destination  Service Provider Address Phone Fax  [facility] [address], [place] [address] [phone]  [facility] [address] [place], [place] [address] [phone]  [facility] [address] [place] [address] [phone]  [facility] [address] [place] [address] [phone]  [facility] [address] [place] [address] [phone]  [facility] EFAX [address], [place] [phone]  [facility] [address] [place] [address] [phone]  [facility] [address] [place] [address] [phone]      Situation ongoing, CSW to continue following and update chart as more information becomes available.      Cathie Beams, MSW, LCSW  06/09/2023 1:29 PM

## 2023-06-10 ENCOUNTER — Telehealth (HOSPITAL_COMMUNITY): Payer: Self-pay

## 2023-06-10 LAB — COMPREHENSIVE METABOLIC PANEL
ALT: 27 U/L (ref 0–44)
AST: 51 U/L — ABNORMAL HIGH (ref 15–41)
Albumin: 4.4 g/dL (ref 3.5–5.0)
Alkaline Phosphatase: 52 U/L (ref 38–126)
Anion gap: 10 (ref 5–15)
BUN: 7 mg/dL (ref 6–20)
CO2: 23 mmol/L (ref 22–32)
Calcium: 9.4 mg/dL (ref 8.9–10.3)
Chloride: 103 mmol/L (ref 98–111)
Creatinine, Ser: 1.03 mg/dL (ref 0.61–1.24)
GFR, Estimated: >60 mL/min (ref >60)
Glucose, Bld: 86 mg/dL (ref 70–99)
Potassium: 3.8 mmol/L (ref 3.5–5.1)
Sodium: 136 mmol/L (ref 135–145)
Total Bilirubin: 1.1 mg/dL (ref 0.0–1.2)
Total Protein: 7.4 g/dL (ref 6.5–8.1)

## 2023-06-10 LAB — CBC WITH DIFFERENTIAL/PLATELET
Abs Immature Granulocytes: 0.01 10*3/uL (ref 0.00–0.07)
Basophils Absolute: 0 10*3/uL (ref 0.0–0.1)
Basophils Relative: 0 %
Eosinophils Absolute: 0.1 10*3/uL (ref 0.0–0.5)
Eosinophils Relative: 3 %
HCT: 45 % (ref 39.0–52.0)
Hemoglobin: 15.6 g/dL (ref 13.0–17.0)
Immature Granulocytes: 0 %
Lymphocytes Relative: 15 %
Lymphs Abs: 0.5 10*3/uL — ABNORMAL LOW (ref 0.7–4.0)
MCH: 30.9 pg (ref 26.0–34.0)
MCHC: 34.7 g/dL (ref 30.0–36.0)
MCV: 89.1 fL (ref 80.0–100.0)
Monocytes Absolute: 0.5 10*3/uL (ref 0.1–1.0)
Monocytes Relative: 15 %
Neutro Abs: 2.2 10*3/uL (ref 1.7–7.7)
Neutrophils Relative %: 67 %
Platelets: 211 10*3/uL (ref 150–400)
RBC: 5.05 MIL/uL (ref 4.22–5.81)
RDW: 12.8 % (ref 11.5–15.5)
WBC: 3.4 10*3/uL — ABNORMAL LOW (ref 4.0–10.5)
nRBC: 0 % (ref 0.0–0.2)

## 2023-06-10 LAB — HEMOGLOBIN A1C
Hgb A1c MFr Bld: 5.3 % (ref 4.8–5.6)
Mean Plasma Glucose: 105.41 mg/dL

## 2023-06-10 LAB — MAGNESIUM: Magnesium: 2.1 mg/dL (ref 1.7–2.4)

## 2023-06-10 LAB — LIPID PANEL
Cholesterol: 161 mg/dL (ref 0–200)
HDL: 81 mg/dL (ref >40)
LDL Cholesterol: 74 mg/dL (ref 0–99)
Total CHOL/HDL Ratio: 2 ratio
Triglycerides: 31 mg/dL (ref <150)
VLDL: 6 mg/dL (ref 0–40)

## 2023-06-10 LAB — ETHANOL: Alcohol, Ethyl (B): <10 mg/dL (ref <10)

## 2023-06-10 LAB — TSH: TSH: 1.783 u[IU]/mL (ref 0.350–4.500)

## 2023-06-10 MED ORDER — HYDROXYZINE HCL 25 MG PO TABS: 25.0000 mg | ORAL_TABLET | Freq: Three times a day (TID) | ORAL | Status: DC | PRN

## 2023-06-10 MED ORDER — TRAZODONE HCL 50 MG PO TABS: 50.0000 mg | ORAL_TABLET | Freq: Every evening | ORAL | Status: DC | PRN

## 2023-06-10 MED ORDER — TRAZODONE HCL 50 MG PO TABS
50.0000 mg | ORAL_TABLET | Freq: Every evening | ORAL | Status: DC | PRN
  Administered 2023-06-10: 50 mg via ORAL
  Filled 2023-06-10 (×2): qty 1

## 2023-06-10 MED ORDER — BENZTROPINE MESYLATE 0.5 MG PO TABS
0.5000 mg | ORAL_TABLET | Freq: Two times a day (BID) | ORAL | Status: DC
  Administered 2023-06-10 – 2023-06-11 (×3): 0.5 mg via ORAL
  Filled 2023-06-10 (×3): qty 1

## 2023-06-10 MED ORDER — RISPERIDONE 1 MG PO TBDP: 1.0000 mg | ORAL_TABLET | Freq: Two times a day (BID) | ORAL | Status: DC

## 2023-06-10 MED ORDER — BENZTROPINE MESYLATE 0.5 MG PO TABS: 0.5000 mg | ORAL_TABLET | Freq: Two times a day (BID) | ORAL | Status: DC

## 2023-06-10 NOTE — ED Notes (Signed)
 Patient is currently on the phone, patient appears to be in no acute distress will continue to monitor patient for safety.

## 2023-06-10 NOTE — ED Notes (Signed)
 Patient currently asleep. Breathing even and unlabored with even rise and fall of chest. No s/s of current distress.

## 2023-06-10 NOTE — ED Provider Notes (Signed)
 Behavioral Health Progress Note  Date and Time: 06/10/2023 10:16 AM Name: KEY CEN MRN:  [id number]  HPI: Larry Simmons is a 33 y.o. AA male with a history of schizophrenia, not medication compliant brought in by [facility], after being petitioned for commitment by his sister Drue Novel).  As per petition:   "THE RESPONDENT HAS BEEN DIAGNOSED WITH PSYCHOSIS BUT HAS NOT BEEN TAKING HIS MEDICATION. TODAY HE FLED THE [name]. BEGAN RUNNING IN THE MIDDLE OF HIS SUITE ON ALL FOUR'S THIS OCCURRED ON [place]. IN ADDITION,HE WENT TO HIS EX-FIANCE'S HOUSE BEGAN BANGING ON THE DOOR AND TOOK HER TAG OFF HER CAR. THE RESPONDENT STATED THAT HE WANTS TO KILL HIS EX-FIANCE"  Today's Patient assessment: During today's encounter, pt is lying in the recliner resting with no signs of distress, he states that sleep was "descent", reports that the last auditory hallucinations were last night, he denies HI currently, denies SI, denies VH. As per nursing documentation and reports, patient has been religiously preoccupied, attempting to pray for other patients. He has been responding to some sort of stimuli, talking to self, but with no aggressive behaviors noted or reported in the past 24 hrs.   Patient is compliant with medications, denies medication related side effects. No TD/EPS type symptoms found on assessment, and pt denies any feelings of stiffness. AIMS: 0. Risperdal 1mg  BID started yesterday with a goal of LAI prior to discharge from inpatient since patient is non compliant with medications.  IVC continues to be upheld, recommendation continues to be inpatient hospitalization for treatment and stabilization of thought disorder prior to discharge. We are continuing medications as listed below, and have also reached out to [facility] regarding reviewing patient for possible admission at [facility]. Continuing medications as listed below while awaiting inpatient placement. Labs are  pending. Nursing have collected today and sent to lab. EKG reviewed, Qtc WNL.  Diagnosis:  Final diagnoses:  Schizophrenia, paranoid (HCC)    Total Time spent with patient: 30 minutes  Past Psychiatric History: Schizophrenia  Past Medical History: Denies  Family History: Denies  Family Psychiatric  History: Denies  Social History: non provided  Additional Social History:    Pain Medications: See MAR Prescriptions: See MAR Over the Counter: See MAR History of alcohol / drug use?: Yes Longest period of sobriety (when/how long): THC use - most days    Sleep: Fair  Appetite:  Fair  Current Medications:  Current Facility-Administered Medications  Medication Dose Route Frequency Provider Last Rate Last Admin   acetaminophen (TYLENOL) tablet 650 mg  650 mg Oral Q6H PRN Jacquelyne Quarry, NP       alum & mag hydroxide-simeth (MAALOX/MYLANTA) 200-200-20 MG/5ML suspension 30 mL  30 mL Oral Q4H PRN Jatavian Calica, NP       benztropine (COGENTIN) tablet 0.5 mg  0.5 mg Oral BID Beverley Sherrard, NP       haloperidol (HALDOL) tablet 5 mg  5 mg Oral TID PRN [name], NP       And   diphenhydrAMINE (BENADRYL) capsule 50 mg  50 mg Oral TID PRN [name], NP       haloperidol lactate (HALDOL) injection 10 mg  10 mg Intramuscular TID PRN [name], NP   10 mg at 06/09/23 2056   And   diphenhydrAMINE (BENADRYL) injection 50 mg  50 mg Intramuscular TID PRN [name], NP       And   LORazepam (ATIVAN) injection 2 mg  2 mg Intramuscular TID PRN [name], NP       diphenhydrAMINE (BENADRYL) injection 50 mg  50 mg Intramuscular TID PRN [name], NP   50 mg at 06/09/23 2057   And   LORazepam (ATIVAN) injection 2 mg  2 mg Intramuscular TID PRN [name], NP   2 mg at 06/09/23 2056   hydrOXYzine (ATARAX) tablet 25 mg  25 mg Oral TID PRN [name], NP   25 mg at 06/10/23 0919   magnesium hydroxide (MILK OF MAGNESIA) suspension 30 mL  30 mL Oral Daily PRN [name], NP       risperiDONE (RISPERDAL M-TABS) disintegrating tablet 1 mg  1 mg Oral BID [name], NP   1 mg at 06/10/23 0919   traZODone (DESYREL) tablet 50 mg  50 mg Oral QHS PRN [name], NP       Current Outpatient Medications  Medication Sig Dispense Refill   buPROPion (WELLBUTRIN XL) 150 MG 24 hr tablet Take 1 tablet (150 mg total) by mouth every morning. (Patient not taking: Reported on 06/09/2023) 30 tablet 3   paliperidone (INVEGA SUSTENNA) 156 MG/ML SUSY injection Inject 1 mL (156 mg total) into the muscle every 28 (twenty-eight) days. 1 mL 11   paliperidone (INVEGA) 6 MG 24 hr tablet Take 1 tablet (6 mg total) by mouth in the morning and at bedtime. 60 tablet 3   traZODone (DESYREL) 50 MG tablet Take 1 tablet (50 mg total) by mouth at bedtime. (Patient not taking: Reported on 06/09/2023) 30 tablet 3    Labs  Lab Results:  Admission on 06/09/2023  Component Date Value Ref Range Status   POC Amphetamine UR 06/09/2023 None Detected  NONE DETECTED (Cut Off Level 1000 ng/mL) Final   POC Secobarbital (BAR) 06/09/2023 None Detected  NONE DETECTED (Cut Off Level 300 ng/mL) Final   POC Buprenorphine (BUP) 06/09/2023 None Detected  NONE DETECTED (Cut Off Level 10 ng/mL) Final   POC Oxazepam (BZO) 06/09/2023 None Detected  NONE DETECTED (Cut Off Level 300 ng/mL) Final   POC Cocaine UR 06/09/2023 None Detected  NONE DETECTED (Cut Off Level 300 ng/mL) Final   POC Methamphetamine UR 06/09/2023 None Detected  NONE DETECTED (Cut Off Level 1000 ng/mL) Final   POC Morphine 06/09/2023 None Detected  NONE DETECTED (Cut Off Level 300 ng/mL) Final   POC Methadone UR 06/09/2023 None Detected  NONE DETECTED (Cut Off Level 300 ng/mL) Final   POC Oxycodone UR 06/09/2023 None Detected  NONE DETECTED (Cut Off Level 100 ng/mL) Final   POC Marijuana UR 06/09/2023 Positive (A)  NONE DETECTED (Cut Off Level 50 ng/mL) Final   Color, Urine 06/09/2023 AMBER (A)  YELLOW Final   BIOCHEMICALS MAY BE  AFFECTED BY COLOR   APPearance 06/09/2023 HAZY (A)  CLEAR Final   Specific Gravity, Urine 06/09/2023 1.027  1.005 - 1.030 Final   pH 06/09/2023 5.0  5.0 - 8.0 Final   Glucose, UA 06/09/2023 NEGATIVE  NEGATIVE mg/dL Final   Hgb urine dipstick 06/09/2023 NEGATIVE  NEGATIVE Final   Bilirubin Urine 06/09/2023 NEGATIVE  NEGATIVE Final   Ketones, ur 06/09/2023 20 (A)  NEGATIVE mg/dL Final   Protein, ur 52/84/1324 30 (A)  NEGATIVE mg/dL Final   Nitrite 40/12/2723 NEGATIVE  NEGATIVE Final   Leukocytes,Ua 06/09/2023 NEGATIVE  NEGATIVE Final   RBC / HPF 06/09/2023 0-5  0 - 5 RBC/hpf Final   WBC, UA 06/09/2023 0-5  0 - 5 WBC/hpf Final   Bacteria,  UA 06/09/2023 NONE SEEN  NONE SEEN Final   Squamous Epithelial / HPF 06/09/2023 0-5  0 - 5 /HPF Final   Mucus 06/09/2023 PRESENT   Final   Performed at [facility], [address] [place], [place] [address]  Admission on 01/20/2023, Discharged on 01/22/2023  Component Date Value Ref Range Status   Sodium 01/21/2023 135  135 - 145 mmol/L Final   Potassium 01/21/2023 3.8  3.5 - 5.1 mmol/L Final   Chloride 01/21/2023 102  98 - 111 mmol/L Final   CO2 01/21/2023 20 (L)  22 - 32 mmol/L Final   Glucose, Bld 01/21/2023 128 (H)  70 - 99 mg/dL Final   Glucose reference range applies only to samples taken after fasting for at least 8 hours.   BUN 01/21/2023 15  6 - 20 mg/dL Final   Creatinine, Ser 01/21/2023 1.28 (H)  0.61 - 1.24 mg/dL Final   Calcium 64/40/3474 9.0  8.9 - 10.3 mg/dL Final   Total Protein 25/95/6387 7.5  6.5 - 8.1 g/dL Final   Albumin 56/43/3295 4.4  3.5 - 5.0 g/dL Final   AST 18/84/1660 36  15 - 41 U/L Final   ALT 01/21/2023 26  0 - 44 U/L Final   Alkaline Phosphatase 01/21/2023 66  38 - 126 U/L Final   Total Bilirubin 01/21/2023 0.9  0.3 - 1.2 mg/dL Final   GFR, Estimated 01/21/2023 >60  >60 mL/min Final   Comment: (NOTE) Calculated using the CKD-EPI Creatinine Equation (2021)    Anion gap 01/21/2023 13  5 - 15 Final   Performed  at [facility], [address] [place], [place] [address]   WBC 01/21/2023 9.0  4.0 - 10.5 K/uL Final   RBC 01/21/2023 4.92  4.22 - 5.81 MIL/uL Final   Hemoglobin 01/21/2023 15.2  13.0 - 17.0 g/dL Final   HCT 03/31/3233 43.6  39.0 - 52.0 % Final   MCV 01/21/2023 88.6  80.0 - 100.0 fL Final   MCH 01/21/2023 30.9  26.0 - 34.0 pg Final   MCHC 01/21/2023 34.9  30.0 - 36.0 g/dL Final   RDW 57/32/2025 13.0  11.5 - 15.5 % Final   Platelets 01/21/2023 215  150 - 400 K/uL Final   nRBC 01/21/2023 0.0  0.0 - 0.2 % Final   Performed at [facility], [address] [place], [place] [address]   Troponin I (High Sensitivity) 01/21/2023 5  <18 ng/L Final   Comment: (NOTE) Elevated high sensitivity troponin I (hsTnI) values and significant  changes across serial measurements may suggest ACS but many other  chronic and acute conditions are known to elevate hsTnI results.  Refer to the "Links" section for chest pain algorithms and additional  guidance. Performed at [facility], [address] [place], [place] [address]    Acetaminophen (Tylenol), Serum 01/21/2023 <10 (L)  10 - 30 ug/mL Final   Comment: (NOTE) Therapeutic concentrations vary significantly. A range of 10-30 ug/mL  may be an effective concentration for many patients. However, some  are best treated at concentrations outside of this range. Acetaminophen concentrations >150 ug/mL at 4 hours after ingestion  and >50 ug/mL at 12 hours after ingestion are often associated with  toxic reactions.  Performed at [facility], [address] [place], [place] [address]    Salicylate Lvl 01/21/2023 <7.0 (L)  7.0 - 30.0 mg/dL Final   Performed at [facility], [address] [place] [place], [place] [address]  Alcohol, Ethyl (B) 01/21/2023 <10  <10 mg/dL Final   Comment: (NOTE) Lowest detectable limit for serum alcohol is 10 mg/dL.  For medical purposes only. Performed at [facility],  [address] [place], [place] [address]   Video Visit on 01/20/2023  Component Date Value Ref Range Status   WBC 01/20/2023 4.9  3.4 - 10.8 x10E3/uL Final   RBC 01/20/2023 5.15  4.14 - 5.80 x10E6/uL Final   Hemoglobin 01/20/2023 15.9  13.0 - 17.7 g/dL Final   Hematocrit 84/69/6295 48.0  37.5 - 51.0 % Final   MCV 01/20/2023 93  79 - 97 fL Final   MCH 01/20/2023 30.9  26.6 - 33.0 pg Final   MCHC 01/20/2023 33.1  31.5 - 35.7 g/dL Final   RDW 28/41/3244 13.4  11.6 - 15.4 % Final   Platelets 01/20/2023 244  150 - 450 x10E3/uL Final   Neutrophils 01/20/2023 72  Not Estab. % Final   Lymphs 01/20/2023 11  Not Estab. % Final   Monocytes 01/20/2023 13  Not Estab. % Final   Eos 01/20/2023 3  Not Estab. % Final   Basos 01/20/2023 1  Not Estab. % Final   Neutrophils Absolute 01/20/2023 3.5  1.4 - 7.0 x10E3/uL Final   Lymphocytes Absolute 01/20/2023 0.6 (L)  0.7 - 3.1 x10E3/uL Final   Monocytes Absolute 01/20/2023 0.6  0.1 - 0.9 x10E3/uL Final   EOS (ABSOLUTE) 01/20/2023 0.2  0.0 - 0.4 x10E3/uL Final   Basophils Absolute 01/20/2023 0.0  0.0 - 0.2 x10E3/uL Final   Immature Granulocytes 01/20/2023 0  Not Estab. % Final   Immature Grans (Abs) 01/20/2023 0.0  0.0 - 0.1 x10E3/uL Final   Glucose 01/20/2023 98  70 - 99 mg/dL Final   BUN 03/25/7251 10  6 - 20 mg/dL Final   Creatinine, Ser 01/20/2023 1.28 (H)  0.76 - 1.27 mg/dL Final   eGFR 66/44/0347 78  >59 mL/min/1.73 Final   BUN/Creatinine Ratio 01/20/2023 8 (L)  9 - 20 Final   Sodium 01/20/2023 139  134 - 144 mmol/L Final   Potassium 01/20/2023 4.6  3.5 - 5.2 mmol/L Final   Chloride 01/20/2023 100  96 - 106 mmol/L Final   CO2 01/20/2023 23  20 - 29 mmol/L Final   Calcium 01/20/2023 10.3 (H)  8.7 - 10.2 mg/dL Final   Total Protein 42/59/5638 7.8  6.0 - 8.5 g/dL Final   Albumin 75/64/3329 5.0  4.3 - 5.2 g/dL Final   Globulin, Total 01/20/2023 2.8  1.5 - 4.5 g/dL Final   Bilirubin Total 01/20/2023 0.5  0.0 - 1.2 mg/dL Final   Alkaline  Phosphatase 01/20/2023 86  44 - 121 IU/L Final   AST 01/20/2023 27  0 - 40 IU/L Final   ALT 01/20/2023 25  0 - 44 IU/L Final   Amphetamines, Urine 01/20/2023 Negative  Cutoff=1000 ng/mL Final   Amphetamine test includes Amphetamine and Methamphetamine.   Barbiturate Quant, Ur 01/20/2023 Negative  Cutoff=300 ng/mL Final   Benzodiazepine Quant, Ur 01/20/2023 Negative  Cutoff=300 ng/mL Final   Cannabinoid Quant, Ur 01/20/2023 Negative  Cutoff=50 ng/mL Final   Cocaine (Metab.) 01/20/2023 Negative  Cutoff=300 ng/mL Final   Opiate Quant, Ur 01/20/2023 Negative  Cutoff=300 ng/mL Final   Opiate test includes Codeine and Morphine only.   PCP Quant, Ur 01/20/2023 Negative  Cutoff=25 ng/mL Final   Cholesterol, Total 01/20/2023 194  100 - 199 mg/dL Final   Triglycerides 51/88/4166 123  0 - 149 mg/dL Final   HDL 09/20/1599  92  >39 mg/dL Final   VLDL Cholesterol Cal 01/20/2023 21  5 - 40 mg/dL Final   LDL Chol Calc (NIH) 01/20/2023 81  0 - 99 mg/dL Final   Chol/HDL Ratio 01/20/2023 2.1  0.0 - 5.0 ratio Final   Comment:                                   T. Chol/HDL Ratio                                             Men  Women                               1/2 Avg.Risk  3.4    3.3                                   Avg.Risk  5.0    4.4                                2X Avg.Risk  9.6    7.1                                3X Avg.Risk 23.4   11.0    Prolactin 01/20/2023 20.2  3.6 - 31.5 ng/mL Final   Hgb A1c MFr Bld 01/20/2023 5.6  4.8 - 5.6 % Final   Comment:          Prediabetes: 5.7 - 6.4          Diabetes: >6.4          Glycemic control for adults with diabetes: <7.0    Est. average glucose Bld gHb Est-m* 01/20/2023 114  mg/dL Final   Bilirubin, Direct 01/20/2023 0.17  0.00 - 0.40 mg/dL Final   TSH 11/91/4782 1.700  0.450 - 4.500 uIU/mL Final   T4, Total 01/20/2023 8.6  4.5 - 12.0 ug/dL Final   T3 Uptake Ratio 01/20/2023 27  24 - 39 % Final   Free Thyroxine Index 01/20/2023 2.3  1.2 - 4.9 Final   Abstract on 12/17/2022  Component Date Value Ref Range Status   HM HIV Screening 12/08/2022 Negative - Validated   Final    Blood Alcohol level:  Lab Results  Component Value Date   ETH <10 01/21/2023   ETH <10 09/06/2021    Metabolic Disorder Labs: Lab Results  Component Value Date   HGBA1C 5.6 01/20/2023   MPG 102.54 09/06/2021   MPG 108.28 02/10/2020   Lab Results  Component Value Date   PROLACTIN 20.2 01/20/2023   PROLACTIN 3.2 (L) 09/08/2021   Lab Results  Component Value Date   CHOL 194 01/20/2023   TRIG 123 01/20/2023   HDL 92 01/20/2023   CHOLHDL 2.1 01/20/2023   VLDL 10 09/08/2021   LDLCALC 81 01/20/2023   LDLCALC 99 09/08/2021    Therapeutic Lab Levels: No results found for: "LITHIUM" No results found for: "VALPROATE" No results found for: "CBMZ"  Physical Findings   AIMS    Flowsheet Row Clinical Support from  02/16/2023 in [facility] Office Visit from 10/20/2022 in [facility] Office Visit from 06/18/2022 in [facility] Ad[facility] (Discharged[facility]  AIMS Total Score 0 1 0 2      AUDIT    Flowsheet Row Video Visit from 01/20/2023 in [facility] Admission (Discharged) [facility]  Alcohol Use Disorder Identification Test Final Score (AUDIT) 12 4      GAD-7    Flowsheet Row Clinical Support from 02/16/2023 in [facility] Video Visit from 01/20/2023 in [facility] Clinical Support from 12/08/2022 in [facility] Office Visit from 10/20/2022 in [facility] Office Visit from 06/18/2022 in [facility]  Total GAD-7 Score 0 18 1 5 1       PHQ2-9    Flowsheet Row Clinical Support from  02/16/2023 in [facility] Video Visit from 01/20/2023 in [facility] Clinical Support from 12/08/2022 in [facility] Office Visit from 10/20/2022 in [facility] Office Visit from 06/18/2022 in [facility]  PHQ-2 Total Score 0 2 1 2 3   PHQ-9 Total Score 0 13 2 4 4       Flowsheet Row ED from 06/09/2023 in [facility] ED from 01/20/2023 in [facility] at [facility] Visit from 06/18/2022 in [facility]  C-SSRS RISK CATEGORY No Risk No Risk No Risk        Musculoskeletal  Strength & Muscle Tone: within normal limits Gait & Station: normal Patient leans: N/A  Psychiatric Specialty Exam  Presentation  General Appearance:  Casual  Eye Contact: Fair  Speech: Clear and Coherent  Speech Volume: Normal  Handedness: Right   Mood and Affect  Mood: Anxious; Depressed  Affect: Congruent   Thought Process  Thought Processes: Coherent  Descriptions of Associations:Intact  Orientation:Full (Time, Place and Person)  Thought Content:Logical  Diagnosis of Schizophrenia or Schizoaffective disorder in past: Yes  Duration of Psychotic Symptoms: Greater than six months   Hallucinations:Hallucinations: Auditory  Ideas of Reference:Paranoia; Percusatory  Suicidal Thoughts:Suicidal Thoughts: No  Homicidal Thoughts:Homicidal Thoughts: No HI Active Intent and/or Plan: With Plan; With Intent   Sensorium  Memory: Immediate Poor; Recent Poor  Judgment: Poor  Insight: Poor   Executive Functions  Concentration: Fair  Attention Span: Fair  Recall: [employer] of Knowledge: Fair  Language: Fair   Psychomotor Activity  Psychomotor Activity: Psychomotor Activity: Normal   Assets  Assets: Resilience   Sleep  Sleep: Sleep:  Fair   No data recorded  Physical Exam  Physical Exam Constitutional:      Appearance: Normal appearance.  Musculoskeletal:     Cervical back: Normal range of motion.  Neurological:     General: No focal deficit present.     Mental Status: He is alert.    Review of Systems  Psychiatric/Behavioral:  Positive for depression, hallucinations and substance abuse. Negative for memory loss and suicidal ideas. The patient is nervous/anxious and has insomnia.   All other systems reviewed and are negative.  Blood pressure 99/84, pulse 86, temperature 97.7 F (36.5 C), temperature source Oral, resp. rate 14, SpO2 97%. There is no height or weight on file to calculate BMI.  Treatment  Plan Summary: Based on my evaluation the patient continues to have an emergency mental health condition for which I recommend the patient be referred to an inpatient behavioral health unit for treatment and stabilization.   -Ordered baseline labs including TSH, hemoglobin A1c, lipid panel, vitamin D, B12, TSH, EKG, urinalysis. -Continue agitation protocol medications: Haldol/Benadryl/Ativan TID PRN -Ordered Risperdal 1 mg twice daily for psychosis -Continue Cogentin 0.5 mg twice daily for EPS prophylaxis. -Continue hydroxyzine 25 mg 3 times daily as needed for anxiety. -Continue trazodone 50 mg nightly as needed for sleep  [name], NP 06/10/2023 10:16 AM

## 2023-06-10 NOTE — ED Notes (Signed)
 Patient is restless and reports needing something to help him sleep. Trazodone 50mg  PO and Hydroxyzine 25mg  PO given. Staff will continue to monitor for safety and for changes in condition.

## 2023-06-10 NOTE — ED Notes (Signed)
 Patient taking a shower.

## 2023-06-10 NOTE — ED Notes (Signed)
 Patient observed going up to other people's beds wanting to talk to them making other patient's irritated. Patient redirected and moved into flex area. Patient cooperative and redirectable at this time. Labs collected and patient provided with a drink. Patient visibly preoccupied. Patient denies SI,HI, but does endorse visual/auditory hallucinations.Patient states "I see people's faces move and transform." Patient also states he hears positive voices telling him "its all going to be okay." Denies command hallucinations. No aggressive behavior and patient calmly sitting in flex area at this time.

## 2023-06-10 NOTE — ED Notes (Signed)
 Patient is resting with eyes closed without any distress noted. Staff will continue to monitor safety and for changes in his conditions.

## 2023-06-10 NOTE — ED Notes (Signed)
 [facility] paged to give report on pt.

## 2023-06-10 NOTE — Progress Notes (Signed)
 Pt has been accepted to [facility] TODAY 06/10/2023 pending (items) faxed to [phone]. Bed assignment: Main campus  Pt meets inpatient criteria per: Janeann Merl NP  Attending Physician will be Loni Beckwith, MD  Report can be called to: [phone] (this is a pager, please leave call-back number when giving report)  Pt can arrive ASAP   Care Team Notified:  Janeann Merl NP, Fonnie Jarvis RN  [place] Tiare Rohlman LCSW-A   06/10/2023 12:46 PM

## 2023-06-10 NOTE — ED Notes (Signed)
 Sheriff dept called to transport pt to [facility] on 06/11/2023

## 2023-06-10 NOTE — ED Notes (Signed)
 Sheriff unable to transport pt tonight. States they will transport in the morning. [facility] and provider made aware

## 2023-06-10 NOTE — Progress Notes (Signed)
 LCSW Progress Note  [id number]   Larry Simmons  06/10/2023  12:36 PM  Description:   Inpatient Psychiatric Referral  Patient was recommended inpatient per [name] NP There are no available beds at [facility], per [facility] [name] RN. Patient was referred to the following out of network facilities:   Destination  Service Provider Address Phone Fax  [facility] [address] [place] [address] [phone]  [facility] [address] [place], [place] [address] [phone]  [facility] [address] [place] [address] [phone]  [facility] [place] [address], [place] [address] [phone]  [facility] [address] [place] [address] [phone]  [facility] EFAX [address] [place] [phone]  [facility] [address] [place] [address] [phone]  [facility] [address] [place] [address] [phone]      Situation ongoing, CSW to continue following and update chart as more information becomes available.      [name] , MSW, LCSW  06/10/2023 12:36 PM

## 2023-06-10 NOTE — ED Notes (Signed)
 Patient is seen talking on the phone without any distress. He is calm and cooperative and denies SIHI, AVH, and anxiety and depression. He is wearing scrubs and no abnormal/aggressive behaviors observed or reported. Staff will continue to monitor for safety and for changes in condition.

## 2023-06-11 NOTE — ED Notes (Signed)
 Patient is resting with eyes closed without any distress noted. Staff will continue to monitor for safety and for changes in condition.

## 2023-06-11 NOTE — ED Notes (Signed)
 The patient is using the secure telephone on the unit. No acute distress noted. Environment is secured. Will continue to monitor for safety.

## 2023-06-11 NOTE — ED Provider Notes (Signed)
 FBC/OBS ASAP Discharge Summary  Date and Time: 06/11/2023 9:06 AM  Name: Larry Simmons  MRN:  [id number]   Discharge Diagnoses:  Final diagnoses:  Schizophrenia, paranoid [facility])    Subjective: Larry Simmons 33 y.o., male patient presented to [facility] under involuntary commitment on 06/09/23 accompanied by GPD with complaints of threats to harm ex-fiance and psychosis. PPH of Schizophrenia. Per chart review, received last LAI of Invega Sustenna 156mg  on 04/22/2023.  Larry Simmons, is seen face to face by this provider, consulted with Dr. Enedina Finner; and chart reviewed on 06/11/23.     Upon evaluation Larry Simmons is sitting up in bed, folding a piece of paper, in no acute distress.  He is alert & oriented x 4, calm, cooperative but distracted for this assessment.  His mood is labile but depressed at this moment with congruent affect.  He has blocked speech, and normal behavior.  Objectively there is evidence of psychosis present as observed by paranoia, hyper-religious delusions and preoccupation. Pt does appear to be responding to internal stimuli.  Pt requires redirection in assessment as he is fixated on being "viewed as a good person, I am not a bad person, I hope you all remember that. I feel righteous and I am a good demon that wants to help people".  He denies suicidal and homicidal ideations as well as hallucinations. He did report to nursing staff that he doesn't want to kill his ex-fiance anymore but wants her to die "of natural causes". Discussed plan for pt to be transported to [facility] for inpatient treatment which he is aware of.   Stay Summary: 06/09/2023   [name], NP                      During encounter with patient he states "I am not crazy or nothing.  I don't need the medicine. I'll rather have weed. God got me. God got all of [place]. God is my father, and I am sweet baby Jesus. I'll die for your sins. I was going to kill my ex-fiance today. She's a Ambulance person. She  haunts me." Mood is labile, patient perseverates predominantly with delusions of grandiose, and is religiously preoccupied.  He goes from talking calmly, to singing, and to crying.  He presents with auditory hallucinations, states that he can hear his fiance, states that he had homicidal ideations toward his fiance and had a plan to strangle her, but when he got to her home, the door was locked.  Patient states that he has done everything in the wall, everything that is evil, but has not killed anyone yet, but has killed an animal.  At one point states that he is Lucifer, talks about wanting to go to [place], and wanting to fly down from the sky and go deep diving into the sea. He talks about crawling on his legs while on [address] because he was trying to give himself to God.  Patient talks about being in [facility] house for the past 1.5 weeks, while laughing inappropriately, talks about wanting "weed".  He talks about drinking alcohol every day, and uses marijuana, & nicotine but denies any other substance use. He reports that the last time he used the above substances was 1 week ago.  Patient pauses while talking, and states "I feel y'all spirits."  He talks about wanting to cast a spell and speak in tongues. Patient is being recommended for inpatient hospitalization at this time for  treatment and stabilization of his thought disorder. We will start patient on Risperdal 1 mg twice daily and Cogentin 0.5 mg twice daily along with hydroxyzine 25 mg as needed and trazodone 50 mg nightly as needed for sleep.   Total Time spent with patient: 20 minutes  Past Psychiatric History:Schizophrenia Past Medical History: None reported Family History: None reported Family Psychiatric History: Mother- Schizophrenia Social History: Pt is currently single and endorses THC use. Currently [facility] at the [facility]. Has an ex-finance, who he made plans to kill prior to being IVC'd.   Tobacco Cessation:  N/A, patient does  not currently use tobacco products  Current Medications:  Current Facility-Administered Medications  Medication Dose Route Frequency Provider Last Rate Last Admin   acetaminophen (TYLENOL) tablet 650 mg  650 mg Oral Q6H PRN Nkwenti, Doris, NP       alum & mag hydroxide-simeth (MAALOX/MYLANTA) 200-200-20 MG/5ML suspension 30 mL  30 mL Oral Q4H PRN [name], NP       benztropine (COGENTIN) tablet 0.5 mg  0.5 mg Oral BID [name], NP   0.5 mg at 06/10/23 2127   haloperidol (HALDOL) tablet 5 mg  5 mg Oral TID PRN [name], NP       And   diphenhydrAMINE (BENADRYL) capsule 50 mg  50 mg Oral TID PRN [name], NP       haloperidol lactate (HALDOL) injection 10 mg  10 mg Intramuscular TID PRN [name], NP   10 mg at 06/09/23 2056   And   diphenhydrAMINE (BENADRYL) injection 50 mg  50 mg Intramuscular TID PRN [name], NP       And   LORazepam (ATIVAN) injection 2 mg  2 mg Intramuscular TID PRN [name], NP       diphenhydrAMINE (BENADRYL) injection 50 mg  50 mg Intramuscular TID PRN [name], NP   50 mg at 06/09/23 2057   And   LORazepam (ATIVAN) injection 2 mg  2 mg Intramuscular TID PRN [name], NP   2 mg at 06/09/23 2056   hydrOXYzine (ATARAX) tablet 25 mg  25 mg Oral TID PRN [name], NP   25 mg at 06/10/23 2312   magnesium hydroxide (MILK OF MAGNESIA) suspension 30 mL  30 mL Oral Daily PRN [name], NP       risperiDONE (RISPERDAL M-TABS) disintegrating tablet 1 mg  1 mg Oral BID [name], NP   1 mg at 06/10/23 2127   traZODone (DESYREL) tablet 50 mg  50 mg Oral QHS PRN [name], NP   50 mg at 06/10/23 2312   Current Outpatient Medications  Medication Sig Dispense Refill   benztropine (COGENTIN) 0.5 MG tablet Take 1 tablet (0.5 mg total) by mouth 2 (two) times daily.     hydrOXYzine (ATARAX) 25 MG tablet Take 1 tablet (25 mg total) by mouth 3 (three) times daily as needed for anxiety.     risperiDONE (RISPERDAL  M-TABS) 1 MG disintegrating tablet Take 1 tablet (1 mg total) by mouth 2 (two) times daily.     traZODone (DESYREL) 50 MG tablet Take 1 tablet (50 mg total) by mouth at bedtime as needed for sleep.      PTA Medications:  Facility Ordered Medications  Medication   acetaminophen (TYLENOL) tablet 650 mg   alum & mag hydroxide-simeth (MAALOX/MYLANTA) 200-200-20 MG/5ML suspension 30 mL   magnesium hydroxide (MILK OF MAGNESIA) suspension 30 mL   haloperidol lactate (HALDOL) injection 10 mg  And   diphenhydrAMINE (BENADRYL) injection 50 mg   And   LORazepam (ATIVAN) injection 2 mg   hydrOXYzine (ATARAX) tablet 25 mg   risperiDONE (RISPERDAL M-TABS) disintegrating tablet 1 mg   haloperidol (HALDOL) tablet 5 mg   And   diphenhydrAMINE (BENADRYL) capsule 50 mg   diphenhydrAMINE (BENADRYL) injection 50 mg   And   LORazepam (ATIVAN) injection 2 mg   benztropine (COGENTIN) tablet 0.5 mg   traZODone (DESYREL) tablet 50 mg   PTA Medications  Medication Sig   benztropine (COGENTIN) 0.5 MG tablet Take 1 tablet (0.5 mg total) by mouth 2 (two) times daily.   hydrOXYzine (ATARAX) 25 MG tablet Take 1 tablet (25 mg total) by mouth 3 (three) times daily as needed for anxiety.   risperiDONE (RISPERDAL M-TABS) 1 MG disintegrating tablet Take 1 tablet (1 mg total) by mouth 2 (two) times daily.   traZODone (DESYREL) 50 MG tablet Take 1 tablet (50 mg total) by mouth at bedtime as needed for sleep.       02/16/2023    1:07 PM 01/20/2023    9:16 AM 12/08/2022    3:47 PM  Depression screen PHQ 2/9  Decreased Interest 0 0 0  Down, Depressed, Hopeless 0 2 1  PHQ - 2 Score 0 2 1  Altered sleeping 0 3 0  Tired, decreased energy 0 0 0  Change in appetite 0 2 0  Feeling bad or failure about yourself  0 2 1  Trouble concentrating 0 2 0  Moving slowly or fidgety/restless 0 2 0  Suicidal thoughts 0 0 0  PHQ-9 Score 0 13 2  Difficult doing work/chores Not difficult at all Somewhat difficult Not difficult  at all    [facility] ED from 06/09/2023 in [facility] ED from 01/20/2023 in [facility] Emergency Department at [facility] Visit from 06/18/2022 in [facility]  C-SSRS RISK CATEGORY No Risk No Risk No Risk       Musculoskeletal  Strength & Muscle Tone: within normal limits Gait & Station: normal Patient leans: N/A  Psychiatric Specialty Exam  Presentation  General Appearance:  Disheveled  Eye Contact: Fair  Speech: Blocked  Speech Volume: Normal  Handedness: Right   Mood and Affect  Mood: Anxious; Labile  Affect: Flat   Thought Process  Thought Processes: Disorganized  Descriptions of Associations:Circumstantial  Orientation:Full (Time, Place and Person)  Thought Content:Delusions; Scattered  Diagnosis of Schizophrenia or Schizoaffective disorder in past: Yes  Duration of Psychotic Symptoms: Greater than six months   Hallucinations:Hallucinations: Auditory  Ideas of Reference:Percusatory; Paranoia; Delusions  Suicidal Thoughts:Suicidal Thoughts: No  Homicidal Thoughts:Homicidal Thoughts: No   Sensorium  Memory: Immediate Fair; Recent Fair  Judgment: Poor  Insight: Fair   Chartered certified accountant: Fair  Attention Span: Fair  Recall: Fair  Fund of Knowledge: Fair  Language: Fair   Psychomotor Activity  Psychomotor Activity: Psychomotor Activity: Restlessness   Assets  Assets: Desire for Improvement; Housing; Physical Health; Resilience   Sleep  Sleep: Sleep: Fair Number of Hours of Sleep: 6   No data recorded  Physical Exam  Physical Exam HENT:     Head: Normocephalic.     Nose: Nose normal.  Eyes:     Extraocular Movements: Extraocular movements intact.  Cardiovascular:     Rate and Rhythm: Normal rate.  Pulmonary:     Effort: Pulmonary effort is normal.  Musculoskeletal:        General:  Normal range of motion.      Cervical back: Normal range of motion.  Skin:    General: Skin is dry.  Neurological:     General: No focal deficit present.     Mental Status: He is alert and oriented to person, place, and time.   Review of Systems  Constitutional: Negative.   HENT: Negative.    Eyes: Negative.   Respiratory: Negative.    Cardiovascular: Negative.   Gastrointestinal: Negative.   Genitourinary:  Positive for flank pain.  Skin: Negative.   Neurological: Negative.   Endo/Heme/Allergies: Negative.   Psychiatric/Behavioral:  Positive for hallucinations. The patient is nervous/anxious.    Blood pressure 121/87, pulse 67, temperature 97.8 F (36.6 C), temperature source Oral, resp. rate 18, SpO2 99%. There is no height or weight on file to calculate BMI.   Plan Of Care/Follow-up recommendations:  Pt is recommended for inpatient hospitalization for safety and psychosis. Pt remains under involuntary commitment order. Pt was accepted to [facility], awaiting transport.   Disposition: [facility]  Howie Ill, NP 06/11/2023, 9:06 AM

## 2023-06-11 NOTE — ED Notes (Signed)
 Patient A&Ox4. Denies intent/thoughts to harm self/others when asked. Denies A/VH. Patient denies any physical complaints when asked. No acute distress noted. 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 Support and encouragement provided. Routine safety checks conducted according to facility protocol. Encouraged patient to notify staff if thoughts of harm toward self or others arise. Endorses safety. Patient verbalized understanding and agreement. Will continue to monitor for safety.

## 2023-06-11 NOTE — Discharge Instructions (Signed)
 Pt transferred to [facility]

## 2023-07-22 ENCOUNTER — Encounter (HOSPITAL_COMMUNITY): Payer: Self-pay | Admitting: Psychiatry

## 2023-07-22 ENCOUNTER — Ambulatory Visit (HOSPITAL_COMMUNITY): Payer: MEDICAID | Admitting: Psychiatry

## 2023-07-22 VITALS — BP 125/68 | HR 56 | Temp 97.7°F | Wt 199.4 lb

## 2023-07-22 DIAGNOSIS — F200 Paranoid schizophrenia: Secondary | ICD-10-CM | POA: Diagnosis not present

## 2023-07-22 MED ORDER — HALOPERIDOL 10 MG PO TABS: 10.0000 mg | ORAL_TABLET | Freq: Two times a day (BID) | ORAL | 3 refills | Status: DC

## 2023-07-22 MED ORDER — INVEGA SUSTENNA 156 MG/ML IM SUSY: 156.0000 mg | PREFILLED_SYRINGE | INTRAMUSCULAR | 11 refills | Status: DC

## 2023-07-22 MED ORDER — BENZTROPINE MESYLATE 0.5 MG PO TABS: 0.5000 mg | ORAL_TABLET | Freq: Two times a day (BID) | ORAL | Status: DC

## 2023-07-22 MED ORDER — PALIPERIDONE PALMITATE ER 156 MG/ML IM SUSY
156.0000 mg | PREFILLED_SYRINGE | Freq: Once | INTRAMUSCULAR | Status: AC
  Administered 2023-08-03: 156 mg via INTRAMUSCULAR

## 2023-07-22 MED ORDER — TRAZODONE HCL 50 MG PO TABS
50.0000 mg | ORAL_TABLET | Freq: Every evening | ORAL | 3 refills | Status: DC | PRN
Start: 2023-07-22 — End: 2023-07-27

## 2023-07-22 MED ORDER — BENZTROPINE MESYLATE 0.5 MG PO TABS: 0.5000 mg | ORAL_TABLET | Freq: Two times a day (BID) | ORAL | 3 refills | Status: DC

## 2023-07-22 MED ORDER — TRAZODONE HCL 50 MG PO TABS: 50.0000 mg | ORAL_TABLET | Freq: Every evening | ORAL | Status: DC | PRN

## 2023-07-22 MED ORDER — DIVALPROEX SODIUM 500 MG PO DR TAB: 500.0000 mg | DELAYED_RELEASE_TABLET | Freq: Two times a day (BID) | ORAL | 3 refills | Status: DC

## 2023-07-22 NOTE — Progress Notes (Signed)
 BH MD/PA/NP OP Progress Note    07/22/2023 2:07 PM Larry Simmons  MRN:  [id number]  Chief Complaint: "I am doing okay"   HPI: 33 year old male seen today for follow up psychiatric history of marijuana use,schizophrenia and depression.  Patient presented to [facility] on 06/09/2023-06/11/2023. Per chart review he was  accompanied by [employer] with complaints of threats to harm ex-fiance and psychosis.  Patient was then transferred to [facility] regional from 06/11/2023-06/27/2023. His medications were adjusted and he is currently managed on Depakote  500 mg twice daily, Haldol  10 mg, trazodone  50 mg nightly as needed, cogentin  0.5 mg twice daily, and Invega  234 mg monthly.  Provider unaware when patient received his last Invega  injection.  Provider requested records from [facility].  Today he notes that his medications are effective for managing his psychiatric conditions.     Today patient is well groomed, pleasant, cooperative, and engaged in conversation. He informed Clinical research associate that since his hospital discharge has been doing okay.  He informed Clinical research associate that he is enrolled at [facility] house to maintain his sobriety from marijuana.  He also notes that he is more dedicated to continuing his psychiatric medications.  Provider informed patient that every time he has a psychiatric decline from due to not taking medication he can cause increased damage.  He endorsed understanding and notes that he will try to make more compliant.  Patient notes that he has been trying to stay positive.  He no longer is in contact with his ex-girlfriend.  He does have the support of his mother and his siblings.  Patient informed Clinical research associate that his father passed away in 2024/03/14 but notes that he is learning to cope with this.   Overall he notes that his mood is stable he reports that he has minimal anxiety and depression.    Today provider conducted a GAD-7 and patient scored a 1, at his last visit he scored a 0. Provider also conducted PHQ-9 and  patient scored a 3, at his last visit he scored a 0.  He endorses adequate sleep and reduced appetite.  Patient reports that he has lost 6 pounds since his last visit today he denies SI/HI/VAH or mania.    Patient informed Clinical research associate that he needs his not used marijuana or engage in alcohol since being admitted to the [facility] house.  He reports that although he does not like everything about [name] he is determined to complete his course as he sees the benefits of sobriety.  Provider conducted an [place] assessment today and patient scored a 0.  At this time no medication changes made.  Provided fax over release of information to [facility] to have patient's medical records transferred.  Provider unaware when patient received his last Invega  injection.  Once records are reviewed patient's injection will be scheduled.  Provider spoke to patient's sister who believes that she has these documents as well.  Once documents are received they will be scanned into patient's medical records. No other concerns noted at this time.   Visit Diagnosis:    ICD-10-CM   1. Schizophrenia, paranoid (HCC)  F20.0 divalproex  (DEPAKOTE ) 500 MG DR tablet    haloperidol  (HALDOL ) 10 MG tablet    benztropine  (COGENTIN ) 0.5 MG tablet    traZODone  (DESYREL ) 50 MG tablet    paliperidone  (INVEGA  SUSTENNA) injection 156 mg    paliperidone  (INVEGA  SUSTENNA) 156 MG/ML SUSY injection            Past Psychiatric History:  Schizophrenia and depression  Past Medical History:  Past Medical History:  Diagnosis Date   Asthma    Eczema    History reviewed. No pertinent surgical history.  Family Psychiatric History: Mother- Schizophrenia   Family History: History reviewed. No pertinent family history.  Social History:  Social History   Socioeconomic History   Marital status: Single    Spouse name: Not on file   Number of children: 0   Years of education: Not on file   Highest education level: High school graduate   Occupational History   Occupation: Social research officer, government     Comment: Museum/gallery curator  Tobacco Use   Smoking status: Every Day    Current packs/day: 0.50    Average packs/day: 0.5 packs/day for 13.4 years (6.7 ttl pk-yrs)    Types: Cigarettes    Start date: 03/02/2010   Smokeless tobacco: Never   Tobacco comments:    Patient currently using nicotene gum/patches to help quit.   Substance and Sexual Activity   Alcohol use: Yes    Alcohol/week: 4.0 standard drinks of alcohol    Types: 4 Cans of beer per week    Comment: occassionl   Drug use: Not Currently    Types: Marijuana    Comment: Last used 04/2022   Sexual activity: Not Currently    Comment: Last sexual encounter 04/2022  Other Topics Concern   Not on file  Social History Narrative   Not on file   Social Drivers of Health   Financial Resource Strain: Not on file  Food Insecurity: Food Insecurity Present (06/09/2023)   Hunger Vital Sign    Worried About Running Out of Food in the Last Year: Sometimes true    Ran Out of Food in the Last Year: Sometimes true  Transportation Needs: No Transportation Needs (06/09/2023)   PRAPARE - Administrator, Civil Service (Medical): No    Lack of Transportation (Non-Medical): No  Physical Activity: Not on file  Stress: Not on file  Social Connections: Not on file    Allergies:  Allergies  Allergen Reactions   Iodinated Contrast Media Other (See Comments)    Irritated skin   Shellfish Allergy Swelling   Peanut-Containing Drug Products Swelling    Metabolic Disorder Labs: Lab Results  Component Value Date   HGBA1C 5.3 06/10/2023   MPG 105.41 06/10/2023   MPG 102.54 09/06/2021   Lab Results  Component Value Date   PROLACTIN 20.2 01/20/2023   PROLACTIN 3.2 (L) 09/08/2021   Lab Results  Component Value Date   CHOL 161 06/10/2023   TRIG 31 06/10/2023   HDL 81 06/10/2023   CHOLHDL 2.0 06/10/2023   VLDL 6 06/10/2023   LDLCALC 74 06/10/2023   LDLCALC 81  01/20/2023   Lab Results  Component Value Date   TSH 1.783 06/10/2023   TSH 1.700 01/20/2023    Therapeutic Level Labs: No results found for: "LITHIUM" No results found for: "VALPROATE" No results found for: "CBMZ"  Current Medications: Current Outpatient Medications  Medication Sig Dispense Refill   divalproex  (DEPAKOTE ) 500 MG DR tablet Take 1 tablet (500 mg total) by mouth 2 (two) times daily. 60 tablet 3   haloperidol  (HALDOL ) 10 MG tablet Take 1 tablet (10 mg total) by mouth 2 (two) times daily. 30 tablet 3   paliperidone  (INVEGA  SUSTENNA) 156 MG/ML SUSY injection Inject 1 mL (156 mg total) into the muscle every 28 (twenty-eight) days. 1 mL 11   benztropine  (COGENTIN ) 0.5 MG tablet  Take 1 tablet (0.5 mg total) by mouth 2 (two) times daily. 60 tablet 3   traZODone  (DESYREL ) 50 MG tablet Take 1 tablet (50 mg total) by mouth at bedtime as needed for sleep. 30 tablet 3   Current Facility-Administered Medications  Medication Dose Route Frequency Provider Last Rate Last Admin   paliperidone  (INVEGA  SUSTENNA) injection 156 mg  156 mg Intramuscular Once          Musculoskeletal: Strength & Muscle Tone: within normal limits Gait & Station: normal Patient leans: N/A  Psychiatric Specialty Exam: Review of Systems  Blood pressure 125/68, pulse (!) 56, temperature 97.7 F (36.5 C), weight 199 lb 6.4 oz (90.4 kg), SpO2 100%.Body mass index is 26.31 kg/m.  General Appearance: Well Groomed  Eye Contact:  Good  Speech:  Clear and Coherent and Normal Rate  Volume:  Normal  Mood:  Euthymic  Affect:  Appropriate and Congruent  Thought Process:  Coherent, Goal Directed, and Linear  Orientation:  Full (Time, Place, and Person)  Thought Content: WDL and Logical   Suicidal Thoughts:  No  Homicidal Thoughts:  No  Memory:  Immediate;   Good Recent;   Good Remote;   Good  Judgement:  Good  Insight:  Good  Psychomotor Activity:  Normal  Concentration:  Concentration: Good and  Attention Span: Good  Recall:  Good  Fund of Knowledge: Good  Language: Good  Akathisia:  No  Handed:  Right  AIMS (if indicated):  done, WNL, 0  Assets:  Communication Skills Desire for Improvement Financial Resources/Insurance Housing Intimacy Leisure Time Physical Health Social Support  ADL's:  Intact  Cognition: WNL  Sleep:  Good   Screenings: AIMS    Flowsheet Row Clinical Support from 02/16/2023 in [facility] Office Visit from 10/20/2022 in [facility] Office Visit from 06/18/2022 in [facility] (Discharged[facility]  AIMS Total Score 0 1 0 2      AUDIT    Flowsheet Row Video Visit from 01/20/2023 in [facility] Admission (Discharged) from [facility]  Alcohol Use Disorder Identification Test Final Score (AUDIT) 12 4      GAD-7    Flowsheet Row Clinical Support from 07/22/2023 in [facility] Clinical Support from 02/16/2023 in [facility] Video Visit from 01/20/2023 in [facility] Clinical Support from 12/08/2022 in [facility] Office Visit from 10/20/2022 in [facility]  Total GAD-7 Score 1 0 18 1 5       PHQ2-9    Flowsheet Row Clinical Support from 07/22/2023 in [facility] Clinical Support from 02/16/2023 in [facility] Video Visit from 01/20/2023 in [facility] Clinical Support from 12/08/2022 in [facility] Office Visit from 10/20/2022 in [facility]  PHQ-2 Total Score 0 0 2 1 2   PHQ-9 Total Score 3 0 13 2 4       Flowsheet Row ED from 06/09/2023 in [facility] ED from 01/20/2023 in [facility] Emergency Department at [facility] Visit from 06/18/2022 in [facility]  C-SSRS RISK CATEGORY No Risk No Risk No Risk        Assessment and Plan: Patient notes that he is doing  well since his hospitalization.  Provided fax over release of information to [facility] to have patient's medical records transferred.  Provider unaware when patient received his last Invega  injection.  Once records are reviewed patient's injection will be scheduled.  Provider spoke to patient's sister who believes that she has these documents as well.  Once documents are received they will be scanned into patient's medical records.  1. Schizophrenia, paranoid (HCC) (Primary)  Continue- divalproex  (DEPAKOTE ) 500 MG DR tablet; Take 1 tablet (500 mg total) by mouth 2 (two) times daily.  Dispense: 60 tablet; Refill: 3 Continue- haloperidol  (HALDOL ) 10 MG tablet; Take 1 tablet (10 mg total) by mouth 2 (two) times daily.  Dispense: 30 tablet; Refill: 3 Continue- benztropine  (COGENTIN ) 0.5 MG tablet; Take 1 tablet (0.5 mg total) by mouth 2 (two) times daily.  Dispense: 60 tablet; Refill: 3 Continue- traZODone  (DESYREL ) 50 MG tablet; Take 1 tablet (50 mg total) by mouth at bedtime as needed for sleep.  Dispense: 30 tablet; Refill: 3 Continue- paliperidone  (INVEGA  SUSTENNA) injection 156 mg Continue- paliperidone  (INVEGA  SUSTENNA) 156 MG/ML SUSY injection; Inject 1 mL (156 mg total) into the muscle every 28 (twenty-eight) days.  Dispense: 1 mL; Refill: 11    Collaboration of Care: Collaboration of Care: Other provider involved in patient's care AEB PCP  Patient/Guardian was advised Release of Information must be obtained prior to any record release in order to collaborate their care with an outside provider. Patient/Guardian was advised if they have not already done so to contact the registration department to sign all  necessary forms in order for us  to release information regarding their care.   Consent: Patient/Guardian gives verbal consent for treatment and assignment of benefits for services provided during this visit. Patient/Guardian expressed understanding and agreed to proceed.   Follow up in 2.5 month Follow up in one week with shot clinic Arlyne Bering, NP 07/22/2023, 2:07 PM

## 2023-07-27 ENCOUNTER — Other Ambulatory Visit (HOSPITAL_COMMUNITY): Payer: Self-pay | Admitting: *Deleted

## 2023-07-27 ENCOUNTER — Other Ambulatory Visit (HOSPITAL_COMMUNITY): Payer: Self-pay | Admitting: Psychiatry

## 2023-07-27 DIAGNOSIS — F200 Paranoid schizophrenia: Secondary | ICD-10-CM

## 2023-07-27 MED ORDER — INVEGA SUSTENNA 156 MG/ML IM SUSY: 156.0000 mg | PREFILLED_SYRINGE | INTRAMUSCULAR | 11 refills | Status: DC

## 2023-07-27 MED ORDER — TRAZODONE HCL 50 MG PO TABS
50.0000 mg | ORAL_TABLET | Freq: Every evening | ORAL | 3 refills | Status: DC | PRN
Start: 2023-07-27 — End: 2023-11-18

## 2023-07-27 NOTE — Progress Notes (Signed)
 Chart opened for Dr. Melisa Spray to review medications. Verbal order given to reorder Trazodone  and Invega  while she was on the phone with [facility] house. Sent to [place] to fill medications.

## 2023-07-27 NOTE — Telephone Encounter (Signed)
 Patient was admitted to [facility] on 06/11/2023 through 06/27/2023.  Patient was discharged on Invega  234 mg to be taken every 4 weeks.  Patient informed writer that he received his last injection on 06/24/2023.  Provider sent a release of information over to [facility] however has not received patient's medical record.  Provider also reached out to patient caseworker Ms. Audrea Learned at [facility] via email and voicemail but did not get a response.  Patient presented medical records however he did not have his last injection date.  Patient's sister as well as Designer, television/film set reports that he did receive his Invega  LAI on 06/24/2023.  Patient will receive Invega  156 mg on 07/28/2023.  A sample will be provided for patient as his pharmacy does not shipped over his current meds.

## 2023-07-28 ENCOUNTER — Ambulatory Visit (HOSPITAL_COMMUNITY): Payer: MEDICAID

## 2023-07-30 ENCOUNTER — Telehealth (HOSPITAL_COMMUNITY): Payer: Self-pay

## 2023-07-30 ENCOUNTER — Ambulatory Visit (HOSPITAL_COMMUNITY)
Admission: EM | Admit: 2023-07-30 | Discharge: 2023-07-30 | Disposition: A | Discharge status: Home / Self Care | Payer: MEDICAID

## 2023-07-30 ENCOUNTER — Ambulatory Visit (INDEPENDENT_AMBULATORY_CARE_PROVIDER_SITE_OTHER): Payer: MEDICAID

## 2023-07-30 ENCOUNTER — Encounter (HOSPITAL_COMMUNITY): Payer: Self-pay

## 2023-07-30 DIAGNOSIS — R062 Wheezing: Secondary | ICD-10-CM

## 2023-07-30 DIAGNOSIS — J4521 Mild intermittent asthma with (acute) exacerbation: Secondary | ICD-10-CM | POA: Diagnosis not present

## 2023-07-30 MED ORDER — IPRATROPIUM-ALBUTEROL 0.5-2.5 (3) MG/3ML IN SOLN
RESPIRATORY_TRACT | Status: AC
  Filled 2023-07-30: qty 3

## 2023-07-30 MED ORDER — PREDNISONE 20 MG PO TABS: 40.0000 mg | ORAL_TABLET | Freq: Every day | ORAL | 0 refills | Status: AC

## 2023-07-30 MED ORDER — ALBUTEROL SULFATE HFA 108 (90 BASE) MCG/ACT IN AERS: 1.0000 | INHALATION_SPRAY | Freq: Four times a day (QID) | RESPIRATORY_TRACT | 3 refills | Status: AC | PRN

## 2023-07-30 MED ORDER — IPRATROPIUM-ALBUTEROL 0.5-2.5 (3) MG/3ML IN SOLN
3.0000 mL | Freq: Once | RESPIRATORY_TRACT | Status: AC
  Administered 2023-07-30: 3 mL via RESPIRATORY_TRACT

## 2023-07-30 NOTE — Telephone Encounter (Signed)
 Pts 10mg  haloperidol  has been approved from 07/26/2023-05/07-2024 via cover my meds. Pharmacy notified.

## 2023-07-30 NOTE — ED Triage Notes (Addendum)
 Pt states he was working and there was a lot of dust. States he feels like he inhaled some and is allergic to dust.  States since then he has been coughing,and having chest pain and SOB. States he has been taking Claritin and cough medicine at home.

## 2023-07-30 NOTE — Telephone Encounter (Signed)
 Pt will bring injection at next app. States that [facility] has his injection when speaking with [facility].   JNL

## 2023-07-30 NOTE — Discharge Instructions (Signed)
  1. Mild intermittent asthma with exacerbation (Primary) - DG Chest 2 View x-ray performed in UC shows no acute cardiopulmonary processes, no sign of consolidation or pneumonia. - ipratropium-albuterol  (DUONEB) 0.5-2.5 (3) MG/3ML nebulizer solution 3 mL given in UC for acute wheezing secondary to asthma exacerbation - albuterol  (VENTOLIN  HFA) 108 (90 Base) MCG/ACT inhaler; Inhale 1-2 puffs into the lungs every 6 (six) hours as needed for wheezing or shortness of breath.  Dispense: 6.7 g; Refill: 3 - predniSONE  (DELTASONE ) 20 MG tablet; Take 2 tablets (40 mg total) by mouth daily for 5 days.  Dispense: 10 tablet; Refill: 0 -Continue to monitor symptoms for any change in severity if there is any escalation of current symptoms or development of new symptoms follow-up in ER for further evaluation and management.

## 2023-07-30 NOTE — ED Provider Notes (Signed)
 [facility]  Note:  This document was prepared using Dragon voice recognition software and may include unintentional dictation errors.  MRN: [id number] DOB: October 25, 1990  Subjective:   Larry Simmons is a 33 y.o. male presenting for persistent cough and wheezing x 1 week.  Patient reports that symptoms began after he was at work and there was a lot of dust.  Patient reports past history of allergy to dust and childhood asthma.  Patient states that he has been coughing and having chest congestion and shortness of breath since that time.  Patient has taken over-the-counter Claritin and cough medicine at home with minimal improvement.  Patient has not used albuterol  as he does not have an inhaler any longer.  Patient denies any significant exacerbation of asthma since he was a kid.  Patient denies any shortness of breath, chest pain, weakness, dizziness at this time.   Current Facility-Administered Medications:    paliperidone  (INVEGA  SUSTENNA) injection 156 mg, 156 mg, Intramuscular, Once,   Current Outpatient Medications:    albuterol  (VENTOLIN  HFA) 108 (90 Base) MCG/ACT inhaler, Inhale 1-2 puffs into the lungs every 6 (six) hours as needed for wheezing or shortness of breath., Disp: 6.7 g, Rfl: 3   predniSONE  (DELTASONE ) 20 MG tablet, Take 2 tablets (40 mg total) by mouth daily for 5 days., Disp: 10 tablet, Rfl: 0   benztropine  (COGENTIN ) 0.5 MG tablet, Take 1 tablet (0.5 mg total) by mouth 2 (two) times daily., Disp: 60 tablet, Rfl: 3   divalproex  (DEPAKOTE ) 500 MG DR tablet, Take 1 tablet (500 mg total) by mouth 2 (two) times daily., Disp: 60 tablet, Rfl: 3   haloperidol  (HALDOL ) 10 MG tablet, Take 1 tablet (10 mg total) by mouth 2 (two) times daily., Disp: 30 tablet, Rfl: 3   paliperidone  (INVEGA  SUSTENNA) 156 MG/ML SUSY injection, Inject 1 mL (156 mg total) into the muscle every 28 (twenty-eight) days., Disp: 1 mL, Rfl: 11   traZODone  (DESYREL ) 50 MG tablet, Take 1 tablet  (50 mg total) by mouth at bedtime as needed for sleep., Disp: 30 tablet, Rfl: 3   Allergies  Allergen Reactions   Iodinated Contrast Media Other (See Comments)    Irritated skin   Shellfish Allergy Swelling   Peanut-Containing Drug Products Swelling    Past Medical History:  Diagnosis Date   Asthma    Eczema      History reviewed. No pertinent surgical history.  History reviewed. No pertinent family history.  Social History   Tobacco Use   Smoking status: Every Day    Current packs/day: 0.50    Average packs/day: 0.5 packs/day for 13.4 years (6.7 ttl pk-yrs)    Types: Cigarettes    Start date: 03/02/2010   Smokeless tobacco: Never   Tobacco comments:    Patient currently using nicotene gum/patches to help quit.   Substance Use Topics   Alcohol use: Yes    Alcohol/week: 4.0 standard drinks of alcohol    Types: 4 Cans of beer per week    Comment: occassionl   Drug use: Not Currently    Types: Marijuana    Comment: Last used 04/2022    ROS Refer to HPI for ROS details.  Objective:   Vitals: BP 102/68 (BP Location: Right Arm)   Pulse 89   Temp 97.9 F (36.6 C) (Oral)   Resp 18   SpO2 93%   Physical Exam Vitals and nursing note reviewed.  Constitutional:      General: He is not  in acute distress.    Appearance: Normal appearance. He is well-developed. He is not ill-appearing or toxic-appearing.  HENT:     Head: Normocephalic.     Nose: Nose normal.     Mouth/Throat:     Mouth: Mucous membranes are moist.     Pharynx: Oropharynx is clear.  Eyes:     General:        Right eye: No discharge.        Left eye: No discharge.     Extraocular Movements: Extraocular movements intact.     Conjunctiva/sclera: Conjunctivae normal.  Cardiovascular:     Rate and Rhythm: Normal rate and regular rhythm.     Heart sounds: Normal heart sounds. No murmur heard. Pulmonary:     Effort: Pulmonary effort is normal. No respiratory distress.     Breath sounds: No  stridor. Wheezing present. No rhonchi or rales.  Chest:     Chest wall: No tenderness.  Skin:    General: Skin is warm and dry.  Neurological:     General: No focal deficit present.     Mental Status: He is alert and oriented to person, place, and time.  Psychiatric:        Mood and Affect: Mood normal.        Behavior: Behavior normal.     Procedures  No results found for this or any previous visit (from the past 24 hours).  No results found.   Assessment and Plan :     Discharge Instructions       1. Mild intermittent asthma with exacerbation (Primary) - DG Chest 2 View x-ray performed in UC shows no acute cardiopulmonary processes, no sign of consolidation or pneumonia. - ipratropium-albuterol  (DUONEB) 0.5-2.5 (3) MG/3ML nebulizer solution 3 mL given in UC for acute wheezing secondary to asthma exacerbation - albuterol  (VENTOLIN  HFA) 108 (90 Base) MCG/ACT inhaler; Inhale 1-2 puffs into the lungs every 6 (six) hours as needed for wheezing or shortness of breath.  Dispense: 6.7 g; Refill: 3 - predniSONE  (DELTASONE ) 20 MG tablet; Take 2 tablets (40 mg total) by mouth daily for 5 days.  Dispense: 10 tablet; Refill: 0 -Continue to monitor symptoms for any change in severity if there is any escalation of current symptoms or development of new symptoms follow-up in ER for further evaluation and management.    Lakhia Gengler B December Hedtke   Siena Poehler, [place] B, [place] 07/30/23 1247

## 2023-08-03 ENCOUNTER — Ambulatory Visit (INDEPENDENT_AMBULATORY_CARE_PROVIDER_SITE_OTHER): Payer: MEDICAID

## 2023-08-03 ENCOUNTER — Ambulatory Visit (HOSPITAL_COMMUNITY): Payer: Self-pay

## 2023-08-03 VITALS — BP 107/78 | HR 80 | Ht 72.0 in | Wt 200.0 lb

## 2023-08-03 DIAGNOSIS — F250 Schizoaffective disorder, bipolar type: Secondary | ICD-10-CM

## 2023-08-03 DIAGNOSIS — F200 Paranoid schizophrenia: Secondary | ICD-10-CM

## 2023-08-03 NOTE — Progress Notes (Cosign Needed)
 Pt presents today for injection of Invega  sustenna which was tolerated successfully pts right deltoid.   JNL, CMA  Pt states that this medicine was still good for him and that he would potential want to decrease at next Doc visit. Pt denies all AVH, SI, and HI. T has no complaints for provider at this moment.

## 2023-08-26 ENCOUNTER — Telehealth (HOSPITAL_COMMUNITY): Payer: Self-pay

## 2023-08-26 NOTE — Telephone Encounter (Signed)
 Thank you for this update

## 2023-08-26 NOTE — Telephone Encounter (Signed)
 Pt is no longer on [facility], pt is now on [facility]. Pt has picked up his injection VIA the help of his peer [facility] for a 4.00 copay after speaking with tiffany at [place]. PA is good until next year.  PT should bring in:   NEW INSURANCE INFO SO WE CAN HAVE THIS ON FILE FOR INFORMATION MOVING FORTH ON.    JNL

## 2023-08-31 ENCOUNTER — Ambulatory Visit (HOSPITAL_COMMUNITY): Payer: MEDICAID

## 2023-09-02 ENCOUNTER — Ambulatory Visit (INDEPENDENT_AMBULATORY_CARE_PROVIDER_SITE_OTHER): Payer: MEDICAID

## 2023-09-02 ENCOUNTER — Encounter (HOSPITAL_COMMUNITY): Payer: Self-pay

## 2023-09-02 VITALS — BP 114/73 | HR 58 | Wt 199.6 lb

## 2023-09-02 DIAGNOSIS — F411 Generalized anxiety disorder: Secondary | ICD-10-CM

## 2023-09-02 DIAGNOSIS — F200 Paranoid schizophrenia: Secondary | ICD-10-CM

## 2023-09-02 DIAGNOSIS — G4700 Insomnia, unspecified: Secondary | ICD-10-CM

## 2023-09-02 MED ORDER — PALIPERIDONE PALMITATE ER 156 MG/ML IM SUSY
156.0000 mg | PREFILLED_SYRINGE | Freq: Once | INTRAMUSCULAR | Status: AC
  Administered 2023-09-02: 156 mg via INTRAMUSCULAR

## 2023-09-02 NOTE — Progress Notes (Signed)
Patient arrived for injectionpaliperidone (INVEGA SUSTENNA) injection 156 mg  Tolerated well in Right Arm. Pleasant as Always . NO HI/SI  NOR AH/VH ---[employer]

## 2023-09-08 ENCOUNTER — Ambulatory Visit (HOSPITAL_COMMUNITY)
Admission: EM | Admit: 2023-09-08 | Discharge: 2023-09-08 | Disposition: A | Discharge status: Home / Self Care | Payer: Self-pay | Attending: Physician Assistant | Admitting: Physician Assistant

## 2023-09-08 ENCOUNTER — Other Ambulatory Visit: Payer: Self-pay

## 2023-09-08 ENCOUNTER — Encounter (HOSPITAL_COMMUNITY): Payer: Self-pay | Admitting: *Deleted

## 2023-09-08 DIAGNOSIS — L0291 Cutaneous abscess, unspecified: Secondary | ICD-10-CM

## 2023-09-08 MED ORDER — DOXYCYCLINE HYCLATE 100 MG PO CAPS: 100.0000 mg | ORAL_CAPSULE | Freq: Two times a day (BID) | ORAL | 0 refills | Status: AC

## 2023-09-08 NOTE — ED Provider Notes (Signed)
 [facility]    CSN: [id number] Arrival date & time: 09/08/23  1109      History   Chief Complaint Chief Complaint  Patient presents with   Skin Problem    HPI Larry Simmons is a 33 y.o. male.   Patient here today for evaluation of swelling to the back of his neck he noticed recently. He reports mild associated discomfort. He has not had any fever, nausea or vomiting.   The history is provided by the patient.    Past Medical History:  Diagnosis Date   Asthma    Eczema     Patient Active Problem List   Diagnosis Date Noted   Schizoaffective disorder, bipolar type (HCC) 01/21/2023   Drug-induced erectile dysfunction 08/06/2022   Well adult exam 08/06/2022   Asthma    Eczema    Schizophrenia spectrum disorder with psychotic disorder type not yet determined (HCC) 09/11/2021    History reviewed. No pertinent surgical history.     Home Medications    Prior to Admission medications   Medication Sig Start Date End Date Taking? Authorizing Provider  albuterol  (VENTOLIN  HFA) 108 (90 Base) MCG/ACT inhaler Inhale 1-2 puffs into the lungs every 6 (six) hours as needed for wheezing or shortness of breath. 07/30/23  Yes Reddick, Johnathan B, NP  benztropine  (COGENTIN ) 0.5 MG tablet Take 1 tablet (0.5 mg total) by mouth 2 (two) times daily. 07/22/23  Yes Ardena Koyanagi E, NP  divalproex  (DEPAKOTE ) 500 MG DR tablet Take 1 tablet (500 mg total) by mouth 2 (two) times daily. 07/22/23  Yes Ardena Koyanagi E, NP  doxycycline (VIBRAMYCIN) 100 MG capsule Take 1 capsule (100 mg total) by mouth 2 (two) times daily for 7 days. 09/08/23 09/15/23 Yes Vernestine Gondola, PA-C  haloperidol  (HALDOL ) 10 MG tablet Take 1 tablet (10 mg total) by mouth 2 (two) times daily. 07/22/23  Yes Ardena Koyanagi E, NP  traZODone  (DESYREL ) 50 MG tablet Take 1 tablet (50 mg total) by mouth at bedtime as needed for sleep. 07/27/23  Yes Ardena Koyanagi E, NP  paliperidone  (INVEGA  SUSTENNA) 156 MG/ML  SUSY injection Inject 1 mL (156 mg total) into the muscle every 28 (twenty-eight) days. 07/27/23   Arlyne Bering, NP    Family History History reviewed. No pertinent family history.  Social History Social History   Tobacco Use   Smoking status: Every Day    Current packs/day: 0.50    Average packs/day: 0.5 packs/day for 13.5 years (6.8 ttl pk-yrs)    Types: Cigarettes    Start date: 03/02/2010   Smokeless tobacco: Never   Tobacco comments:    Patient currently using nicotene gum/patches to help quit.   Substance Use Topics   Alcohol use: Yes    Alcohol/week: 4.0 standard drinks of alcohol    Types: 4 Cans of beer per week    Comment: occassionl   Drug use: Not Currently    Types: Marijuana    Comment: Last used 04/2022     Allergies   Iodinated contrast media, Shellfish allergy, and Peanut-containing drug products   Review of Systems Review of Systems  Constitutional:  Negative for chills and fever.  Eyes:  Negative for discharge and redness.  Respiratory:  Negative for shortness of breath.   Gastrointestinal:  Negative for nausea and vomiting.  Skin:  Positive for color change and wound.  Neurological:  Negative for numbness.     Physical Exam Triage Vital Signs ED Triage Vitals  Encounter  Vitals Group     BP 09/08/23 1125 110/74     Girls Systolic BP Percentile --      Girls Diastolic BP Percentile --      Boys Systolic BP Percentile --      Boys Diastolic BP Percentile --      Pulse Rate 09/08/23 1125 73     Resp 09/08/23 1125 20     Temp 09/08/23 1125 98.5 F (36.9 C)     Temp src --      SpO2 09/08/23 1125 97 %     Weight --      Height --      Head Circumference --      Peak Flow --      Pain Score 09/08/23 1120 10     Pain Loc --      Pain Education --      Exclude from Growth Chart --    No data found.  Updated Vital Signs BP 110/74   Pulse 73   Temp 98.5 F (36.9 C)   Resp 20   SpO2 97%   Visual Acuity Right Eye Distance:    Left Eye Distance:   Bilateral Distance:    Right Eye Near:   Left Eye Near:    Bilateral Near:     Physical Exam Vitals and nursing note reviewed.  Constitutional:      General: He is not in acute distress.    Appearance: Normal appearance. He is not ill-appearing.  HENT:     Head: Normocephalic and atraumatic.   Eyes:     Conjunctiva/sclera: Conjunctivae normal.    Cardiovascular:     Rate and Rhythm: Normal rate.  Pulmonary:     Effort: Pulmonary effort is normal. No respiratory distress.   Skin:    Comments: Small wound to distal posterior neck with small amount of apparent drainage noted to shirt collar, surrounding induration noted.   Neurological:     Mental Status: He is alert.   Psychiatric:        Mood and Affect: Mood normal.        Behavior: Behavior normal.        Thought Content: Thought content normal.      UC Treatments / Results  Labs (all labs ordered are listed, but only abnormal results are displayed) Labs Reviewed - No data to display  EKG   Radiology No results found.  Procedures Procedures (including critical care time)  Medications Ordered in UC Medications - No data to display  Initial Impression / Assessment and Plan / UC Course  I have reviewed the triage vital signs and the nursing notes.  Pertinent labs & imaging results that were available during my care of the patient were reviewed by me and considered in my medical decision making (see chart for details).    Suspect abscess and recommended antibiotic therapy with warm compresses to promote continued drainage. Recommend follow up if no gradual improvement or with any further concerns.   Final Clinical Impressions(s) / UC Diagnoses   Final diagnoses:  Abscess   Discharge Instructions   None    ED Prescriptions     Medication Sig Dispense Auth. Provider   doxycycline (VIBRAMYCIN) 100 MG capsule Take 1 capsule (100 mg total) by mouth 2 (two) times daily for 7  days. 14 capsule Vernestine Gondola, PA-C      PDMP not reviewed this encounter.   Vernestine Gondola, PA-C 09/08/23 1212

## 2023-09-08 NOTE — ED Triage Notes (Signed)
 PT presents today because he wants to know why he gets swelled marks on the back of his neck.

## 2023-09-16 ENCOUNTER — Encounter (HOSPITAL_COMMUNITY): Payer: Self-pay | Admitting: Physician Assistant

## 2023-09-16 ENCOUNTER — Ambulatory Visit (INDEPENDENT_AMBULATORY_CARE_PROVIDER_SITE_OTHER): Payer: MEDICAID | Admitting: Physician Assistant

## 2023-09-16 VITALS — BP 122/73 | HR 61 | Temp 97.6°F | Ht 73.0 in | Wt 198.6 lb

## 2023-09-16 DIAGNOSIS — F200 Paranoid schizophrenia: Secondary | ICD-10-CM

## 2023-09-16 NOTE — Progress Notes (Signed)
 [facility] MD/PA/NP OP Progress Note  09/16/2023 11:53 AM Larry Simmons  MRN:  [id number]  Chief Complaint:  Chief Complaint  Patient presents with   Follow-up   HPI:   Larry Simmons. Larry Simmons is a 33 year old male with a past psychiatric history significant for paranoid schizophrenia who presents to [facility] for follow-up and medication management.  Patient was last seen by Dr. Harl on 07/22/2023.  During his last encounter, patient was being managed on the following psychiatric medications:  Depakote  500 mg DR 2 times daily Haldol  10 mg 2 times daily Benztropine  0.5 mg 2 times daily Trazodone  50 mg at bedtime as needed Paliperidone  (Invega  Sustenna) injection 156 mg/mL SUSY injection every 28 days  Patient reports that he continues to take his medications regularly.  He denies experiencing any adverse side effects from the use of his current medication regimen.  Patient denies overt depressive symptoms and endorses minimal anxiety.  Patient is currently a member at [employer] and states that he has been doing well within the program.  Patient denies changes in his behavior nor does he endorse paranoia.  Patient does not appear to be responding to internal/external stimuli.  A PHQ-9 screen was performed with the patient scoring a 0.  A GAD-7 screen was also performed with the patient scoring a 2.  Patient is alert and oriented x 4, calm, cooperative, and fully engaged in conversation during the encounter.  Patient endorses good mood.  Patient exhibits euthymic mood with appropriate affect.  Patient denies suicidal or homicidal ideations.  He further denies auditory or visual hallucinations and does not appear to be responding to internal/external stimuli.  Patient endorses good sleep and receives on average 9 hours of sleep per night.  Patient endorses good appetite.  Patient denies alcohol consumption, tobacco use, or illicit drug use.  Visit Diagnosis:     ICD-10-CM   1. Schizophrenia, paranoid (HCC)  F20.0       Past Psychiatric History:  Schizophrenia and depression   Past Medical History:  Past Medical History:  Diagnosis Date   Asthma    Eczema    History reviewed. No pertinent surgical history.  Family Psychiatric History:  Mother - Schizophrenia   Family History: History reviewed. No pertinent family history.  Social History:  Social History   Socioeconomic History   Marital status: Single    Spouse name: Not on file   Number of children: 0   Years of education: Not on file   Highest education level: High school graduate  Occupational History   Occupation: Social research officer, government     Comment: Museum/gallery curator  Tobacco Use   Smoking status: Every Day    Current packs/day: 0.50    Average packs/day: 0.5 packs/day for 13.5 years (6.8 ttl pk-yrs)    Types: Cigarettes    Start date: 03/02/2010   Smokeless tobacco: Never   Tobacco comments:    Patient currently using nicotene gum/patches to help quit.   Substance and Sexual Activity   Alcohol use: Yes    Alcohol/week: 4.0 standard drinks of alcohol    Types: 4 Cans of beer per week    Comment: occassionl   Drug use: Not Currently    Types: Marijuana    Comment: Last used 04/2022   Sexual activity: Not Currently    Comment: Last sexual encounter 04/2022  Other Topics Concern   Not on file  Social History Narrative   Not on file   Social  Drivers of Corporate investment banker Strain: Not on file  Food Insecurity: Food Insecurity Present (06/09/2023)   Hunger Vital Sign    Worried About Running Out of Food in the Last Year: Sometimes true    Ran Out of Food in the Last Year: Sometimes true  Transportation Needs: No Transportation Needs (06/09/2023)   PRAPARE - Administrator, Civil Service (Medical): No    Lack of Transportation (Non-Medical): No  Physical Activity: Not on file  Stress: Not on file  Social Connections: Not on file    Allergies:   Allergies  Allergen Reactions   Iodinated Contrast Media Other (See Comments)    Irritated skin   Shellfish Allergy Swelling   Peanut-Containing Drug Products Swelling    Metabolic Disorder Labs: Lab Results  Component Value Date   HGBA1C 5.3 06/10/2023   MPG 105.41 06/10/2023   MPG 102.54 09/06/2021   Lab Results  Component Value Date   PROLACTIN 20.2 01/20/2023   PROLACTIN 3.2 (L) 09/08/2021   Lab Results  Component Value Date   CHOL 161 06/10/2023   TRIG 31 06/10/2023   HDL 81 06/10/2023   CHOLHDL 2.0 06/10/2023   VLDL 6 06/10/2023   LDLCALC 74 06/10/2023   LDLCALC 81 01/20/2023   Lab Results  Component Value Date   TSH 1.783 06/10/2023   TSH 1.700 01/20/2023    Therapeutic Level Labs: No results found for: LITHIUM No results found for: VALPROATE No results found for: CBMZ  Current Medications: Current Outpatient Medications  Medication Sig Dispense Refill   albuterol  (VENTOLIN  HFA) 108 (90 Base) MCG/ACT inhaler Inhale 1-2 puffs into the lungs every 6 (six) hours as needed for wheezing or shortness of breath. 6.7 g 3   benztropine  (COGENTIN ) 0.5 MG tablet Take 1 tablet (0.5 mg total) by mouth 2 (two) times daily. 60 tablet 3   divalproex  (DEPAKOTE ) 500 MG DR tablet Take 1 tablet (500 mg total) by mouth 2 (two) times daily. 60 tablet 3   haloperidol  (HALDOL ) 10 MG tablet Take 1 tablet (10 mg total) by mouth 2 (two) times daily. 30 tablet 3   paliperidone  (INVEGA  SUSTENNA) 156 MG/ML SUSY injection Inject 1 mL (156 mg total) into the muscle every 28 (twenty-eight) days. 1 mL 11   traZODone  (DESYREL ) 50 MG tablet Take 1 tablet (50 mg total) by mouth at bedtime as needed for sleep. 30 tablet 3   No current facility-administered medications for this visit.     Musculoskeletal: Strength & Muscle Tone: within normal limits Gait & Station: normal Patient leans: N/A  Psychiatric Specialty Exam: Review of Systems  Psychiatric/Behavioral:  Negative for  decreased concentration, dysphoric mood, hallucinations, self-injury, sleep disturbance and suicidal ideas. The patient is not nervous/anxious and is not hyperactive.     Blood pressure 122/73, pulse 61, temperature 97.6 F (36.4 C), temperature source Oral, height 6' 1 (1.854 m), weight 198 lb 9.6 oz (90.1 kg), SpO2 100%.Body mass index is 26.2 kg/m.  General Appearance: Well Groomed  Eye Contact:  Good  Speech:  Clear and Coherent and Normal Rate  Volume:  Normal  Mood:  Euthymic  Affect:  Appropriate  Thought Process:  Coherent, Goal Directed, and Linear  Orientation:  Full (Time, Place, and Person)  Thought Content: WDL   Suicidal Thoughts:  No  Homicidal Thoughts:  No  Memory:  Immediate;   Good Recent;   Good Remote;   Good  Judgement:  Good  Insight:  Good  Psychomotor Activity:  Normal  Concentration:  Concentration: Good and Attention Span: Good  Recall:  Good  Fund of Knowledge: Good  Language: Good  Akathisia:  No  Handed:  Right  AIMS (if indicated): done, WNL, 0  Assets:  Communication Skills Desire for Improvement Financial Resources/Insurance Housing Intimacy Leisure Time Physical Health Social Support  ADL's:  Intact  Cognition: WNL  Sleep:  Good   Screenings: AIMS    Flowsheet Row Clinical Support from 09/16/2023 in [facility] Clinical Support from 02/16/2023 in [facility] Office Visit from 10/20/2022 in [facility] Office Visit from 06/18/2022 in [facility] (Discharged[facility]  AIMS Total Score 0 0 1 0 2   AUDIT    Flowsheet Row Video Visit from 01/20/2023 in [facility] Admission (Discharged) from [facility]  Alcohol Use Disorder Identification Test Final Score (AUDIT) 12 4   GAD-7     Flowsheet Row Clinical Support from 09/16/2023 in [facility] Clinical Support from 07/22/2023 in [facility] Clinical Support from 02/16/2023 in [facility] Video Visit from 01/20/2023 in [facility] Clinical Support from 12/08/2022 in [facility]  Total GAD-7 Score 2 1 0 18 1   PHQ2-9    Flowsheet Row Clinical Support from 09/16/2023 in [facility] Clinical Support from 07/22/2023 in [facility] Clinical Support from 02/16/2023 in [facility] Video Visit from 01/20/2023 in [facility] Clinical Support from 12/08/2022 in [facility]  PHQ-2 Total Score 0 0 0 2 1  PHQ-9 Total Score -- 3 0 13 2   Flowsheet Row Clinical Support from 09/16/2023 in [facility] UC from 09/08/2023 in [facility] UC from 07/30/2023 in [facility] RISK CATEGORY No Risk No Risk No Risk     Assessment and Plan:   Larry Simmons is a 33 year old male with a past psychiatric history significant for paranoid schizophrenia who presents to [facility] for follow-up and medication management.  Patient presents to the encounter stating that he has been taking his medications regularly.  Patient denies experiencing any adverse side effects associated with his use of his current medication regimen.  An aims assessment was performed with the patient scoring of 0.  Patient denies overt depressive symptoms and endorses minimal anxiety.  Patient further denies paranoia and does not appear to be responding to internal/external stimuli.  Patient endorses stability through his current medication regimen and would like to continue  taking his medications as prescribed.  Patient's labs and EKG are up to date.  Patient denies suicidal ideations and is able to contract for safety following the conclusion of the encounter.  Collaboration of Care: Collaboration of Care: Medication Management AEB provider managing patient's psychiatric medications and Psychiatrist AEB patient being followed by the mental health provider at this facility  Patient/Guardian was advised Release of Information must be obtained prior to any record release in order to collaborate their care with an outside provider. Patient/Guardian was advised if they have not already done so to contact the registration department to sign all necessary forms in order for us  to  release information regarding their care.   Consent: Patient/Guardian gives verbal consent for treatment and assignment of benefits for services provided during this visit. Patient/Guardian expressed understanding and agreed to proceed.   1. Schizophrenia, paranoid (HCC) (Primary) Patient to continue taking Depakote  500 mg DR 2 times daily for the management of his schizophrenia Patient to continue taking haloperidol  10 mg 2 times daily for the management of his schizophrenia (paranoid type) Patient to continue taking benztropine  0.5 mg 2 times daily for the management of his schizophrenia (paranoid type) Patient to continue taking trazodone  50 mg at bedtime as needed for sleep Patient to continue receiving Invega  Sustenna 156 mg/mL injection every 28 days for the management of his schizophrenia (paranoid type)  Patient to follow up in 2 months Patient's next injection is scheduled for 09/30/2023 at [facility] Provider spent a total of 30 minutes with the patient/reviewing patient's chart  Larry FORBES Bolster, PA 09/16/2023, 11:53 AM

## 2023-09-17 IMAGING — CT CT HEAD W/O CM
4 series · 16 of 47 positions shown, 18 images · non-contrast
Comparison: None Available.

CLINICAL DATA: Psychosis



[Series 2: head wo · axial · 0.44mm/px · z∈[-146,-26]mm · 7 of 33 slices shown, 9 images]
[im 5/33  brain]
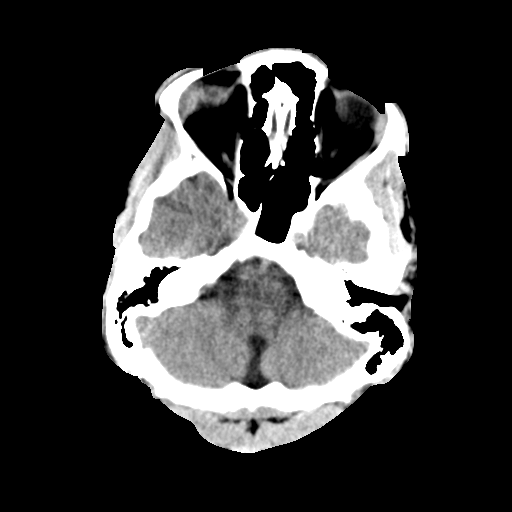
[im 5/33  bone]
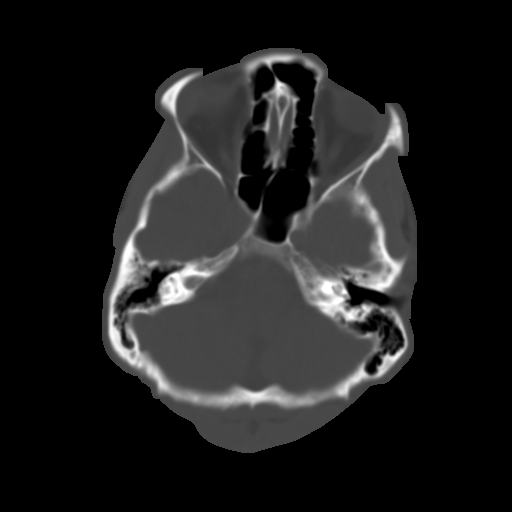
[im 9/33  brain]
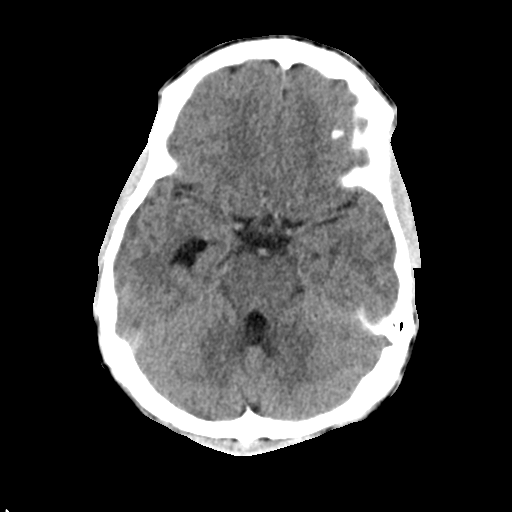
[im 13/33  brain]
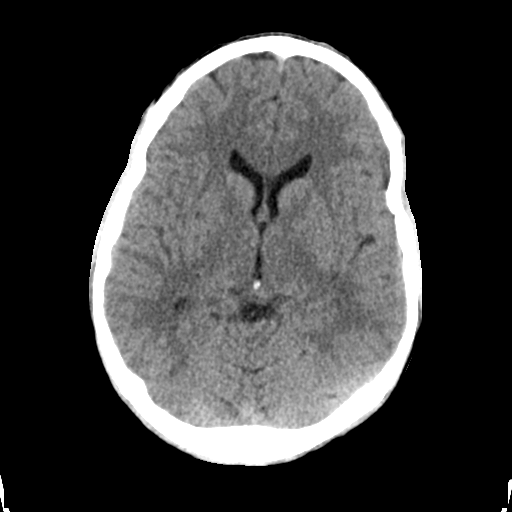
[im 17/33  brain]
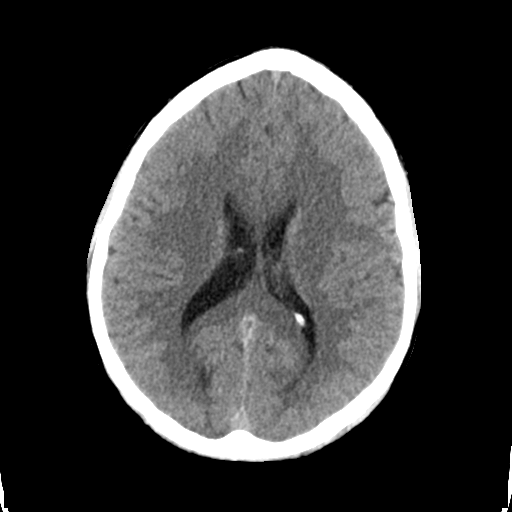
[im 21/33  brain]
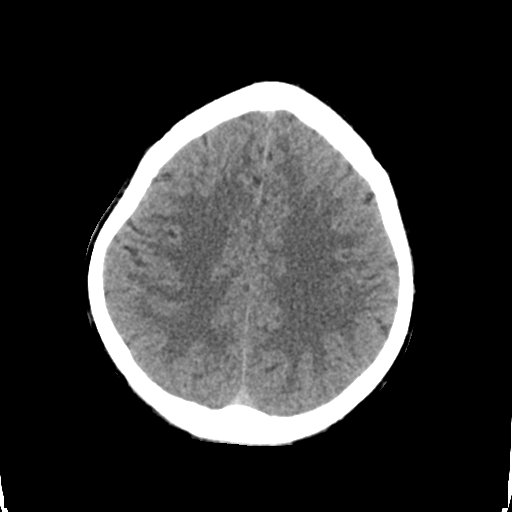
[im 21/33  bone]
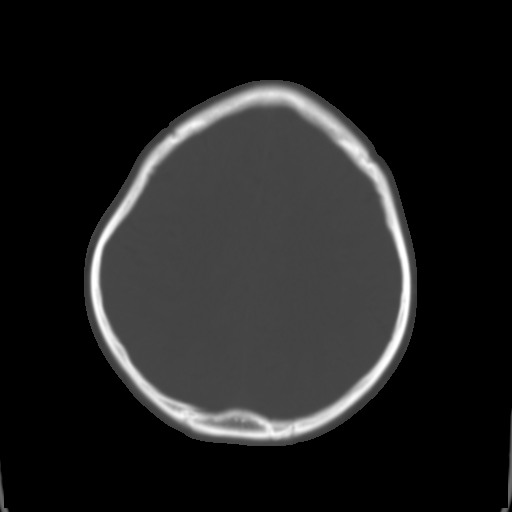
[im 25/33  brain]
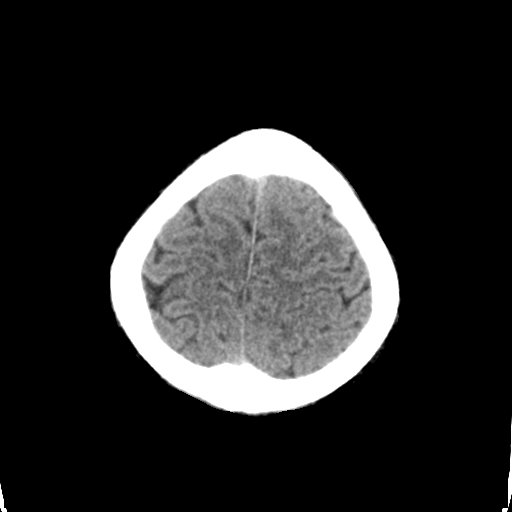
[im 29/33  brain]
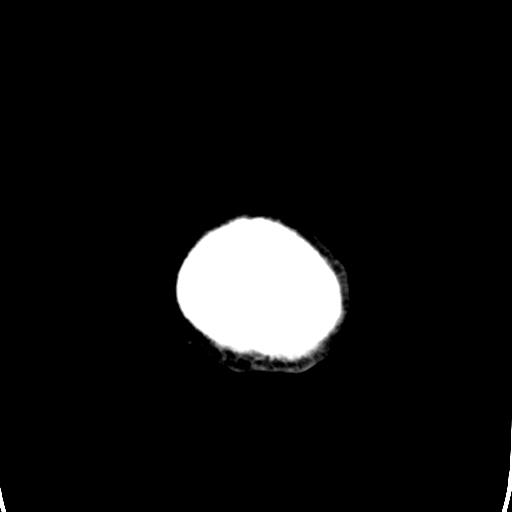

[Series 3: head bone · axial · 0.44mm/px · z∈[-150,-118]mm · 3 of 81 slices shown]
[im 9/81  bone]
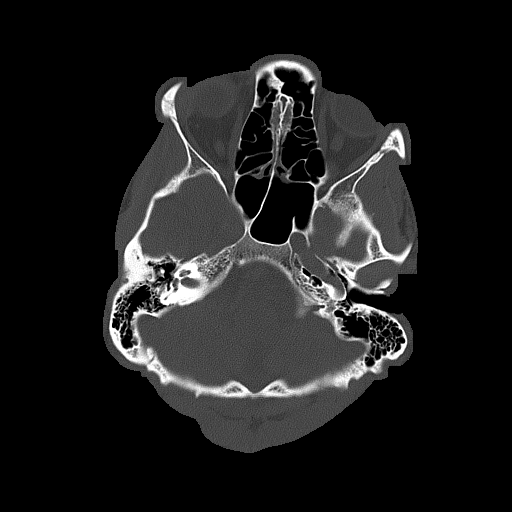
[im 17/81  bone]
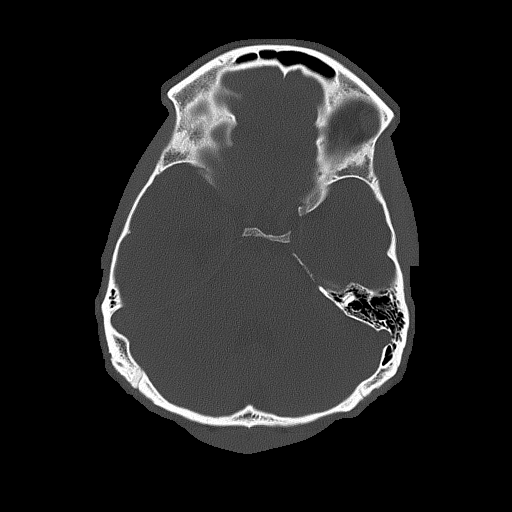
[im 25/81  bone]
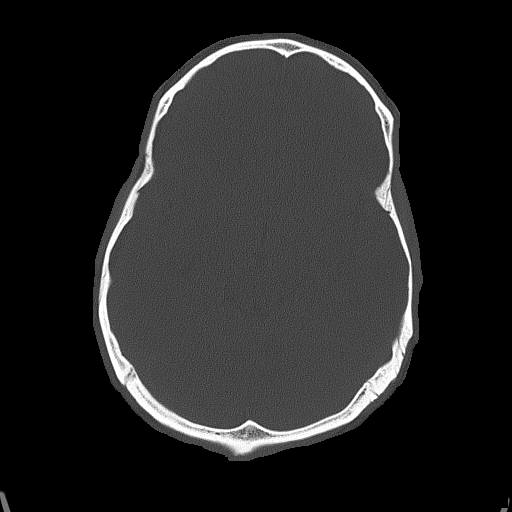

[Series 4: sagittal soft tissue · sagittal · 0.33mm/px · 3 of 60 slices shown]
[im 20/60  brain]
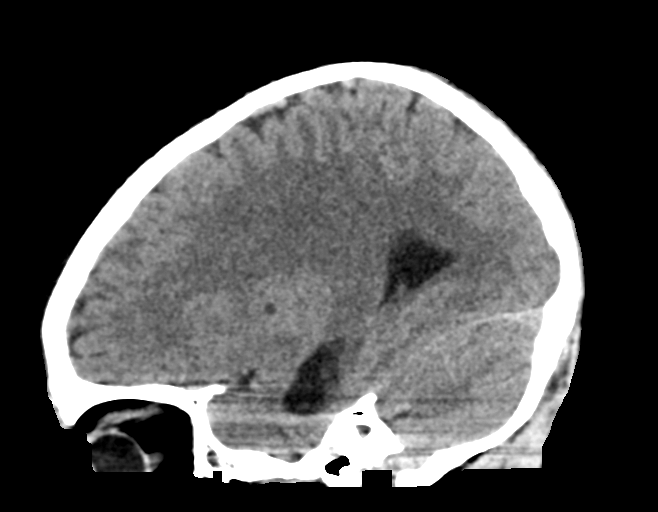
[im 30/60  brain]
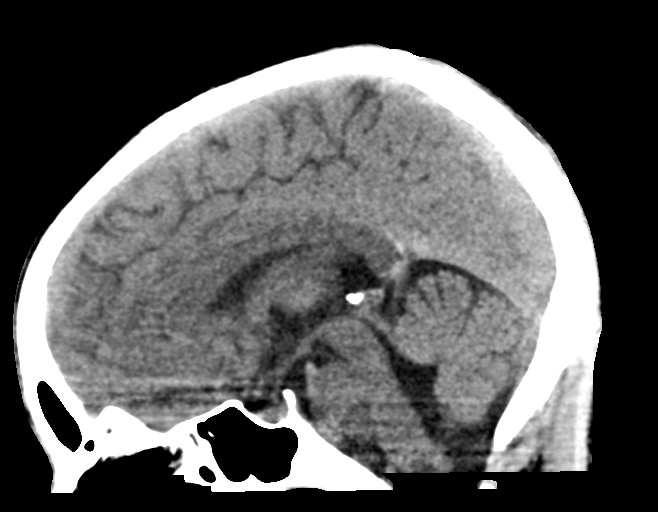
[im 40/60  brain]
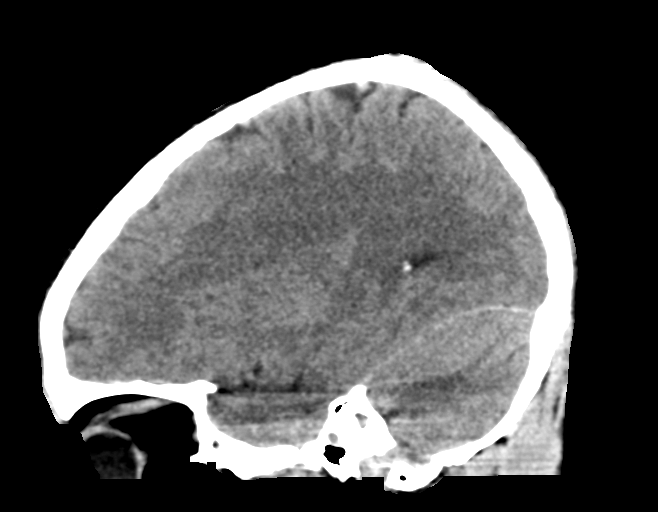

[Series 5: coronal soft tissue · coronal · 0.33mm/px · 3 of 73 slices shown]
[im 25/73  brain]
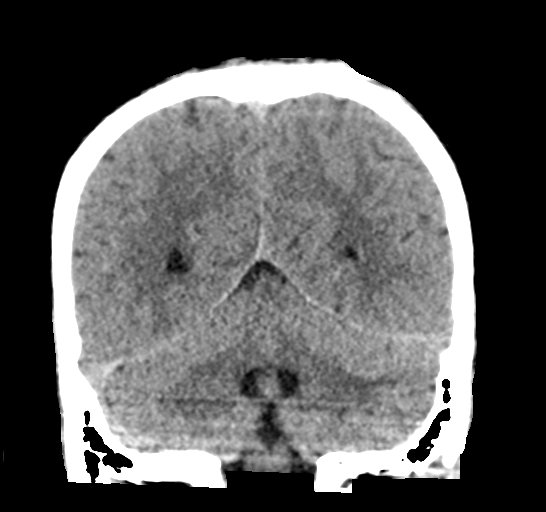
[im 33/73  brain]
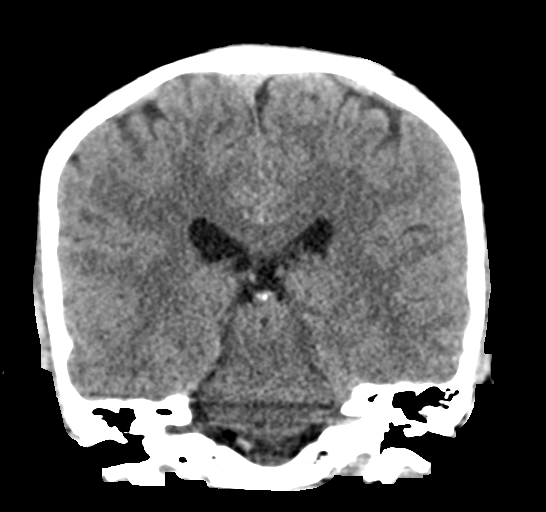
[im 41/73  brain]
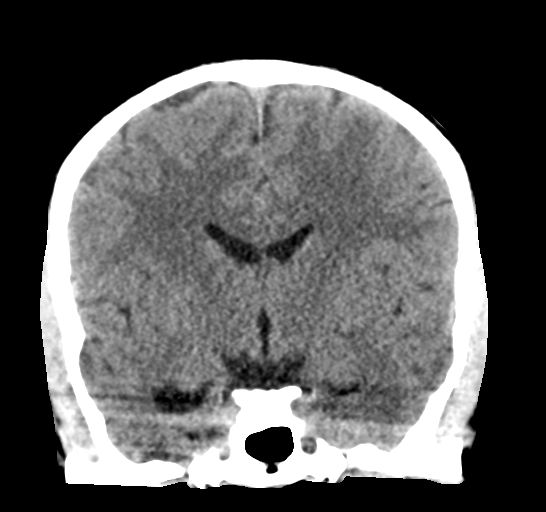

[16 of 47 positions shown; findings below may reference images not displayed]

FINDINGS: Brain: No acute territorial infarction, hemorrhage or intracranial
mass is visualized. There is mild asymmetric enlargement of the
right temporal horn with otherwise normal size right lateral
ventricle.

Vascular: No hyperdense vessel or unexpected calcification.

Skull: Normal. Negative for fracture or focal lesion.

Sinuses/Orbits: No acute finding.

Other: None
IMPRESSION: 1. Mild asymmetric enlargement of the right temporal horn of the
lateral ventricle. Question if secondary to focal volume loss of the
medial right temporal lobe, no surrounding edema or transependymal
extravasation of CSF. Suggest MRI for further evaluation.
2. The brain is otherwise normal in CT appearance.

These results will be called to the ordering clinician or
representative by the Radiologist Assistant, and communication
documented in the PACS or [REDACTED].

## 2023-09-22 ENCOUNTER — Encounter (HOSPITAL_COMMUNITY): Payer: Self-pay

## 2023-09-22 ENCOUNTER — Ambulatory Visit (HOSPITAL_COMMUNITY)
Admission: EM | Admit: 2023-09-22 | Discharge: 2023-09-22 | Disposition: A | Discharge status: Home / Self Care | Payer: MEDICAID | Attending: Family Medicine | Admitting: Family Medicine

## 2023-09-22 DIAGNOSIS — L0211 Cutaneous abscess of neck: Secondary | ICD-10-CM | POA: Diagnosis not present

## 2023-09-22 HISTORY — DX: Emotional lability: R45.86

## 2023-09-22 MED ORDER — LIDOCAINE-EPINEPHRINE 1 %-1:100000 IJ SOLN
INTRAMUSCULAR | Status: AC
  Filled 2023-09-22: qty 1

## 2023-09-22 MED ORDER — LIDOCAINE HCL 2 % IJ SOLN
INTRAMUSCULAR | Status: AC
  Filled 2023-09-22: qty 20

## 2023-09-22 MED ORDER — DOXYCYCLINE HYCLATE 100 MG PO CAPS: 100.0000 mg | ORAL_CAPSULE | Freq: Two times a day (BID) | ORAL | 0 refills | Status: DC

## 2023-09-22 NOTE — ED Triage Notes (Signed)
 Patient reports that he was bitten by an insect on the back of his neck a week ago.  Patient states he has been using Neosporin.

## 2023-09-22 NOTE — ED Notes (Signed)
Called x2 for triage. No answer.

## 2023-09-22 NOTE — ED Notes (Signed)
Called x1 for triage. No answer

## 2023-09-23 NOTE — ED Provider Notes (Signed)
 [facility]   [id number] 09/22/23 Arrival Time: 9144  ASSESSMENT & PLAN:  1. Abscess of neck    Incision and Drainage Procedure Note  Anesthesia: 1% lidocaine with epinephrine  Procedure Details  The procedure, risks and complications have been discussed in detail (including, but not limited to pain and bleeding) with the patient. Verbal consent obtained.  The skin induration was prepped and draped in the usual fashion. After adequate local anesthesia, I&D with a #11 blade was performed on the midline posterior neck (approx 1 cm incision) with copious, bloody, purulent drainage.  EBL: minimal Drains: none Packing: 1/4 iodoform after hemostasis obtained Condition: Tolerated procedure well Complications: incision site continued to ooze blood; direct pressure applied for up to 15 min; bleeding continued; silver nitrate used to cauterize lateral left side of wound opening; hemostasis obtained; monitored patient for 15 minutes without further bleeding.  Begin: Meds ordered this encounter  Medications   doxycycline  (VIBRAMYCIN ) 100 MG capsule    Sig: Take 1 capsule (100 mg total) by mouth 2 (two) times daily.    Dispense:  14 capsule    Refill:  0    Wound care instructions discussed and given in written format. To return in 48 hours for wound check.  Finish all antibiotics. OTC analgesics as needed.  Reviewed expectations re: course of current medical issues. Questions answered. Outlined signs and symptoms indicating need for more acute intervention. Patient verbalized understanding. After Visit Summary given.   SUBJECTIVE:  Larry Simmons is a 33 y.o. male who presents with a possible infection of his posterior neck. Onset gradual, approximately 1 week ago without active drainage and without active bleeding. Area has enlarged. Denies fever. No tx PTA.   OBJECTIVE:  Vitals:   09/22/23 0946  BP: 117/82  Pulse: 94  Resp: 16  Temp: 97.8 F (36.6 C)   TempSrc: Oral  SpO2: 93%     General appearance: alert; no distress Neck: approx 1.5 cm induration of his posterior midline neck; tender to touch; no active drainage or bleeding; mild overlying erythema Psychological: alert and cooperative; normal mood and affect  Allergies  Allergen Reactions   Iodinated Contrast Media Other (See Comments)    Irritated skin   Shellfish Allergy Swelling   Peanut-Containing Drug Products Swelling    Past Medical History:  Diagnosis Date   Asthma    Eczema    Mood swings    Social History   Socioeconomic History   Marital status: Single    Spouse name: Not on file   Number of children: 0   Years of education: Not on file   Highest education level: High school graduate  Occupational History   Occupation: Social research officer, government     Comment: Museum/gallery curator  Tobacco Use   Smoking status: Every Day    Current packs/day: 0.50    Average packs/day: 0.5 packs/day for 13.6 years (6.8 ttl pk-yrs)    Types: Cigarettes    Start date: 03/02/2010   Smokeless tobacco: Never   Tobacco comments:    Patient currently using nicotene gum/patches to help quit.   Vaping Use   Vaping status: Never Used  Substance and Sexual Activity   Alcohol use: Yes    Alcohol/week: 4.0 standard drinks of alcohol    Types: 4 Cans of beer per week    Comment: occassionl   Drug use: Not Currently    Types: Marijuana    Comment: Last used 04/2022   Sexual activity: Not Currently  Comment: Last sexual encounter 04/2022  Other Topics Concern   Not on file  Social History Narrative   Not on file   Social Drivers of Health   Financial Resource Strain: Not on file  Food Insecurity: Food Insecurity Present (06/09/2023)   Hunger Vital Sign    Worried About Running Out of Food in the Last Year: Sometimes true    Ran Out of Food in the Last Year: Sometimes true  Transportation Needs: No Transportation Needs (06/09/2023)   PRAPARE - Administrator, Civil Service  (Medical): No    Lack of Transportation (Non-Medical): No  Physical Activity: Not on file  Stress: Not on file  Social Connections: Not on file   History reviewed. No pertinent family history. History reviewed. No pertinent surgical history.          Rolinda Rogue, MD 09/23/23 1154

## 2023-09-24 ENCOUNTER — Encounter (HOSPITAL_COMMUNITY): Payer: Self-pay

## 2023-09-24 ENCOUNTER — Ambulatory Visit (HOSPITAL_COMMUNITY)
Admission: EM | Admit: 2023-09-24 | Discharge: 2023-09-24 | Disposition: A | Discharge status: Home / Self Care | Payer: MEDICAID

## 2023-09-24 VITALS — BP 122/85 | HR 102 | Temp 97.9°F | Resp 18

## 2023-09-24 DIAGNOSIS — Z5189 Encounter for other specified aftercare: Secondary | ICD-10-CM

## 2023-09-24 NOTE — ED Provider Notes (Signed)
 [facility]    CSN: [id number] Arrival date & time: 09/24/23  1059      History   Chief Complaint Chief Complaint  Patient presents with   Wound Check    HPI Larry Simmons is a 33 y.o. male.   Patient presents for wound check and packing removal.  Patient had an abscess in the back of his neck drained 2 days ago and packing was placed at that time.  Patient states that he has not remove the dressing nor looked at the area since he was seen here 2 days ago.    Patient states that he has also not picked up the prescription for doxycycline . Denies fever, body aches, chills, and weakness.  The history is provided by the patient and medical records.  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 No pertinent surgical history.     Home Medications    Prior to Admission medications   Medication Sig Start Date End Date Taking? Authorizing Provider  albuterol  (VENTOLIN  HFA) 108 (90 Base) MCG/ACT inhaler Inhale 1-2 puffs into the lungs every 6 (six) hours as needed for wheezing or shortness of breath. 07/30/23   Reddick, Johnathan B, NP  benztropine  (COGENTIN ) 0.5 MG tablet Take 1 tablet (0.5 mg total) by mouth 2 (two) times daily. 07/22/23   Harl Zane BRAVO, NP  divalproex  (DEPAKOTE ) 500 MG DR tablet Take 1 tablet (500 mg total) by mouth 2 (two) times daily. 07/22/23   Harl Zane BRAVO, NP  doxycycline  (VIBRAMYCIN ) 100 MG capsule Take 1 capsule (100 mg total) by mouth 2 (two) times daily. 09/22/23   Rolinda Rogue, MD  paliperidone  (INVEGA  SUSTENNA) 156 MG/ML SUSY injection Inject 1 mL (156 mg  total) into the muscle every 28 (twenty-eight) days. 07/27/23   Harl Zane BRAVO, NP  traZODone  (DESYREL ) 50 MG tablet Take 1 tablet (50 mg total) by mouth at bedtime as needed for sleep. 07/27/23   Harl Zane BRAVO, NP    Family History History reviewed. No pertinent family history.  Social History Social History   Tobacco Use   Smoking status: Every Day    Current packs/day: 0.50    Average packs/day: 0.5 packs/day for 13.6 years (6.8 ttl pk-yrs)    Types: Cigarettes    Start date: 03/02/2010   Smokeless tobacco: Never   Tobacco comments:    Patient currently using nicotene gum/patches to help quit.   Vaping Use   Vaping status: Never Used  Substance Use Topics   Alcohol use: Yes    Alcohol/week: 4.0 standard drinks of alcohol    Types: 4 Cans of beer per week    Comment: occassionl   Drug use: Not Currently    Types: Marijuana    Comment: Last used 04/2022     Allergies   Iodinated contrast media, Shellfish allergy, and Peanut-containing drug products   Review of Systems Review of Systems  Per HPI  Physical Exam Triage Vital Signs ED Triage Vitals  Encounter Vitals Group     BP 09/24/23 1108 122/85  Girls Systolic BP Percentile --      Girls Diastolic BP Percentile --      Boys Systolic BP Percentile --      Boys Diastolic BP Percentile --      Pulse Rate 09/24/23 1108 (!) 102     Resp 09/24/23 1108 18     Temp 09/24/23 1108 97.9 F (36.6 C)     Temp Source 09/24/23 1108 Oral     SpO2 09/24/23 1108 96 %     Weight --      Height --      Head Circumference --      Peak Flow --      Pain Score 09/24/23 1107 7     Pain Loc --      Pain Education --      Exclude from Growth Chart --    No data found.  Updated Vital Signs BP 122/85 (BP Location: Left Arm)   Pulse (!) 102   Temp 97.9 F (36.6 C) (Oral)   Resp 18   SpO2 96%   Visual Acuity Right Eye Distance:   Left Eye Distance:   Bilateral Distance:    Right Eye Near:   Left Eye  Near:    Bilateral Near:     Physical Exam Vitals and nursing note reviewed.  Constitutional:      General: He is awake. He is not in acute distress.    Appearance: Normal appearance. He is well-developed and well-groomed. He is not ill-appearing.  Skin:    General: Skin is warm and dry.     Comments: 1 cm incision noted to mid posterior neck.  Packing in place.  Purulent drainage noted from the area  Neurological:     Mental Status: He is alert.  Psychiatric:        Behavior: Behavior is cooperative.      UC Treatments / Results  Labs (all labs ordered are listed, but only abnormal results are displayed) Labs Reviewed - No data to display  EKG   Radiology No results found.  Procedures Procedures (including critical care time)  Medications Ordered in UC Medications - No data to display  Initial Impression / Assessment and Plan / UC Course  I have reviewed the triage vital signs and the nursing notes.  Pertinent labs & imaging results that were available during my care of the patient were reviewed by me and considered in my medical decision making (see chart for details).     Patient is well-appearing.  Vitals are stable.  Upon assessment there is a 1 cm incision noted to the mid posterior neck.  Packing is in place.  Purulent drainage noted from the area.  Packing removed and drained a moderate amount of purulent and bloody drainage.  Reapplied dressing in clinic.  Stressed the importance of picking up the doxycycline  and taking this as prescribed for further infection prevention.  Discussed wound care at home.  Discussed follow-up and return precautions. Final Clinical Impressions(s) / UC Diagnoses   Final diagnoses:  Visit for wound check     Discharge Instructions      We have remove the packing from your wound today. Please pick up the doxycycline  that was prescribed to you during your last visit and start taking this twice daily for 10 days for infection  coverage. Apply warm wet washcloth to the area on the back of your neck a few times throughout the day to promote drainage from the area. Keep  the area clean dry and covered. Return here if you notice increased swelling, spreading redness, or loss of puslike drainage from the area. Otherwise follow-up with your primary care provider or return here as needed.    ED Prescriptions   None    PDMP not reviewed this encounter.   Johnie Flaming A, NP 09/24/23 1130

## 2023-09-24 NOTE — Discharge Instructions (Addendum)
 We have remove the packing from your wound today. Please pick up the doxycycline  that was prescribed to you during your last visit and start taking this twice daily for 10 days for infection coverage. Apply warm wet washcloth to the area on the back of your neck a few times throughout the day to promote drainage from the area. Keep the area clean dry and covered. Return here if you notice increased swelling, spreading redness, or loss of puslike drainage from the area. Otherwise follow-up with your primary care provider or return here as needed.

## 2023-09-24 NOTE — ED Triage Notes (Addendum)
 Patient states he is here today to have packing removed from dressing to the back of his neck. Patient states he was not able to pick up his prescribed abx.

## 2023-09-30 ENCOUNTER — Ambulatory Visit (HOSPITAL_COMMUNITY): Payer: MEDICAID

## 2023-09-30 ENCOUNTER — Encounter (HOSPITAL_COMMUNITY): Payer: Self-pay

## 2023-09-30 VITALS — BP 116/73 | HR 78 | Temp 97.7°F | Ht 73.0 in | Wt 198.0 lb

## 2023-09-30 DIAGNOSIS — F200 Paranoid schizophrenia: Secondary | ICD-10-CM | POA: Diagnosis not present

## 2023-09-30 MED ORDER — PALIPERIDONE PALMITATE ER 156 MG/ML IM SUSY
156.0000 mg | PREFILLED_SYRINGE | INTRAMUSCULAR | Status: DC
  Administered 2023-09-30 – 2023-11-25 (×3): 156 mg via INTRAMUSCULAR

## 2023-09-30 NOTE — Progress Notes (Cosign Needed)
 Patient in today for due Invega  Sustenna 156 mg/ml IM every 28 days.  Patient presented with flat affect, level and pleasant mood and denied any auditory or visual hallucinations, no suicidal or homicidal ideations, plans, intent, or means.  Patient discussed liking his current job and denied any problems at home, with sleep or appetite.  Patient's Invega  Sustenna 156 mg/ml IM prepared as ordered and given to patient in his left deltoid area. Patient tolerated due injection without complaint of pain or discomfort. Patient to return in 4 weeks and to call if any issues prior to then.

## 2023-10-28 ENCOUNTER — Ambulatory Visit (INDEPENDENT_AMBULATORY_CARE_PROVIDER_SITE_OTHER): Payer: MEDICAID

## 2023-10-28 VITALS — BP 106/70 | HR 78 | Ht 73.0 in | Wt 195.0 lb

## 2023-10-28 DIAGNOSIS — F29 Unspecified psychosis not due to a substance or known physiological condition: Secondary | ICD-10-CM | POA: Diagnosis not present

## 2023-10-28 DIAGNOSIS — F200 Paranoid schizophrenia: Secondary | ICD-10-CM

## 2023-10-28 NOTE — Progress Notes (Cosign Needed)
 Pt presents today for injection of Invega  sustenna 156 mg which was tolerated successfully pts right deltoid.    JNL, CMA   Pt states that this medicine was still good for him. Pt denies all AVH, SI, and HI. Pt has no complaints for provider at this moment.

## 2023-11-01 ENCOUNTER — Ambulatory Visit (HOSPITAL_COMMUNITY)
Admission: EM | Admit: 2023-11-01 | Discharge: 2023-11-01 | Disposition: A | Discharge status: Home / Self Care | Payer: MEDICAID | Attending: Emergency Medicine | Admitting: Emergency Medicine

## 2023-11-01 ENCOUNTER — Encounter (HOSPITAL_COMMUNITY): Payer: Self-pay

## 2023-11-01 DIAGNOSIS — M25551 Pain in right hip: Secondary | ICD-10-CM | POA: Diagnosis not present

## 2023-11-01 DIAGNOSIS — M25552 Pain in left hip: Secondary | ICD-10-CM | POA: Diagnosis not present

## 2023-11-01 MED ORDER — IBUPROFEN 400 MG PO TABS: 400.0000 mg | ORAL_TABLET | Freq: Four times a day (QID) | ORAL | 0 refills | Status: DC | PRN

## 2023-11-01 MED ORDER — ACETAMINOPHEN 325 MG PO TABS: 650.0000 mg | ORAL_TABLET | Freq: Four times a day (QID) | ORAL | 0 refills | Status: DC | PRN

## 2023-11-01 NOTE — Discharge Instructions (Addendum)
 Alternate between 400 mg of ibuprofen  and 650 mg of Tylenol  every 6-8 hours as needed for hip pain.   You can also alternate between ice and heat as needed for pain.   Continue walking and doing some gentle stretching to help with your pain. You can follow-up with [facility] for further evaluation if your pain continues. Otherwise follow-up with your primary care provider or return here as needed.

## 2023-11-01 NOTE — ED Provider Notes (Signed)
 [facility]    CSN: [id number] Arrival date & time: 11/01/23  1020      History   Chief Complaint Chief Complaint  Patient presents with   Hip Pain    HPI Larry Simmons is a 33 y.o. male.   Patient presents with bilateral hip pain that began yesterday.  Patient states that the pain is worse when walking.  Patient describes the pain as soreness.  Denies any recent falls or injuries.  Denies any pain, numbness, or weakness radiating down his legs.  Denies saddle anesthesia or bowel/bladder incontinence.  Also denies back pain.  Patient denies taking any medication for his symptoms.  Patient is requesting a work note.  The history is provided by the patient and medical records.  Hip Pain    Past Medical History:  Diagnosis Date   Asthma    Eczema    Mood swings     Patient Active Problem List   Diagnosis Date Noted   Schizoaffective disorder, bipolar type (HCC) 01/21/2023   Drug-induced erectile dysfunction 08/06/2022   Well adult exam 08/06/2022   Asthma    Eczema    Schizophrenia spectrum disorder with psychotic disorder type not yet determined (HCC) 09/11/2021    History reviewed. No pertinent surgical history.     Home Medications    Prior to Admission medications   Medication Sig Start Date End Date Taking? Authorizing Provider  acetaminophen  (TYLENOL ) 325 MG tablet Take 2 tablets (650 mg total) by mouth every 6 (six) hours as needed. 11/01/23  Yes Johnie, Rhea Thrun A, NP  ibuprofen  (ADVIL ) 400 MG tablet Take 1 tablet (400 mg total) by mouth every 6 (six) hours as needed. 11/01/23  Yes Johnie Flaming A, NP  albuterol  (VENTOLIN  HFA) 108 (90 Base) MCG/ACT inhaler Inhale 1-2 puffs into the lungs every 6 (six) hours as needed for wheezing or shortness of breath. 07/30/23   Reddick, Johnathan B, NP  benztropine  (COGENTIN ) 0.5 MG tablet Take 1 tablet (0.5 mg total) by mouth 2 (two) times daily. 07/22/23   Harl Zane BRAVO, NP  divalproex  (DEPAKOTE ) 500  MG DR tablet Take 1 tablet (500 mg total) by mouth 2 (two) times daily. 07/22/23   Harl Zane BRAVO, NP  paliperidone  (INVEGA  SUSTENNA) 156 MG/ML SUSY injection Inject 1 mL (156 mg total) into the muscle every 28 (twenty-eight) days. 07/27/23   Harl Zane BRAVO, NP  traZODone  (DESYREL ) 50 MG tablet Take 1 tablet (50 mg total) by mouth at bedtime as needed for sleep. 07/27/23   Harl Zane BRAVO, NP    Family History History reviewed. No pertinent family history.  Social History Social History   Tobacco Use   Smoking status: Every Day    Current packs/day: 0.50    Average packs/day: 0.5 packs/day for 13.7 years (6.8 ttl pk-yrs)    Types: Cigarettes    Start date: 03/02/2010   Smokeless tobacco: Never   Tobacco comments:    Reports down to 2 a day and trying to quit   Vaping Use   Vaping status: Never Used  Substance Use Topics   Alcohol use: Not Currently    Comment: occassionl   Drug use: Not Currently    Types: Marijuana    Comment: Last used 04/2022     Allergies   Iodinated contrast media, Shellfish allergy, and Peanut-containing drug products   Review of Systems Review of Systems  Per HPI  Physical Exam Triage Vital Signs ED Triage Vitals  Encounter Vitals Group  BP 11/01/23 1230 112/70     Girls Systolic BP Percentile --      Girls Diastolic BP Percentile --      Boys Systolic BP Percentile --      Boys Diastolic BP Percentile --      Pulse Rate 11/01/23 1230 77     Resp 11/01/23 1230 18     Temp 11/01/23 1230 98.1 F (36.7 C)     Temp Source 11/01/23 1230 Oral     SpO2 11/01/23 1230 99 %     Weight --      Height --      Head Circumference --      Peak Flow --      Pain Score 11/01/23 1231 10     Pain Loc --      Pain Education --      Exclude from Growth Chart --    No data found.  Updated Vital Signs BP 112/70 (BP Location: Right Arm)   Pulse 77   Temp 98.1 F (36.7 C) (Oral)   Resp 18   SpO2 99%   Visual Acuity Right Eye  Distance:   Left Eye Distance:   Bilateral Distance:    Right Eye Near:   Left Eye Near:    Bilateral Near:     Physical Exam Vitals and nursing note reviewed.  Constitutional:      General: He is awake. He is not in acute distress.    Appearance: Normal appearance. He is well-developed and well-groomed. He is not ill-appearing.  Musculoskeletal:     Right hip: Normal. No deformity, tenderness or bony tenderness. Normal range of motion. Normal strength.     Left hip: Normal. No deformity, tenderness or bony tenderness. Normal range of motion. Normal strength.  Skin:    General: Skin is warm and dry.  Neurological:     Mental Status: He is alert.  Psychiatric:        Behavior: Behavior is cooperative.      UC Treatments / Results  Labs (all labs ordered are listed, but only abnormal results are displayed) Labs Reviewed - No data to display  EKG   Radiology No results found.  Procedures Procedures (including critical care time)  Medications Ordered in UC Medications - No data to display  Initial Impression / Assessment and Plan / UC Course  I have reviewed the triage vital signs and the nursing notes.  Pertinent labs & imaging results that were available during my care of the patient were reviewed by me and considered in my medical decision making (see chart for details).     Patient is overall well-appearing.  Vitals are stable.  No significant findings on exam.  Gait is intact.  Prescribed Tylenol  and ibuprofen  as needed for pain.  Given orthopedic follow-up as needed.  Provided patient with work note as requested.  Discussed follow-up and return precautions. Final Clinical Impressions(s) / UC Diagnoses   Final diagnoses:  Bilateral hip pain     Discharge Instructions      Alternate between 400 mg of ibuprofen  and 650 mg of Tylenol  every 6-8 hours as needed for hip pain.   You can also alternate between ice and heat as needed for pain.   Continue walking  and doing some gentle stretching to help with your pain. You can follow-up with [facility] for further evaluation if your pain continues. Otherwise follow-up with your primary care provider or return here as needed.  ED Prescriptions     Medication Sig Dispense Auth. Provider   ibuprofen  (ADVIL ) 400 MG tablet Take 1 tablet (400 mg total) by mouth every 6 (six) hours as needed. 30 tablet Johnie Flaming A, NP   acetaminophen  (TYLENOL ) 325 MG tablet Take 2 tablets (650 mg total) by mouth every 6 (six) hours as needed. 30 tablet Johnie Flaming A, NP      PDMP not reviewed this encounter.   Johnie Flaming A, NP 11/01/23 1306

## 2023-11-01 NOTE — ED Triage Notes (Signed)
 Pt c/o bilateral hip pain that started 6am. Denies injury, radiating down legs, or taken any meds for sx's.

## 2023-11-04 ENCOUNTER — Other Ambulatory Visit (HOSPITAL_COMMUNITY): Payer: Self-pay | Admitting: Psychiatry

## 2023-11-04 DIAGNOSIS — F200 Paranoid schizophrenia: Secondary | ICD-10-CM

## 2023-11-18 ENCOUNTER — Ambulatory Visit (INDEPENDENT_AMBULATORY_CARE_PROVIDER_SITE_OTHER): Payer: MEDICAID | Admitting: Physician Assistant

## 2023-11-18 DIAGNOSIS — F200 Paranoid schizophrenia: Secondary | ICD-10-CM

## 2023-11-18 MED ORDER — HALOPERIDOL 10 MG PO TABS: 10.0000 mg | ORAL_TABLET | Freq: Two times a day (BID) | ORAL | 3 refills | Status: DC

## 2023-11-18 MED ORDER — BENZTROPINE MESYLATE 0.5 MG PO TABS: 0.5000 mg | ORAL_TABLET | Freq: Two times a day (BID) | ORAL | 3 refills | Status: DC

## 2023-11-18 MED ORDER — TRAZODONE HCL 50 MG PO TABS: 50.0000 mg | ORAL_TABLET | Freq: Every evening | ORAL | 3 refills | Status: DC | PRN

## 2023-11-18 MED ORDER — DIVALPROEX SODIUM 500 MG PO DR TAB: 500.0000 mg | DELAYED_RELEASE_TABLET | Freq: Two times a day (BID) | ORAL | 3 refills | Status: DC

## 2023-11-18 NOTE — Progress Notes (Signed)
 [facility] MD/PA/NP OP Progress Note  11/18/2023 11:00 AM WESTON KALLMAN  MRN:  [id number]  Chief Complaint:  Chief Complaint  Patient presents with   Follow-up   Medication Refill   HPI:   Skyy Mcknight. Menges is a 33 year old male with a past psychiatric history significant for paranoid schizophrenia who presents to [facility] for follow-up and medication management. Patient is currently being managed on the following psychiatric medications:  Depakote  500 mg DR 2 times daily Haldol  10 mg 2 times daily Benztropine  0.5 mg 2 times daily Trazodone  50 mg at bedtime as needed Paliperidone  (Invega  Sustenna) injection 156 mg/mL SUSY injection every 28 days  Patient reports that he continues to take his medications regularly.  Patient reports that he has been tolerating his medications well and denies experiencing any adverse side effects associated with the use of his current medication regimen.  Patient denies overt depressive symptoms nor does he endorse anxiety.  Patient denies changes in his behavior or paranoia.  He further denies auditory or visual hallucinations.  Patient denies any other issues or concerns at this time.  A PHQ-9 screen was performed with the patient scoring a 0. A GAD-7 screening was also performed with the patient scoring of 0.  Patient is alert and oriented x 4, calm, cooperative, and fully engaged in conversation during the encounter.  Patient endorses good mood.  Patient exhibits euthymic mood with appropriate affect.  Patient denies suicidal or homicidal ideations.  He further denies auditory or visual hallucinations and does not appear to be responding to internal/external stimuli.  Patient endorses good sleep and receives on average 8 to 9 hours of sleep per night.  Patient endorses good appetite and eats on average 3 meals per day.  Patient denies alcohol consumption, tobacco use, or illicit drug use.  Visit Diagnosis:     ICD-10-CM   1. Schizophrenia, paranoid (HCC)  F20.0 benztropine  (COGENTIN ) 0.5 MG tablet    traZODone  (DESYREL ) 50 MG tablet    divalproex  (DEPAKOTE ) 500 MG DR tablet    DISCONTINUED: haloperidol  (HALDOL ) 10 MG tablet      Past Psychiatric History:  Schizophrenia and depression   Past Medical History:  Past Medical History:  Diagnosis Date   Asthma    Eczema    Mood swings    History reviewed. No pertinent surgical history.  Family Psychiatric History:  Mother - Schizophrenia   Family History: History reviewed. No pertinent family history.  Social History:  Social History   Socioeconomic History   Marital status: Single    Spouse name: Not on file   Number of children: 0   Years of education: Not on file   Highest education level: High school graduate  Occupational History   Occupation: Social research officer, government     Comment: Museum/gallery curator  Tobacco Use   Smoking status: Every Day    Current packs/day: 0.50    Average packs/day: 0.5 packs/day for 13.8 years (6.9 ttl pk-yrs)    Types: Cigarettes    Start date: 03/02/2010   Smokeless tobacco: Never   Tobacco comments:    Reports down to 2 a day and trying to quit   Vaping Use   Vaping status: Never Used  Substance and Sexual Activity   Alcohol use: Not Currently    Comment: occassionl   Drug use: Not Currently    Types: Marijuana    Comment: Last used 04/2022   Sexual activity: Not Currently    Comment: Last  sexual encounter 04/2022  Other Topics Concern   Not on file  Social History Narrative   Not on file   Social Drivers of Health   Financial Resource Strain: Not on file  Food Insecurity: No Food Insecurity (12/06/2023)   Hunger Vital Sign    Worried About Running Out of Food in the Last Year: Never true    Ran Out of Food in the Last Year: Never true  Transportation Needs: No Transportation Needs (12/06/2023)   PRAPARE - Administrator, Civil Service (Medical): No    Lack of Transportation  (Non-Medical): No  Physical Activity: Not on file  Stress: Not on file  Social Connections: Socially Isolated (12/06/2023)   Social Connection and Isolation Panel    Frequency of Communication with Friends and Family: Once a week    Frequency of Social Gatherings with Friends and Family: Never    Attends Religious Services: Never    Database administrator or Organizations: No    Attends Banker Meetings: Never    Marital Status: Separated    Allergies:  Allergies  Allergen Reactions   Iodinated Contrast Media Other (See Comments)    Irritated skin   Shellfish Allergy Swelling   Peanut-Containing Drug Products Swelling    Metabolic Disorder Labs: Lab Results  Component Value Date   HGBA1C 4.9 12/06/2023   MPG 93.93 12/06/2023   MPG 105.41 06/10/2023   Lab Results  Component Value Date   PROLACTIN 20.2 01/20/2023   PROLACTIN 3.2 (L) 09/08/2021   Lab Results  Component Value Date   CHOL 169 12/06/2023   TRIG 34 12/06/2023   HDL 78 12/06/2023   CHOLHDL 2.2 12/06/2023   VLDL 7 12/06/2023   LDLCALC 84 12/06/2023   LDLCALC 74 06/10/2023   Lab Results  Component Value Date   TSH 1.873 12/06/2023   TSH 1.783 06/10/2023    Therapeutic Level Labs: No results found for: LITHIUM Lab Results  Component Value Date   VALPROATE 96 12/06/2023   No results found for: CBMZ  Current Medications: No current facility-administered medications for this visit.   No current outpatient medications on file.   Facility-Administered Medications Ordered in Other Visits  Medication Dose Route Frequency Provider Last Rate Last Admin   acetaminophen  (TYLENOL ) tablet 650 mg  650 mg Oral Q6H PRN White, Patrice L, NP       albuterol  (VENTOLIN  HFA) 108 (90 Base) MCG/ACT inhaler 1-2 puff  1-2 puff Inhalation Q6H PRN White, Patrice L, NP       alum & mag hydroxide-simeth (MAALOX/MYLANTA) 200-200-20 MG/5ML suspension 30 mL  30 mL Oral Q4H PRN White, Patrice L, NP        benztropine  (COGENTIN ) tablet 0.5 mg  0.5 mg Oral BID White, Patrice L, NP   0.5 mg at 12/07/23 1715   divalproex  (DEPAKOTE ) DR tablet 500 mg  500 mg Oral BID White, Patrice L, NP   500 mg at 12/07/23 1714   haloperidol  (HALDOL ) tablet 5 mg  5 mg Oral BID White, Patrice L, NP   5 mg at 12/07/23 1714   hydrOXYzine  (ATARAX ) tablet 25 mg  25 mg Oral TID PRN White, Patrice L, NP       magnesium  hydroxide (MILK OF MAGNESIA) suspension 30 mL  30 mL Oral Daily PRN White, Patrice L, NP       OLANZapine  (ZYPREXA ) injection 10 mg  10 mg Intramuscular TID PRN Teresa Wyline CROME, NP  OLANZapine  (ZYPREXA ) injection 5 mg  5 mg Intramuscular TID PRN White, Patrice L, NP       OLANZapine  zydis (ZYPREXA ) disintegrating tablet 5 mg  5 mg Oral TID PRN White, Patrice L, NP       traZODone  (DESYREL ) tablet 50 mg  50 mg Oral QHS PRN White, Patrice L, NP   50 mg at 12/07/23 2134     Musculoskeletal: Strength & Muscle Tone: within normal limits Gait & Station: normal Patient leans: N/A  Psychiatric Specialty Exam: Review of Systems  Psychiatric/Behavioral:  Negative for decreased concentration, dysphoric mood, hallucinations, self-injury, sleep disturbance and suicidal ideas. The patient is not nervous/anxious and is not hyperactive.     Blood pressure 131/87, pulse (!) 50, temperature (!) 97.5 F (36.4 C), temperature source Oral, height 6' 1 (1.854 m), weight 196 lb 6.4 oz (89.1 kg), SpO2 100%.Body mass index is 25.91 kg/m.  General Appearance: Well Groomed  Eye Contact:  Good  Speech:  Clear and Coherent and Normal Rate  Volume:  Normal  Mood:  Euthymic  Affect:  Appropriate  Thought Process:  Coherent, Goal Directed, and Linear  Orientation:  Full (Time, Place, and Person)  Thought Content: WDL   Suicidal Thoughts:  No  Homicidal Thoughts:  No  Memory:  Immediate;   Good Recent;   Good Remote;   Good  Judgement:  Good  Insight:  Good  Psychomotor Activity:  Normal  Concentration:   Concentration: Good and Attention Span: Good  Recall:  Good  Fund of Knowledge: Good  Language: Good  Akathisia:  No  Handed:  Right  AIMS (if indicated): done, WNL, 0  Assets:  Communication Skills Desire for Improvement Financial Resources/Insurance Housing Intimacy Leisure Time Physical Health Social Support  ADL's:  Intact  Cognition: WNL  Sleep:  Good   Screenings: AIMS    Flowsheet Row Clinical Support from 11/18/2023 in [facility] Clinical Support from 09/16/2023 in [facility] Clinical Support from 02/16/2023 in [facility] Office Visit from 10/20/2022 in [facility] Office Visit from 06/18/2022 in [facility]  AIMS Total Score 0 0 0 1 0   AUDIT    Flowsheet Row Video Visit from 01/20/2023 in [facility] Admission (Discharged[facility]  Alcohol Use Disorder Identification Test Final Score (AUDIT) 12 4   GAD-7    Flowsheet Row Clinical Support from 11/18/2023 in [facility] Clinical Support from 09/16/2023 in [facility] Clinical Support from 07/22/2023 in [facility] Clinical Support from 02/16/2023 in [facility] Video Visit from 01/20/2023 in [facility]  Total GAD-7 Score 0 2 1 0 18   PHQ2-9    Flowsheet Row Clinical Support from 11/18/2023 in [facility] Clinical Support from 09/16/2023 in [facility] Clinical Support from 07/22/2023 in [facility] Clinical Support from 02/16/2023 in [facility] Video Visit from 01/20/2023 in [facility]  PHQ-2 Total Score 0 0 0 0 2   PHQ-9 Total Score -- -- 3 0 13   Flowsheet Row Clinical Support from 11/18/2023 in [facility] UC from 11/01/2023 in [facility] UC from 09/24/2023 in [facility]  RISK CATEGORY No Risk No Risk No Risk     Assessment and Plan:   German L. Carvalho is a 33 year old male with a past psychiatric history significant for paranoid schizophrenia who presents to [facility] for follow-up and medication management.  Patient presents to the encounter stating that he has been taking his medications regularly and denies experiencing any adverse side effects at this time.  An aims assessment was performed with the patient scoring a 0.  Patient denies overt depressive symptoms nor does he endorse anxiety at this time.  A PHQ-9 screening was performed with the patient scoring a 0.  A GAD-7 screen was also performed with the patient scoring a 0.  Patient denies any changes in his behavior nor does he endorse paranoia.  Patient does not appear to be responding to internal/external stimuli at the time of this encounter.  Patient endorses stability on his current medication regimen and would like to continue taking his medications as prescribed.  Patient's medications to be e-prescribed to pharmacy of choice.  A [place] Suicide Severity Rating Scale was performed with the patient being considered no risk.  Patient denies suicidal ideations and is able to contract for safety at this time.   Patient's labs and EKG are up-to-date.  Collaboration of Care: Collaboration of Care: Medication Management AEB provider managing patient's psychiatric medications and Psychiatrist AEB patient being followed by the mental health provider at this facility  Patient/Guardian was advised Release of Information must be obtained prior to any record release in order to collaborate their care with an outside  provider. Patient/Guardian was advised if they have not already done so to contact the registration department to sign all necessary forms in order for us  to release information regarding their care.   Consent: Patient/Guardian gives verbal consent for treatment and assignment of benefits for services provided during this visit. Patient/Guardian expressed understanding and agreed to proceed.   1. Schizophrenia, paranoid (HCC) Patient to continue receiving Invega  Sustenna 156 mg/mL injection every 28 days for the management of his schizophrenia (paranoid type)   - benztropine  (COGENTIN ) 0.5 MG tablet; Take 1 tablet (0.5 mg total) by mouth 2 (two) times daily.  Dispense: 60 tablet; Refill: 3 - traZODone  (DESYREL ) 50 MG tablet; Take 1 tablet (50 mg total) by mouth at bedtime as needed for sleep. (Patient taking differently: Take 50 mg by mouth at bedtime.)  Dispense: 30 tablet; Refill: 3 - divalproex  (DEPAKOTE ) 500 MG DR tablet; Take 1 tablet (500 mg total) by mouth 2 (two) times daily.  Dispense: 60 tablet; Refill: 3 - haloperidol  (HALDOL ) 10 mg tablet; Take 1 tablet (10 mg total) by mouth 2 (two) times daily.  Dispense: 60 tablet; Refill: 3  Patient to follow up in 5 weeks Patient's next injection is scheduled for 11/25/2023 at [facility] Provider spent a total of 20 minutes with the patient/reviewing patient's chart  Reginia FORBES Bolster, PA 11/18/2023, 11:00 AM

## 2023-11-25 ENCOUNTER — Encounter (HOSPITAL_COMMUNITY): Payer: Self-pay

## 2023-11-25 ENCOUNTER — Ambulatory Visit (INDEPENDENT_AMBULATORY_CARE_PROVIDER_SITE_OTHER): Payer: MEDICAID

## 2023-11-25 VITALS — BP 119/70 | HR 68 | Ht 73.0 in | Wt 195.6 lb

## 2023-11-25 DIAGNOSIS — F250 Schizoaffective disorder, bipolar type: Secondary | ICD-10-CM

## 2023-11-25 NOTE — Progress Notes (Cosign Needed)
 Patient presented to office for injection of Invega  156 mg. Patient was provided a sample as pharmacy did not ship medications to office. Patient denies SI/HI/AVH. Patient received injection in right deltoid per pt request. Patient tolerated injection well. Patient will return in 28 days. No additional concerns noted.    NDC 49541-436-96 LOT [id number] EXP 01/20/2025

## 2023-12-06 ENCOUNTER — Inpatient Hospital Stay
Admission: AD | Admit: 2023-12-06 | Discharge: 2023-12-14 | DRG: 885 | Disposition: A | Discharge status: Home / Self Care | Payer: MEDICAID | Source: Intra-hospital | Attending: Psychiatry | Admitting: Psychiatry

## 2023-12-06 ENCOUNTER — Encounter: Payer: Self-pay | Admitting: Behavioral Health

## 2023-12-06 ENCOUNTER — Ambulatory Visit (HOSPITAL_COMMUNITY)
Admission: EM | Admit: 2023-12-06 | Discharge: 2023-12-06 | Disposition: A | Discharge status: Other Acute Inpatient Hospital | Payer: MEDICAID | Attending: Behavioral Health | Admitting: Behavioral Health

## 2023-12-06 ENCOUNTER — Other Ambulatory Visit: Payer: Self-pay

## 2023-12-06 DIAGNOSIS — F259 Schizoaffective disorder, unspecified: Secondary | ICD-10-CM

## 2023-12-06 DIAGNOSIS — Z91148 Patient's other noncompliance with medication regimen for other reason: Secondary | ICD-10-CM | POA: Diagnosis not present

## 2023-12-06 DIAGNOSIS — Z818 Family history of other mental and behavioral disorders: Secondary | ICD-10-CM

## 2023-12-06 DIAGNOSIS — F17210 Nicotine dependence, cigarettes, uncomplicated: Secondary | ICD-10-CM | POA: Diagnosis present

## 2023-12-06 DIAGNOSIS — J45909 Unspecified asthma, uncomplicated: Secondary | ICD-10-CM | POA: Diagnosis present

## 2023-12-06 DIAGNOSIS — Z91013 Allergy to seafood: Secondary | ICD-10-CM | POA: Diagnosis not present

## 2023-12-06 DIAGNOSIS — Z91010 Allergy to peanuts: Secondary | ICD-10-CM | POA: Diagnosis not present

## 2023-12-06 DIAGNOSIS — Z91128 Patient's intentional underdosing of medication regimen for other reason: Secondary | ICD-10-CM | POA: Diagnosis not present

## 2023-12-06 DIAGNOSIS — Z8709 Personal history of other diseases of the respiratory system: Secondary | ICD-10-CM

## 2023-12-06 DIAGNOSIS — Z555 Less than a high school diploma: Secondary | ICD-10-CM

## 2023-12-06 DIAGNOSIS — R45851 Suicidal ideations: Secondary | ICD-10-CM | POA: Diagnosis present

## 2023-12-06 DIAGNOSIS — F419 Anxiety disorder, unspecified: Secondary | ICD-10-CM | POA: Diagnosis present

## 2023-12-06 DIAGNOSIS — R569 Unspecified convulsions: Secondary | ICD-10-CM | POA: Insufficient documentation

## 2023-12-06 DIAGNOSIS — F250 Schizoaffective disorder, bipolar type: Secondary | ICD-10-CM | POA: Insufficient documentation

## 2023-12-06 DIAGNOSIS — Z9151 Personal history of suicidal behavior: Secondary | ICD-10-CM | POA: Diagnosis not present

## 2023-12-06 DIAGNOSIS — Z79899 Other long term (current) drug therapy: Secondary | ICD-10-CM | POA: Insufficient documentation

## 2023-12-06 DIAGNOSIS — F515 Nightmare disorder: Secondary | ICD-10-CM | POA: Diagnosis present

## 2023-12-06 DIAGNOSIS — Z91041 Radiographic dye allergy status: Secondary | ICD-10-CM | POA: Diagnosis not present

## 2023-12-06 DIAGNOSIS — F200 Paranoid schizophrenia: Secondary | ICD-10-CM

## 2023-12-06 LAB — COMPREHENSIVE METABOLIC PANEL WITH GFR
ALT: 23 U/L (ref 0–44)
AST: 26 U/L (ref 15–41)
Albumin: 4 g/dL (ref 3.5–5.0)
Alkaline Phosphatase: 50 U/L (ref 38–126)
Anion gap: 13 (ref 5–15)
BUN: 8 mg/dL (ref 6–20)
CO2: 25 mmol/L (ref 22–32)
Calcium: 9.5 mg/dL (ref 8.9–10.3)
Chloride: 102 mmol/L (ref 98–111)
Creatinine, Ser: 1.08 mg/dL (ref 0.61–1.24)
GFR, Estimated: >60 mL/min (ref >60)
Glucose, Bld: 84 mg/dL (ref 70–99)
Potassium: 4.3 mmol/L (ref 3.5–5.1)
Sodium: 140 mmol/L (ref 135–145)
Total Bilirubin: 0.6 mg/dL (ref 0.0–1.2)
Total Protein: 6.7 g/dL (ref 6.5–8.1)

## 2023-12-06 LAB — CBC WITH DIFFERENTIAL/PLATELET
Abs Immature Granulocytes: 0.01 K/uL (ref 0.00–0.07)
Basophils Absolute: 0 K/uL (ref 0.0–0.1)
Basophils Relative: 0 %
Eosinophils Absolute: 0.2 K/uL (ref 0.0–0.5)
Eosinophils Relative: 7 %
HCT: 39.2 % (ref 39.0–52.0)
Hemoglobin: 13.2 g/dL (ref 13.0–17.0)
Immature Granulocytes: 0 %
Lymphocytes Relative: 25 %
Lymphs Abs: 0.8 K/uL (ref 0.7–4.0)
MCH: 30 pg (ref 26.0–34.0)
MCHC: 33.7 g/dL (ref 30.0–36.0)
MCV: 89.1 fL (ref 80.0–100.0)
Monocytes Absolute: 0.3 K/uL (ref 0.1–1.0)
Monocytes Relative: 9 %
Neutro Abs: 1.8 K/uL (ref 1.7–7.7)
Neutrophils Relative %: 59 %
Platelets: 203 K/uL (ref 150–400)
RBC: 4.4 MIL/uL (ref 4.22–5.81)
RDW: 12.1 % (ref 11.5–15.5)
WBC: 3 K/uL — ABNORMAL LOW (ref 4.0–10.5)
nRBC: 0 % (ref 0.0–0.2)

## 2023-12-06 LAB — POCT URINE DRUG SCREEN - MANUAL ENTRY (I-SCREEN)
POC Amphetamine UR: NOT DETECTED
POC Buprenorphine (BUP): NOT DETECTED
POC Cocaine UR: NOT DETECTED
POC Marijuana UR: NOT DETECTED
POC Methadone UR: NOT DETECTED
POC Methamphetamine UR: NOT DETECTED
POC Morphine: NOT DETECTED
POC Oxazepam (BZO): NOT DETECTED
POC Oxycodone UR: NOT DETECTED
POC Secobarbital (BAR): NOT DETECTED

## 2023-12-06 LAB — LIPID PANEL
Cholesterol: 169 mg/dL (ref 0–200)
HDL: 78 mg/dL (ref >40)
LDL Cholesterol: 84 mg/dL (ref 0–99)
Total CHOL/HDL Ratio: 2.2 ratio
Triglycerides: 34 mg/dL (ref <150)
VLDL: 7 mg/dL (ref 0–40)

## 2023-12-06 LAB — HEMOGLOBIN A1C
Hgb A1c MFr Bld: 4.9 % (ref 4.8–5.6)
Mean Plasma Glucose: 93.93 mg/dL

## 2023-12-06 LAB — TSH: TSH: 1.873 u[IU]/mL (ref 0.350–4.500)

## 2023-12-06 LAB — VALPROIC ACID LEVEL: Valproic Acid Lvl: 96 ug/mL (ref 50–100)

## 2023-12-06 LAB — HIV ANTIBODY (ROUTINE TESTING W REFLEX): HIV Screen 4th Generation wRfx: NONREACTIVE

## 2023-12-06 LAB — HEPATITIS PANEL, ACUTE
HCV Ab: NONREACTIVE
Hep A IgM: NONREACTIVE
Hep B C IgM: NONREACTIVE
Hepatitis B Surface Ag: NONREACTIVE

## 2023-12-06 LAB — ETHANOL: Alcohol, Ethyl (B): <15 mg/dL (ref <15)

## 2023-12-06 MED ORDER — ALBUTEROL SULFATE HFA 108 (90 BASE) MCG/ACT IN AERS: 1.0000 | INHALATION_SPRAY | Freq: Four times a day (QID) | RESPIRATORY_TRACT | Status: DC | PRN

## 2023-12-06 MED ORDER — HYDROXYZINE HCL 25 MG PO TABS: 25.0000 mg | ORAL_TABLET | Freq: Three times a day (TID) | ORAL | Status: DC | PRN

## 2023-12-06 MED ORDER — HYDROXYZINE HCL 25 MG PO TABS
25.0000 mg | ORAL_TABLET | Freq: Three times a day (TID) | ORAL | Status: DC | PRN
  Administered 2023-12-10: 25 mg via ORAL
  Filled 2023-12-06: qty 1

## 2023-12-06 MED ORDER — HALOPERIDOL 5 MG PO TABS: 5.0000 mg | ORAL_TABLET | Freq: Three times a day (TID) | ORAL | Status: DC | PRN

## 2023-12-06 MED ORDER — MAGNESIUM HYDROXIDE 400 MG/5ML PO SUSP: 30.0000 mL | Freq: Every day | ORAL | Status: DC | PRN

## 2023-12-06 MED ORDER — TRAZODONE HCL 50 MG PO TABS: 50.0000 mg | ORAL_TABLET | Freq: Every evening | ORAL | Status: DC | PRN

## 2023-12-06 MED ORDER — DIPHENHYDRAMINE HCL 50 MG/ML IJ SOLN: 50.0000 mg | Freq: Three times a day (TID) | INTRAMUSCULAR | Status: DC | PRN

## 2023-12-06 MED ORDER — MAGNESIUM HYDROXIDE 400 MG/5ML PO SUSP
30.0000 mL | Freq: Every day | ORAL | Status: DC | PRN
  Administered 2023-12-12: 30 mL via ORAL
  Filled 2023-12-06: qty 30

## 2023-12-06 MED ORDER — HALOPERIDOL LACTATE 5 MG/ML IJ SOLN: 10.0000 mg | Freq: Three times a day (TID) | INTRAMUSCULAR | Status: DC | PRN

## 2023-12-06 MED ORDER — HALOPERIDOL LACTATE 5 MG/ML IJ SOLN: 5.0000 mg | Freq: Three times a day (TID) | INTRAMUSCULAR | Status: DC | PRN

## 2023-12-06 MED ORDER — BENZTROPINE MESYLATE 1 MG PO TABS
0.5000 mg | ORAL_TABLET | Freq: Two times a day (BID) | ORAL | Status: DC
  Administered 2023-12-07 – 2023-12-14 (×15): 0.5 mg via ORAL
  Filled 2023-12-06 (×15): qty 1

## 2023-12-06 MED ORDER — TRAZODONE HCL 50 MG PO TABS
50.0000 mg | ORAL_TABLET | Freq: Every evening | ORAL | Status: DC | PRN
  Administered 2023-12-07 – 2023-12-11 (×4): 50 mg via ORAL
  Filled 2023-12-06 (×4): qty 1

## 2023-12-06 MED ORDER — ALUM & MAG HYDROXIDE-SIMETH 200-200-20 MG/5ML PO SUSP: 30.0000 mL | ORAL | Status: DC | PRN

## 2023-12-06 MED ORDER — ACETAMINOPHEN 325 MG PO TABS: 650.0000 mg | ORAL_TABLET | Freq: Four times a day (QID) | ORAL | Status: DC | PRN

## 2023-12-06 MED ORDER — DIVALPROEX SODIUM 500 MG PO DR TAB
500.0000 mg | DELAYED_RELEASE_TABLET | Freq: Two times a day (BID) | ORAL | Status: DC
  Administered 2023-12-06: 500 mg via ORAL
  Filled 2023-12-06: qty 1

## 2023-12-06 MED ORDER — OLANZAPINE 10 MG IM SOLR: 10.0000 mg | Freq: Three times a day (TID) | INTRAMUSCULAR | Status: DC | PRN

## 2023-12-06 MED ORDER — OLANZAPINE 10 MG IM SOLR: 5.0000 mg | Freq: Three times a day (TID) | INTRAMUSCULAR | Status: DC | PRN

## 2023-12-06 MED ORDER — HALOPERIDOL 5 MG PO TABS
5.0000 mg | ORAL_TABLET | Freq: Two times a day (BID) | ORAL | Status: DC
  Administered 2023-12-06 (×2): 5 mg via ORAL
  Filled 2023-12-06 (×2): qty 1

## 2023-12-06 MED ORDER — OLANZAPINE 5 MG PO TBDP: 5.0000 mg | ORAL_TABLET | Freq: Three times a day (TID) | ORAL | Status: DC | PRN

## 2023-12-06 MED ORDER — DIPHENHYDRAMINE HCL 50 MG PO CAPS: 50.0000 mg | ORAL_CAPSULE | Freq: Three times a day (TID) | ORAL | Status: DC | PRN

## 2023-12-06 MED ORDER — ALBUTEROL SULFATE HFA 108 (90 BASE) MCG/ACT IN AERS
1.0000 | INHALATION_SPRAY | Freq: Four times a day (QID) | RESPIRATORY_TRACT | Status: DC | PRN
Start: 2023-12-06 — End: 2023-12-06

## 2023-12-06 MED ORDER — HALOPERIDOL 5 MG PO TABS: 5.0000 mg | ORAL_TABLET | Freq: Two times a day (BID) | ORAL | Status: DC

## 2023-12-06 MED ORDER — BENZTROPINE MESYLATE 0.5 MG PO TABS
0.5000 mg | ORAL_TABLET | Freq: Two times a day (BID) | ORAL | Status: DC
  Administered 2023-12-06 (×2): 0.5 mg via ORAL
  Filled 2023-12-06 (×2): qty 1

## 2023-12-06 MED ORDER — DIVALPROEX SODIUM 500 MG PO DR TAB
500.0000 mg | DELAYED_RELEASE_TABLET | Freq: Two times a day (BID) | ORAL | Status: DC
  Administered 2023-12-07 – 2023-12-14 (×15): 500 mg via ORAL
  Filled 2023-12-06 (×15): qty 1

## 2023-12-06 MED ORDER — HALOPERIDOL 5 MG PO TABS
5.0000 mg | ORAL_TABLET | Freq: Two times a day (BID) | ORAL | Status: DC
  Administered 2023-12-07 – 2023-12-14 (×15): 5 mg via ORAL
  Filled 2023-12-06 (×15): qty 1

## 2023-12-06 MED ORDER — LORAZEPAM 2 MG/ML IJ SOLN: 2.0000 mg | Freq: Three times a day (TID) | INTRAMUSCULAR | Status: DC | PRN
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
## 2023-12-06 NOTE — ED Notes (Signed)
 BMU 9/15

## 2023-12-06 NOTE — Progress Notes (Signed)
 Patient admitted from [facility], report received from [place], [place]. Pt presents as responding to internal stimuli. Pt denies SI/HI/AVH currently. Pt oriented to the unit and his room. Skin assessment completed with Clarita, RN, Pt has discolored patches of skin bilateral hands and feet. Pt oriented to the unit and his room. Pt being monitored Q 15 minutes for safety per unit protocol, remains safe on the unit

## 2023-12-06 NOTE — ED Notes (Signed)
 Pt indicates he is hearing voices.

## 2023-12-06 NOTE — Progress Notes (Signed)
 Meal provided

## 2023-12-06 NOTE — Plan of Care (Signed)
 Pt new to the unit tonight, hasn't had time to progress  Problem: Education: Goal: Knowledge of [place] Education information/materials will improve Outcome: Not Progressing Goal: Emotional status will improve Outcome: Not Progressing Goal: Mental status will improve Outcome: Not Progressing Goal: Verbalization of understanding the information provided will improve Outcome: Not Progressing   Problem: Activity: Goal: Interest or engagement in activities will improve Outcome: Not Progressing Goal: Sleeping patterns will improve Outcome: Not Progressing   Problem: Coping: Goal: Ability to verbalize frustrations and anger appropriately will improve Outcome: Not Progressing Goal: Ability to demonstrate self-control will improve Outcome: Not Progressing   Problem: Health Behavior/Discharge Planning: Goal: Identification of resources available to assist in meeting health care needs will improve Outcome: Not Progressing Goal: Compliance with treatment plan for underlying cause of condition will improve Outcome: Not Progressing   Problem: Physical Regulation: Goal: Ability to maintain clinical measurements within normal limits will improve Outcome: Not Progressing   Problem: Safety: Goal: Periods of time without injury will increase Outcome: Not Progressing
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
## 2023-12-06 NOTE — ED Notes (Addendum)
 Patient admitted to observation unit for schizophrenia. Calm, cooperative throughout interview process. Skin assessment completed. Oriented to unit. Meal and drink offered. Patient alert & oriented x4. Denies intent to harm self or others when asked, does endorse SI w/ plan prior to arrival (cut throat with a knife, stab self, or shoot self, denies currently). Endorses AH (unintelligible voices), demands command type, denies VH. Patient denies any physical complaints when asked. No acute distress noted. Support and encouragement provided. Routine safety checks conducted per facility protocol. Encouraged patient to notify staff if any thoughts of harm towards self or others arise. Patient verbalizes understanding and agreement.

## 2023-12-06 NOTE — ED Notes (Signed)
 Patient resting in lounger with eyes closed, respirations even and unlabored. Patient in no apparent acute distress. Environment secured. Safety checks in place per facility protocol.

## 2023-12-06 NOTE — ED Notes (Signed)
 Report called to RN Deland, [facility].  Librarian, academic.

## 2023-12-06 NOTE — Tx Team (Signed)
 Initial Treatment Plan 12/06/2023 11:38 PM Larry Simmons [id number]    PATIENT STRESSORS: Loss of relationship     PATIENT STRENGTHS: Communication skills  General fund of knowledge    PATIENT IDENTIFIED PROBLEMS: Passive SI  Auditory Hallucinations  Relationship                 DISCHARGE CRITERIA:  Improved stabilization in mood, thinking, and/or behavior  PRELIMINARY DISCHARGE PLAN: Outpatient therapy  PATIENT/FAMILY INVOLVEMENT: This treatment plan has been presented to and reviewed with the patient, Larry Simmons, and/or family member,  .  The patient and family have been given the opportunity to ask questions and make suggestions.  Daiki Hoose, RN 12/06/2023, 11:38 PM

## 2023-12-06 NOTE — Discharge Instructions (Addendum)
 Patient accepted to [facility] today, 12/06/23

## 2023-12-06 NOTE — ED Notes (Signed)
 Safe Transport requested.

## 2023-12-07 ENCOUNTER — Encounter (HOSPITAL_COMMUNITY): Payer: Self-pay | Admitting: Physician Assistant

## 2023-12-07 DIAGNOSIS — F250 Schizoaffective disorder, bipolar type: Secondary | ICD-10-CM | POA: Diagnosis not present

## 2023-12-07 LAB — RPR: RPR Ser Ql: NONREACTIVE

## 2023-12-07 LAB — GC/CHLAMYDIA PROBE AMP (~~LOC~~) NOT AT ARMC
Chlamydia: NEGATIVE
Comment: NEGATIVE
Comment: NORMAL
Neisseria Gonorrhea: NEGATIVE
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
## 2023-12-07 NOTE — Group Note (Signed)
 Date:  12/07/2023 Time:  5:49 PM  Group Topic/Focus:  Wellness Toolbox:   The focus of this group is to discuss various aspects of wellness, balancing those aspects and exploring ways to increase the ability to experience wellness.  Patients will create a wellness toolbox for use upon discharge.    Participation Level:  Did Not Attend   Deitra Clap [facility] 12/07/2023, 5:49 PM

## 2023-12-07 NOTE — Group Note (Signed)
 Date:  12/07/2023 Time:  9:07 PM  Group Topic/Focus:  Spirituality:   The focus of this group is to discuss how one's spirituality can aide in recovery.    Participation Level:  Did Not Attend  Participation Quality:  none  Affect:  none  Cognitive:  none  Insight: None  Engagement in Group:  none  Modes of Intervention:  none  Additional Comments:  none   Karrine Kluttz 12/07/2023, 9:07 PM

## 2023-12-07 NOTE — Plan of Care (Signed)
  Problem: Education: Goal: Knowledge of [facility] Education information/materials will improve Outcome: Progressing Goal: Mental status will improve Outcome: Progressing   Problem: Activity: Goal: Interest or engagement in activities will improve Outcome: Progressing Goal: Sleeping patterns will improve Outcome: Progressing

## 2023-12-07 NOTE — Group Note (Signed)
 Recreation Therapy Group Note   Group Topic:Emotion Expression  Group Date: 12/07/2023 Start Time: 1000 End Time: 1055 Facilitators: Celestia Jeoffrey BRAVO, LRT, CTRS Location: Craft Room  Group Description: Painting a [employer] operational officer. Patients and LRT discuss what it means to be "at peace", what it feels like physically and mentally. Pts are given a canvas and watercolor paint to use and encouraged to draw their idea of a peaceful place. Pts and LRT discuss how they use this in their daily life post discharge. Pts are encouraged to take their canvas home with them as a reminder to find their peaceful place whenever they are feeling depressed, anxious, etc.    Goal Area(s) Addressed:  Patient will identify what it means to experience a "peaceful" emotion. Patient will identify a new coping skill.  Patient will express their emotions through art. Patients will increase communication by talking with LRT and peers while in group.   Affect/Mood: Appropriate   Participation Level: Active and Engaged   Participation Quality: Independent   Behavior: Bizarre and Cooperative   Speech/Thought Process: Loose association   Insight: Fair   Judgement: Fair    Modes of Intervention: Art   Patient Response to Interventions:  Receptive   Education Outcome:  Acknowledges education   Clinical Observations/Individualized Feedback: Larry Simmons was active in their participation of session activities and group discussion. Pt identified I painted my soul. The bright colors show that there is happiness in there and the dark color represent the darkness inside of me. Pt randomly blurted out: I just love all of y'all, in the middle of group. Pt minimally interacted with LRT and peers while present.    Plan: Continue to engage patient in RT group sessions 2-3x/week.   Jeoffrey BRAVO Celestia, LRT, CTRS 12/07/2023 1:10 PM

## 2023-12-07 NOTE — [facility] Suicide Risk Assessment (Signed)
 [facility] Admission Suicide Risk Assessment   Nursing information obtained from:  Patient Demographic factors:  Male, Low socioeconomic status Current Mental Status:  Suicidal ideation indicated by others Loss Factors:  Loss of significant relationship (break up with G/F) Historical Factors:  Impulsivity Risk Reduction Factors:  Positive therapeutic relationship  Total Time spent with patient: 30 minutes Principal Problem: Schizoaffective disorder (HCC) Diagnosis:  Principal Problem:   Schizoaffective disorder (HCC)  Subjective Data: Larry Simmons is a 33 y.o. male patient with a documented history significant for schizoaffective disorder, bipolar type and a medical history of asthma and eczema who presented to the [facility] accompanied by a staff member from the [facility] with complaints of having problems with his ex-girlfriend and thoughts to harm himself and his ex-girlfriend. Patient states that he told the guy at the [facility] today that he wanted to come here [facility]) because he needs help. He states that he needs help because he has been thinking about life, God, his ex-girlfriend and the devil. He states that when he thinks about God, he thinks about finding a new girlfriend and when he thinks about the devil he think about hurting his ex-girlfriend and himself. He states that the thoughts to harm himself and his ex-girlfriend started today. When asked if he has a plan on how he would hurt himself or his ex-girlfriend he states, I would hurt her by hand and I do not care how I hurt myself, I could jump off a bridge for a cliff. Patient is admitted to adult psych unit with Q15 min safety monitoring. Multidisciplinary team approach is offered. Medication management; group/milieu therapy is offered.   Continued Clinical Symptoms:  Alcohol Use Disorder Identification Test Final Score (AUDIT): 0 The Alcohol Use Disorders Identification Test,  Guidelines for Use in Primary Care, Second Edition.  World Science writer [facility]). Score between 0-7:  no or low risk or alcohol related problems. Score between 8-15:  moderate risk of alcohol related problems. Score between 16-19:  high risk of alcohol related problems. Score 20 or above:  warrants further diagnostic evaluation for alcohol dependence and treatment.   CLINICAL FACTORS:   Schizophrenia:   Paranoid or undifferentiated type   Musculoskeletal: Strength & Muscle Tone: within normal limits Gait & Station: normal Patient leans: N/A  Psychiatric Specialty Exam:  Presentation  General Appearance:  Appropriate for Environment  Eye Contact: Fair  Speech: Slow  Speech Volume: Decreased  Handedness: Right   Mood and Affect  Mood: Dysphoric  Affect: Congruent   Thought Process  Thought Processes: Irrevelant; Disorganized  Descriptions of Associations:Circumstantial  Orientation:Full (Time, Place and Person)  Thought Content:Illogical  History of Schizophrenia/Schizoaffective disorder:Yes  Duration of Psychotic Symptoms:Greater than six months  Hallucinations:Hallucinations: None  Ideas of Reference:None  Suicidal Thoughts:Suicidal Thoughts: Yes, Passive  Homicidal Thoughts:Homicidal Thoughts: Yes, Passive   Sensorium  Memory: Immediate Fair; Recent Poor; Remote Poor  Judgment: Intact  Insight: Present; Shallow   Executive Functions  Concentration: Fair  Attention Span: Fair  Recall: Poor  Fund of Knowledge: Fair  Language: Fair   Psychomotor Activity  Psychomotor Activity: Psychomotor Activity: Restlessness   Assets  Assets: Communication Skills; Desire for Improvement; Social Support; Housing   Sleep  Sleep: Sleep: Fair Number of Hours of Sleep: 7    Physical Exam: Physical Exam Vitals and nursing note reviewed.    ROS Blood pressure (!) 125/55, pulse (!) 59, temperature 98 F (36.7 C),  temperature source Oral, resp.  rate 16, height 6' 1 (1.854 m), weight 88.7 kg, SpO2 100%. Body mass index is 25.81 kg/m.   COGNITIVE FEATURES THAT CONTRIBUTE TO RISK:  None    SUICIDE RISK:   Minimal: No identifiable suicidal ideation.  Patients presenting with no risk factors but with morbid ruminations; may be classified as minimal risk based on the severity of the depressive symptoms  PLAN OF CARE: Patient is admitted to adult psych unit with Q15 min safety monitoring. Multidisciplinary team approach is offered. Medication management; group/milieu therapy is offered.   I certify that inpatient services furnished can reasonably be expected to improve the patient's condition.   Allyn Foil, MD 12/07/2023, 10:10 PM
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
## 2023-12-07 NOTE — Progress Notes (Signed)
 Pt calm and pleasant during assessment denying SI/HI/AVH. Pt stated he had a good day and was glad to be here getting help. Pt observed by this Clinical research associate interacting appropriately with staff and peers on the unit. Pt compliant with medication administration per MD orders. Pt given education, support, and encouragement to be active in his treatment plan. Pt being monitored Q 15 minutes for safety per unit protocol, remains safe on the unit

## 2023-12-07 NOTE — Group Note (Signed)
 Date:  12/07/2023 Time:  1:59 PM  Group Topic/Focus:  Making Healthy Choices:   The focus of this group is to help patients identify negative/unhealthy choices they were using prior to admission and identify positive/healthier coping strategies to replace them upon discharge.    Participation Level:  Active  Participation Quality:  Appropriate  Affect:  Appropriate  Cognitive:  Appropriate  Insight: Appropriate  Engagement in Group:  Engaged  Modes of Intervention:  Activity  Additional Comments:    Larry Simmons Hong Moring 12/07/2023, 1:59 PM

## 2023-12-07 NOTE — Progress Notes (Signed)
   12/07/23 1000  Psych Admission Type (Psych Patients Only)  Admission Status Voluntary  Psychosocial Assessment  Patient Complaints Substance abuse;None  Eye Contact Fair  Facial Expression Other (Comment) (WNL)  Affect Anxious  Speech Soft  Interaction Cautious  Motor Activity Slow  Appearance/Hygiene Unremarkable  Behavior Characteristics Cooperative  Mood Anxious;Pleasant  Thought Process  Coherency Concrete thinking  Content Preoccupation  Delusions None reported or observed  Perception Derealization  Hallucination None reported or observed  Judgment Impaired  Confusion None  Danger to Self  Current suicidal ideation? Denies  Agreement Not to Harm Self Yes  Description of Agreement verbal  Danger to Others  Danger to Others None reported or observed

## 2023-12-08 DIAGNOSIS — F250 Schizoaffective disorder, bipolar type: Secondary | ICD-10-CM | POA: Diagnosis not present

## 2023-12-08 NOTE — Plan of Care (Signed)
  Problem: Education: Goal: Emotional status will improve Outcome: Progressing   Problem: Education: Goal: Mental status will improve Outcome: Progressing   Problem: Activity: Goal: Interest or engagement in activities will improve Outcome: Progressing Goal: Sleeping patterns will improve Outcome: Progressing   Problem: Activity: Goal: Sleeping patterns will improve Outcome: Progressing   Problem: Coping: Goal: Ability to verbalize frustrations and anger appropriately will improve Outcome: Progressing Goal: Ability to demonstrate self-control will improve Outcome: Progressing   Problem: Safety: Goal: Periods of time without injury will increase Outcome: Progressing

## 2023-12-08 NOTE — Progress Notes (Signed)
 [facility] MD Progress Note  12/08/2023 3:33 PM Larry Simmons  MRN:  [id number]  Larry Simmons is a 33 y.o. male patient with a documented history significant for schizoaffective disorder, bipolar type and a medical history of asthma and eczema who presented to the [facility] accompanied by a staff member from the [facility] with complaints of having problems with his ex-girlfriend and thoughts to harm himself and his ex-girlfriend.Patient is admitted to adult psych unit with Q15 min safety monitoring. Multidisciplinary team approach is offered. Medication management; group/milieu therapy is offered.  Subjective:  Chart reviewed, case discussed in multidisciplinary meeting, patient seen during rounds.  Patient met with the treatment team today.  When asked about the goals of the treatment he said he wants to have a girl .  When asked about his conflict with the ex-girlfriend and the threats of SI HI he made in the ED patient reports that he has no intention of hurting his ex-girlfriend he was just upset about her manipulation.  Patient denies current SI/HI/intent/plan.  He reports having fair appetite and sleep.  He denies any auditory/visual hallucinations.  Per nursing patient is participating in the groups and displaying safe behaviors on the unit.  Patient is taking his medications and denies having any side effects of EPS   Sleep: Fair  Appetite:  Fair  Past Psychiatric History: see h&P Family History: History reviewed. No pertinent family history. Social History:  Social History   Substance and Sexual Activity  Alcohol Use Not Currently   Comment: occassionl     Social History   Substance and Sexual Activity  Drug Use Not Currently   Types: Marijuana   Comment: Last used 04/2022    Social History   Socioeconomic History   Marital status: Single    Spouse name: Not on file   Number of children: 0   Years of education: Not on file    Highest education level: High school graduate  Occupational History   Occupation: Social research officer, government     Comment: Museum/gallery curator  Tobacco Use   Smoking status: Every Day    Current packs/day: 0.50    Average packs/day: 0.5 packs/day for 13.8 years (6.9 ttl pk-yrs)    Types: Cigarettes    Start date: 03/02/2010   Smokeless tobacco: Never   Tobacco comments:    Reports down to 2 a day and trying to quit   Vaping Use   Vaping status: Never Used  Substance and Sexual Activity   Alcohol use: Not Currently    Comment: occassionl   Drug use: Not Currently    Types: Marijuana    Comment: Last used 04/2022   Sexual activity: Not Currently    Comment: Last sexual encounter 04/2022  Other Topics Concern   Not on file  Social History Narrative   Not on file   Social Drivers of Health   Financial Resource Strain: Not on file  Food Insecurity: No Food Insecurity (12/06/2023)   Hunger Vital Sign    Worried About Running Out of Food in the Last Year: Never true    Ran Out of Food in the Last Year: Never true  Transportation Needs: No Transportation Needs (12/06/2023)   PRAPARE - Administrator, Civil Service (Medical): No    Lack of Transportation (Non-Medical): No  Physical Activity: Not on file  Stress: Not on file  Social Connections: Socially Isolated (12/06/2023)   Social Connection and Isolation Panel  Frequency of Communication with Friends and Family: Once a week    Frequency of Social Gatherings with Friends and Family: Never    Attends Religious Services: Never    Database administrator or Organizations: No    Attends Engineer, structural: Never    Marital Status: Separated   Past Medical History:  Past Medical History:  Diagnosis Date   Asthma    Eczema    Mood swings    History reviewed. No pertinent surgical history.  Current Medications: Current Facility-Administered Medications  Medication Dose Route Frequency Provider Last Rate Last Admin    acetaminophen  (TYLENOL ) tablet 650 mg  650 mg Oral Q6H PRN White, Patrice L, NP       albuterol  (VENTOLIN  HFA) 108 (90 Base) MCG/ACT inhaler 1-2 puff  1-2 puff Inhalation Q6H PRN White, Patrice L, NP       alum & mag hydroxide-simeth (MAALOX/MYLANTA) 200-200-20 MG/5ML suspension 30 mL  30 mL Oral Q4H PRN White, Patrice L, NP       benztropine  (COGENTIN ) tablet 0.5 mg  0.5 mg Oral BID White, Patrice L, NP   0.5 mg at 12/08/23 0850   divalproex  (DEPAKOTE ) DR tablet 500 mg  500 mg Oral BID White, Patrice L, NP   500 mg at 12/08/23 0850   haloperidol  (HALDOL ) tablet 5 mg  5 mg Oral BID White, Patrice L, NP   5 mg at 12/08/23 9149   hydrOXYzine  (ATARAX ) tablet 25 mg  25 mg Oral TID PRN White, Patrice L, NP       magnesium  hydroxide (MILK OF MAGNESIA) suspension 30 mL  30 mL Oral Daily PRN White, Patrice L, NP       OLANZapine  (ZYPREXA ) injection 10 mg  10 mg Intramuscular TID PRN White, Patrice L, NP       OLANZapine  (ZYPREXA ) injection 5 mg  5 mg Intramuscular TID PRN White, Patrice L, NP       OLANZapine  zydis (ZYPREXA ) disintegrating tablet 5 mg  5 mg Oral TID PRN White, Patrice L, NP       traZODone  (DESYREL ) tablet 50 mg  50 mg Oral QHS PRN White, Patrice L, NP   50 mg at 12/07/23 2134    Lab Results: No results found for this or any previous visit (from the past 48 hours).  Blood Alcohol level:  Lab Results  Component Value Date   [facility] <15 12/06/2023   ETH <10 06/10/2023    Metabolic Disorder Labs: Lab Results  Component Value Date   HGBA1C 4.9 12/06/2023   MPG 93.93 12/06/2023   MPG 105.41 06/10/2023   Lab Results  Component Value Date   PROLACTIN 20.2 01/20/2023   PROLACTIN 3.2 (L) 09/08/2021   Lab Results  Component Value Date   CHOL 169 12/06/2023   TRIG 34 12/06/2023   HDL 78 12/06/2023   CHOLHDL 2.2 12/06/2023   VLDL 7 12/06/2023   LDLCALC 84 12/06/2023   LDLCALC 74 06/10/2023    Physical Findings: AIMS:  , ,  ,  ,    CIWA:    COWS:      Psychiatric Specialty  Exam:  Presentation  General Appearance:  Appropriate for Environment  Eye Contact: Fair  Speech: Slow  Speech Volume: Decreased    Mood and Affect  Mood: Dysphoric  Affect: Congruent   Thought Process  Thought Processes: Irrevelant; Disorganized  Descriptions of Associations:Circumstantial  Orientation:Full (Time, Place and Person)  Thought Content:Illogical  Hallucinations: Denies Ideas of Reference:None  Suicidal Thoughts: Denies Homicidal Thoughts: Denies  Sensorium  Memory: Fair Judgment: Intact  Insight: Present; Shallow   Executive Functions  Concentration: Fair  Attention Span: Fair  Recall: Poor  Fund of Knowledge: Fair  Language: Fair   Psychomotor Activity  Psychomotor Activity:No data recorded Musculoskeletal: Strength & Muscle Tone: within normal limits Gait & Station: normal Assets  Assets: Manufacturing systems engineer; Desire for Improvement; Social Support; Housing    Physical Exam: Physical Exam ROS Blood pressure 99/69, pulse 67, temperature 97.9 F (36.6 C), resp. rate 17, height 6' 1 (1.854 m), weight 88.7 kg, SpO2 100%. Body mass index is 25.81 kg/m.  Diagnosis: Principal Problem:   Schizoaffective disorder (HCC) Bipolar type  Clinical Decision Making: Patient is currently admitted for disorganized behavior.  Patient will be monitored closely for optimization of medication and safety monitoring.  Treatment Plan Summary:  Safety and Monitoring:             -- Voluntary admission to inpatient psychiatric unit for safety, stabilization and treatment             -- Daily contact with patient to assess and evaluate symptoms and progress in treatment             -- Patient's case to be discussed in multi-disciplinary team meeting             -- Observation Level: q15 minute checks             -- Vital signs:  q12 hours             -- Precautions: suicide, elopement, and assault   2. Psychiatric Diagnoses and  Treatment:                 Haldol  5 mg twice daily Depakote  500 mg twice daily Cogentin  -- The risks/benefits/side-effects/alternatives to this medication were discussed in detail with the patient and time was given for questions. The patient consents to medication trial.                -- Metabolic profile and EKG monitoring obtained while on an atypical antipsychotic (BMI: Lipid Panel: HbgA1c: QTc:)              -- Encouraged patient to participate in unit milieu and in scheduled group therapies                            3. Medical Issues Being Addressed:    4. Discharge Planning:   -- Social work and case management to assist with discharge planning and identification of hospital follow-up needs prior to discharge  -- Estimated LOS: 3-4 days  Bowie Doiron, MD 12/08/2023, 3:33 PM

## 2023-12-08 NOTE — BH IP Treatment Plan (Signed)
 Interdisciplinary Treatment and Diagnostic Plan Update  12/08/2023 Time of Session: 10:17 Larry Simmons MRN: [id number]  Principal Diagnosis: Schizoaffective disorder [facility])  Secondary Diagnoses: Principal Problem:   Schizoaffective disorder (HCC)   Current Medications:  Current Facility-Administered Medications  Medication Dose Route Frequency Provider Last Rate Last Admin   acetaminophen  (TYLENOL ) tablet 650 mg  650 mg Oral Q6H PRN White, Patrice L, NP       albuterol  (VENTOLIN  HFA) 108 (90 Base) MCG/ACT inhaler 1-2 puff  1-2 puff Inhalation Q6H PRN White, Patrice L, NP       alum & mag hydroxide-simeth (MAALOX/MYLANTA) 200-200-20 MG/5ML suspension 30 mL  30 mL Oral Q4H PRN White, Patrice L, NP       benztropine  (COGENTIN ) tablet 0.5 mg  0.5 mg Oral BID White, Patrice L, NP   0.5 mg at 12/08/23 0850   divalproex  (DEPAKOTE ) DR tablet 500 mg  500 mg Oral BID White, Patrice L, NP   500 mg at 12/08/23 0850   haloperidol  (HALDOL ) tablet 5 mg  5 mg Oral BID White, Patrice L, NP   5 mg at 12/08/23 0850   hydrOXYzine  (ATARAX ) tablet 25 mg  25 mg Oral TID PRN White, Patrice L, NP       magnesium  hydroxide (MILK OF MAGNESIA) suspension 30 mL  30 mL Oral Daily PRN White, Patrice L, NP       OLANZapine  (ZYPREXA ) injection 10 mg  10 mg Intramuscular TID PRN White, Patrice L, NP       OLANZapine  (ZYPREXA ) injection 5 mg  5 mg Intramuscular TID PRN White, Patrice L, NP       OLANZapine  zydis (ZYPREXA ) disintegrating tablet 5 mg  5 mg Oral TID PRN White, Patrice L, NP       traZODone  (DESYREL ) tablet 50 mg  50 mg Oral QHS PRN White, Patrice L, NP   50 mg at 12/07/23 2134   PTA Medications: Medications Prior to Admission  Medication Sig Dispense Refill Last Dose/Taking   albuterol  (VENTOLIN  HFA) 108 (90 Base) MCG/ACT inhaler Inhale 1-2 puffs into the lungs every 6 (six) hours as needed for wheezing or shortness of breath. (Patient not taking: Reported on 12/06/2023) 6.7 g 3    benztropine   (COGENTIN ) 0.5 MG tablet Take 1 tablet (0.5 mg total) by mouth 2 (two) times daily. 60 tablet 3    divalproex  (DEPAKOTE ) 500 MG DR tablet Take 1 tablet (500 mg total) by mouth 2 (two) times daily. 60 tablet 3    haloperidol  (HALDOL ) 5 MG tablet Take 1 tablet (5 mg total) by mouth 2 (two) times daily.      paliperidone  (INVEGA  SUSTENNA) 156 MG/ML SUSY injection Inject 1 mL (156 mg total) into the muscle every 28 (twenty-eight) days. 1 mL 11    traZODone  (DESYREL ) 50 MG tablet Take 1 tablet (50 mg total) by mouth at bedtime as needed for sleep. (Patient taking differently: Take 50 mg by mouth at bedtime.) 30 tablet 3     Patient Stressors: Loss of relationship    Patient Strengths: Forensic psychologist fund of knowledge   Treatment Modalities: Medication Management, Group therapy, Case management,  1 to 1 session with clinician, Psychoeducation, Recreational therapy.   Physician Treatment Plan for Primary Diagnosis: Schizoaffective disorder [facility]) Long Term Goal(s): Improvement in symptoms so as ready for discharge relationship help.   Short Term Goals: Ability to disclose and discuss suicidal ideas Ability to identify and develop effective coping behaviors will improve Ability to  identify triggers associated with substance abuse/mental health issues will improve  Medication Management: Evaluate patient's response, side effects, and tolerance of medication regimen.  Therapeutic Interventions: 1 to 1 sessions, Unit Group sessions and Medication administration.  Evaluation of Outcomes: Not Met  Physician Treatment Plan for Secondary Diagnosis: Principal Problem:   Schizoaffective disorder (HCC)  Long Term Goal(s): Improvement in symptoms so as ready for discharge relationship help.   Short Term Goals: Ability to disclose and discuss suicidal ideas Ability to identify and develop effective coping behaviors will improve Ability to identify triggers associated with substance  abuse/mental health issues will improve     Medication Management: Evaluate patient's response, side effects, and tolerance of medication regimen.  Therapeutic Interventions: 1 to 1 sessions, Unit Group sessions and Medication administration.  Evaluation of Outcomes: Not Met   RN Treatment Plan for Primary Diagnosis: Schizoaffective disorder (HCC) Long Term Goal(s): Knowledge of disease and therapeutic regimen to maintain health will improve  Short Term Goals: Ability to remain free from injury will improve, Ability to verbalize frustration and anger appropriately will improve, Ability to demonstrate self-control, Ability to participate in decision making will improve, Ability to verbalize feelings will improve, Ability to disclose and discuss suicidal ideas, Ability to identify and develop effective coping behaviors will improve, and Compliance with prescribed medications will improve  Medication Management: RN will administer medications as ordered by provider, will assess and evaluate patient's response and provide education to patient for prescribed medication. RN will report any adverse and/or side effects to prescribing provider.  Therapeutic Interventions: 1 on 1 counseling sessions, Psychoeducation, Medication administration, Evaluate responses to treatment, Monitor vital signs and CBGs as ordered, Perform/monitor CIWA, COWS, AIMS and Fall Risk screenings as ordered, Perform wound care treatments as ordered.  Evaluation of Outcomes: Not Met   LCSW Treatment Plan for Primary Diagnosis: Schizoaffective disorder (HCC) Long Term Goal(s): Safe transition to appropriate next level of care at discharge, Engage patient in therapeutic group addressing interpersonal concerns.  Short Term Goals: Engage patient in aftercare planning with referrals and resources, Increase social support, Increase ability to appropriately verbalize feelings, Increase emotional regulation, Facilitate acceptance of  mental health diagnosis and concerns, and Increase skills for wellness and recovery  Therapeutic Interventions: Assess for all discharge needs, 1 to 1 time with Social worker, Explore available resources and support systems, Assess for adequacy in community support network, Educate family and significant other(s) on suicide prevention, Complete Psychosocial Assessment, Interpersonal group therapy.  Evaluation of Outcomes: Not Met   Progress in Treatment: Attending groups: Yes. and No. Participating in groups: Yes. Taking medication as prescribed: Yes. Toleration medication: Yes. Family/Significant other contact made: No, will contact:  when given permission.  Patient understands diagnosis: Yes. Discussing patient identified problems/goals with staff: Yes. Medical problems stabilized or resolved: Yes. Denies suicidal/homicidal ideation: Yes. Issues/concerns per patient self-inventory: No. Other: none.  New problem(s) identified: No, Describe:  none identified.  New Short Term/Long Term Goal(s): elimination of symptoms of psychosis, medication management for mood stabilization; elimination of SI thoughts; development of comprehensive mental wellness plan.  Patient Goals:  I just want a partner and I just want to be happy again.   Discharge Plan or Barriers: CSW will assist pt with development of an appropriate aftercare/discharge plan.   Reason for Continuation of Hospitalization: Hallucinations Homicidal ideation Medication stabilization Suicidal ideation  Estimated Length of Stay: 1-7 days  Last 3 [place] Suicide Severity Risk Score: Flowsheet Row Admission (Current) from 12/06/2023 in [facility]  Most recent reading at 12/06/2023 11:00 PM ED from 12/06/2023 in [facility] Most recent reading at 12/06/2023  2:00 PM Clinical Support from 11/18/2023 in [facility] Most recent reading at 11/18/2023 10:53  AM  C-SSRS RISK CATEGORY Moderate Risk Moderate Risk No Risk    Last PHQ 2/9 Scores:    11/18/2023   10:53 AM 09/16/2023   11:02 AM 07/22/2023    9:03 AM  Depression screen PHQ 2/9  Decreased Interest 0 0   Down, Depressed, Hopeless 0 0 0  PHQ - 2 Score 0 0 0  Altered sleeping   0  Tired, decreased energy   1  Change in appetite   1  Feeling bad or failure about yourself    0  Trouble concentrating   0  Moving slowly or fidgety/restless   1  Suicidal thoughts   0  PHQ-9 Score   3  Difficult doing work/chores   Not difficult at all    Scribe for Treatment Team: Nadara JONELLE Fam, LCSW 12/08/2023 10:53 AM

## 2023-12-08 NOTE — Progress Notes (Addendum)
 Pt presented with a sad and flat affect, mood is good and he is pleasant. Pt appears to be responding to internal stimuli but, denied AVH. Pt is compliant with taking his medications and attended patient's activities.      12/08/23 1400  Psych Admission Type (Psych Patients Only)  Admission Status Voluntary  Psychosocial Assessment  Patient Complaints Anxiety  Eye Contact Fair  Facial Expression Flat;Sad  Affect Flat  Speech Soft  Interaction Guarded  Motor Activity Slow  Appearance/Hygiene Unremarkable  Behavior Characteristics Cooperative  Mood Anxious  Thought Process  Coherency Circumstantial  Content Preoccupation  Delusions None reported or observed  Perception Hallucinations  Hallucination Auditory  Judgment Impaired  Confusion None  Danger to Self  Current suicidal ideation? Denies  Agreement Not to Harm Self Yes  Description of Agreement  (verbal)  Danger to Others  Danger to Others None reported or observed

## 2023-12-08 NOTE — Group Note (Signed)
 Date:  12/08/2023 Time:  7:13 PM  Group Topic/Focus:  Wellness Toolbox:   The focus of this group is to discuss various aspects of wellness, balancing those aspects and exploring ways to increase the ability to experience wellness.  Patients will create a wellness toolbox for use upon discharge.    Participation Level:  Active  Participation Quality:  Appropriate  Affect:  Appropriate  Cognitive:  Appropriate  Insight: Appropriate  Engagement in Group:  Engaged  Modes of Intervention:  Activity and Socialization  Additional Comments:    Deitra Caron Mainland 12/08/2023, 7:13 PM

## 2023-12-08 NOTE — Group Note (Signed)
 [facility] LCSW Group Therapy Note   Group Date: 12/08/2023 Start Time: 1300 End Time: 1415   Type of Therapy/Topic:  Group Therapy:  Emotion Regulation  Participation Level:  Active    Description of Group:    The purpose of this group is to assist patients in learning to regulate negative emotions and experience positive emotions. Patients will be guided to discuss ways in which they have been vulnerable to their negative emotions. These vulnerabilities will be juxtaposed with experiences of positive emotions or situations, and patients challenged to use positive emotions to combat negative ones. Special emphasis will be placed on coping with negative emotions in conflict situations, and patients will process healthy conflict resolution skills.  Therapeutic Goals: Patient will identify two positive emotions or experiences to reflect on in order to balance out negative emotions:  Patient will label two or more emotions that they find the most difficult to experience:  Patient will be able to demonstrate positive conflict resolution skills through discussion or role plays:   Summary of Patient Progress: Patient was present for the second half of group. He was actively engaged in the conversation. Pt appeared open and receptive to feedback/comments from both his peers and the facilitator.    Therapeutic Modalities:   Cognitive Behavioral Therapy Feelings Identification Dialectical Behavioral Therapy   Nadara JONELLE Fam, LCSW

## 2023-12-08 NOTE — [facility] Suicide Risk Assessment (Signed)
 [facility] INPATIENT:  Family/Significant Other Suicide Prevention Education  Suicide Prevention Education:  Education Completed; Zebedee Hillock/mom [phone]), has been identified by the patient as the family member/significant other with whom the patient will be residing, and identified as the person(s) who will aid the patient in the event of a mental health crisis (suicidal ideations/suicide attempt).  With written consent from the patient, the family member/significant other has been provided the following suicide prevention education, prior to the and/or following the discharge of the patient.  The suicide prevention education provided includes the following: Suicide risk factors Suicide prevention and interventions National Suicide Hotline telephone number [facility] assessment telephone number [facility] Emergency Assistance 911 [facility] and/or Residential Mobile Crisis Unit telephone number  Request made of family/significant other to: Remove weapons (e.g., guns, rifles, knives), all items previously/currently identified as safety concern.   Remove drugs/medications (over-the-counter, prescriptions, illicit drugs), all items previously/currently identified as a safety concern.  The family member/significant other verbalizes understanding of the suicide prevention education information provided.  The family member/significant other agrees to remove the items of safety concern listed above.  Mother stated that pt came to the hospital because he had not been taking his medication. She shared that pt told her that he had not been taking it for three months. His mother denied feeling that he is a danger to himself or anyone else. She denied him having any access to weapons.   Nadara JONELLE Fam 12/08/2023, 4:16 PM

## 2023-12-08 NOTE — Group Note (Signed)
 Date:  12/08/2023 Time:  3:10 PM  Group Topic/Focus:  Building Self Esteem:   The Focus of this group is helping patients become aware of the effects of self-esteem on their lives, the things they and others do that enhance or undermine their self-esteem, seeing the relationship between their level of self-esteem and the choices they make and learning ways to enhance self-esteem.    Participation Level:  Did Not Attend   Larry Simmons [name] 12/08/2023, 3:10 PM

## 2023-12-08 NOTE — Plan of Care (Signed)
   Problem: Education: Goal: Emotional status will improve Outcome: Progressing Goal: Mental status will improve Outcome: Progressing

## 2023-12-08 NOTE — Group Note (Signed)
 Date:  12/08/2023 Time:  9:14 PM  Group Topic/Focus:  Wrap-Up Group:   The focus of this group is to help patients review their daily goal of treatment and discuss progress on daily workbooks.    Participation Level:  Minimal  Participation Quality:  Appropriate  Affect:  Appropriate  Cognitive:  Appropriate  Insight: Appropriate  Engagement in Group:  Limited  Modes of Intervention:  Discussion  Additional Comments:     Kerri Katz 12/08/2023, 9:14 PM

## 2023-12-08 NOTE — [facility] Counselor (Signed)
 CSW reached out to [facility] [phone]) to see if pt can return there upon discharge. Contact was not able to be established but HIPAA compliant voicemail left with contact information for follow through.   Nadara SAUNDERS. Chaim, MSW, LCSW, LCAS 12/08/2023 4:19 PM

## 2023-12-09 DIAGNOSIS — F250 Schizoaffective disorder, bipolar type: Secondary | ICD-10-CM | POA: Diagnosis not present

## 2023-12-09 NOTE — Progress Notes (Signed)
   12/09/23 0229  Psych Admission Type (Psych Patients Only)  Admission Status Voluntary  Psychosocial Assessment  Patient Complaints Anxiety  Eye Contact Fair  Facial Expression Flat  Affect Flat  Speech Soft  Interaction Guarded  Motor Activity Slow  Appearance/Hygiene Unremarkable  Behavior Characteristics Cooperative  Mood Anxious  Thought Process  Coherency Circumstantial  Content Preoccupation  Delusions None reported or observed  Perception Hallucinations  Hallucination Auditory  Judgment Impaired  Confusion None  Danger to Self  Current suicidal ideation? Denies  Description of Suicide Plan Denied  Agreement Not to Harm Self Yes  Description of Agreement Verbal  Danger to Others  Danger to Others None reported or observed

## 2023-12-09 NOTE — Group Note (Signed)
 Recreation Therapy Group Note   Group Topic:Animal Assisted Therapy   Group Date: 12/09/2023 Start Time: 1000 End Time: 1035 Facilitators: Celestia Jeoffrey BRAVO, LRT, CTRS Location: Courtyard  Group Description: AAA. Animal-Assisted Activity provides opportunities for motivational, educational, therapeutic and/or recreational benefits to enhance quality of life. Selinda Geophysicist/field seismologist) and Rollo (dog) visited the unit to interact with patients.   Goal Areas Addressed:  Reduced anxiety and stress Improved mood Increased social interaction Enhanced communication skills Reduced loneliness and isolation Improved emotional regulation   Affect/Mood: N/A   Participation Level: Did not attend    Clinical Observations/Individualized Feedback: Patient did not attend group.   Plan: Continue to engage patient in RT group sessions 2-3x/week.   Jeoffrey BRAVO Celestia, LRT, CTRS 12/09/2023 1:27 PM

## 2023-12-09 NOTE — Plan of Care (Signed)

## 2023-12-09 NOTE — Group Note (Signed)
 [facility] LCSW Group Therapy Note   Group Date: 12/09/2023 Start Time: 1305 End Time: 1415   Type of Therapy/Topic:  Group Therapy:  Emotion Regulation  Participation Level:  Active   Mood:Appropriate   Description of Group:    The purpose of this group is to assist patients in learning to regulate negative emotions and experience positive emotions. Patients will be guided to discuss ways in which they have been vulnerable to their negative emotions. These vulnerabilities will be juxtaposed with experiences of positive emotions or situations, and patients challenged to use positive emotions to combat negative ones. Special emphasis will be placed on coping with negative emotions in conflict situations, and patients will process healthy conflict resolution skills.  Therapeutic Goals: Patient will identify two positive emotions or experiences to reflect on in order to balance out negative emotions:  Patient will label two or more emotions that they find the most difficult to experience:  Patient will be able to demonstrate positive conflict resolution skills through discussion or role plays:   Summary of Patient Progress:   During group, patient and group explored the ways in which our thoughts can impact our feelings which impacts our behaviors. Group along with facilitator completed a thermometer activity where different areas of life were explored. Participants were asked to notate in which zone these areas exist in on their personal thermometers. The group then discussed coping skills, and safety plans to help better prepare for potential stressors and learn to better emotionally regulate.     Therapeutic Modalities:   Cognitive Behavioral Therapy Feelings Identification Dialectical Behavioral Therapy   Alveta CHRISTELLA Kerns, LCSW

## 2023-12-09 NOTE — Group Note (Signed)
 Date:  12/09/2023 Time:  10:15 PM  Group Topic/Focus:  Overcoming Stress:   The focus of this group is to define stress and help patients assess their triggers.    Participation Level:  Did Not Attend  Participation Quality:  none  Affect:  none  Cognitive:  none  Insight: None  Engagement in Group:  none  Modes of Intervention:  none  Additional Comments:  none   Kerri Katz 12/09/2023, 10:15 PM

## 2023-12-09 NOTE — Group Note (Signed)
 Date:  12/09/2023 Time:  4:15 PM  Group Topic/Focus:  Goals Group:   The focus of this group is to help patients establish daily goals to achieve during treatment and discuss how the patient can incorporate goal setting into their daily lives to aide in recovery.    Participation Level:  Active  Participation Quality:  Appropriate  Affect:  Appropriate  Cognitive:  Alert  Insight: Appropriate  Engagement in Group:  Engaged  Modes of Intervention:  Activity, Discussion, and Education  Additional Comments:    Larry Simmons 12/09/2023, 4:15 PM

## 2023-12-09 NOTE — Progress Notes (Addendum)
 Pleasant and cooperative, compliant with taking his medication, denied having AVH, but appears to be responding.    12/09/23 1300  Psych Admission Type (Psych Patients Only)  Admission Status Voluntary  Psychosocial Assessment  Patient Complaints Anxiety  Eye Contact Fair  Facial Expression Flat  Affect Flat  Speech Soft  Interaction Guarded  Motor Activity Slow  Appearance/Hygiene Unremarkable  Behavior Characteristics Cooperative  Mood Pleasant  Thought Process  Coherency Circumstantial  Content Preoccupation  Delusions None reported or observed  Perception Hallucinations  Hallucination Auditory  Judgment Impaired  Confusion None  Danger to Self  Current suicidal ideation? Denies  Agreement Not to Harm Self Yes  Description of Agreement  (verbal)  Danger to Others  Danger to Others None reported or observed

## 2023-12-09 NOTE — Group Note (Deleted)
 [facility] LCSW Group Therapy Note   Group Date: 12/09/2023 Start Time: 1305 End Time: 1422   Type of Therapy and Topic: Group Therapy: Avoiding Self-Sabotaging and Enabling Behaviors  Participation Level: {[facility] PARTICIPATION OZCZO:77735}  Mood:  Description of Group:  In this group, patients will learn how to identify obstacles, self-sabotaging and enabling behaviors, as well as: what are they, why do we do them and what needs these behaviors meet. Discuss unhealthy relationships and how to have positive healthy boundaries with those that sabotage and enable. Explore aspects of self-sabotage and enabling in yourself and how to limit these self-destructive behaviors in everyday life.   Therapeutic Goals: 1. Patient will identify one obstacle that relates to self-sabotage and enabling behaviors 2. Patient will identify one personal self-sabotaging or enabling behavior they did prior to admission 3. Patient will state a plan to change the above identified behavior 4. Patient will demonstrate ability to communicate their needs through discussion and/or role play.    Summary of Patient Progress:   ***   Therapeutic Modalities:  Cognitive Behavioral Therapy Person-Centered Therapy Motivational Interviewing    Larry Marzano M Ala Kratz, LCSW

## 2023-12-09 NOTE — Progress Notes (Signed)
 [facility] MD Progress Note  12/09/2023 5:58 PM ILAN KAHRS  MRN:  [id number]  Larry Simmons is a 33 y.o. male patient with a documented history significant for schizoaffective disorder, bipolar type and a medical history of asthma and eczema who presented to the [facility] accompanied by a staff member from the [facility] with complaints of having problems with his ex-girlfriend and thoughts to harm himself and his ex-girlfriend.Patient is admitted to adult psych unit with Q15 min safety monitoring. Multidisciplinary team approach is offered. Medication management; group/milieu therapy is offered.   Subjective:  Chart reviewed, case discussed in multidisciplinary meeting, patient seen during rounds.  On interview patient is noted to be participating in groups.  He came out to talk to the provider.  He offers no complaints.  He denies SI/HI/plan and denies hallucinations.  He reports fair appetite and sleep.  He is tolerating the medications with no reported side effects.  Patient understands that he is being monitored on the inpatient unit for safety concerns and regarding the threats he made regarding his ex-girlfriend.    Sleep: Fair  Appetite:  Fair  Past Psychiatric History: see h&P Family History: History reviewed. No pertinent family history. Social History:  Social History   Substance and Sexual Activity  Alcohol Use Not Currently   Comment: occassionl     Social History   Substance and Sexual Activity  Drug Use Not Currently   Types: Marijuana   Comment: Last used 04/2022    Social History   Socioeconomic History   Marital status: Single    Spouse name: Not on file   Number of children: 0   Years of education: Not on file   Highest education level: High school graduate  Occupational History   Occupation: Social research officer, government     Comment: Museum/gallery curator  Tobacco Use   Smoking status: Every Day    Current packs/day: 0.50     Average packs/day: 0.5 packs/day for 13.8 years (6.9 ttl pk-yrs)    Types: Cigarettes    Start date: 03/02/2010   Smokeless tobacco: Never   Tobacco comments:    Reports down to 2 a day and trying to quit   Vaping Use   Vaping status: Never Used  Substance and Sexual Activity   Alcohol use: Not Currently    Comment: occassionl   Drug use: Not Currently    Types: Marijuana    Comment: Last used 04/2022   Sexual activity: Not Currently    Comment: Last sexual encounter 04/2022  Other Topics Concern   Not on file  Social History Narrative   Not on file   Social Drivers of Health   Financial Resource Strain: Not on file  Food Insecurity: No Food Insecurity (12/06/2023)   Hunger Vital Sign    Worried About Running Out of Food in the Last Year: Never true    Ran Out of Food in the Last Year: Never true  Transportation Needs: No Transportation Needs (12/06/2023)   PRAPARE - Administrator, Civil Service (Medical): No    Lack of Transportation (Non-Medical): No  Physical Activity: Not on file  Stress: Not on file  Social Connections: Socially Isolated (12/06/2023)   Social Connection and Isolation Panel    Frequency of Communication with Friends and Family: Once a week    Frequency of Social Gatherings with Friends and Family: Never    Attends Religious Services: Never    Production manager of  Clubs or Organizations: No    Attends Banker Meetings: Never    Marital Status: Separated   Past Medical History:  Past Medical History:  Diagnosis Date   Asthma    Eczema    Mood swings    History reviewed. No pertinent surgical history.  Current Medications: Current Facility-Administered Medications  Medication Dose Route Frequency Provider Last Rate Last Admin   acetaminophen  (TYLENOL ) tablet 650 mg  650 mg Oral Q6H PRN White, Patrice L, NP       albuterol  (VENTOLIN  HFA) 108 (90 Base) MCG/ACT inhaler 1-2 puff  1-2 puff Inhalation Q6H PRN White, Patrice L, NP        alum & mag hydroxide-simeth (MAALOX/MYLANTA) 200-200-20 MG/5ML suspension 30 mL  30 mL Oral Q4H PRN White, Patrice L, NP       benztropine  (COGENTIN ) tablet 0.5 mg  0.5 mg Oral BID White, Patrice L, NP   0.5 mg at 12/09/23 1756   divalproex  (DEPAKOTE ) DR tablet 500 mg  500 mg Oral BID White, Patrice L, NP   500 mg at 12/09/23 1756   haloperidol  (HALDOL ) tablet 5 mg  5 mg Oral BID White, Patrice L, NP   5 mg at 12/09/23 1756   hydrOXYzine  (ATARAX ) tablet 25 mg  25 mg Oral TID PRN White, Patrice L, NP       magnesium  hydroxide (MILK OF MAGNESIA) suspension 30 mL  30 mL Oral Daily PRN White, Patrice L, NP       OLANZapine  (ZYPREXA ) injection 10 mg  10 mg Intramuscular TID PRN White, Patrice L, NP       OLANZapine  (ZYPREXA ) injection 5 mg  5 mg Intramuscular TID PRN White, Patrice L, NP       OLANZapine  zydis (ZYPREXA ) disintegrating tablet 5 mg  5 mg Oral TID PRN White, Patrice L, NP       traZODone  (DESYREL ) tablet 50 mg  50 mg Oral QHS PRN White, Patrice L, NP   50 mg at 12/08/23 2127    Lab Results: No results found for this or any previous visit (from the past 48 hours).  Blood Alcohol level:  Lab Results  Component Value Date   [facility] <15 12/06/2023   ETH <10 06/10/2023    Metabolic Disorder Labs: Lab Results  Component Value Date   HGBA1C 4.9 12/06/2023   MPG 93.93 12/06/2023   MPG 105.41 06/10/2023   Lab Results  Component Value Date   PROLACTIN 20.2 01/20/2023   PROLACTIN 3.2 (L) 09/08/2021   Lab Results  Component Value Date   CHOL 169 12/06/2023   TRIG 34 12/06/2023   HDL 78 12/06/2023   CHOLHDL 2.2 12/06/2023   VLDL 7 12/06/2023   LDLCALC 84 12/06/2023   LDLCALC 74 06/10/2023    Physical Findings: AIMS:  , ,  ,  ,    CIWA:    COWS:      Psychiatric Specialty Exam:  Presentation  General Appearance:  Appropriate for Environment  Eye Contact: Fair  Speech: Slow  Speech Volume: Decreased    Mood and Affect   Mood: Dysphoric  Affect: Congruent   Thought Process  Thought Processes: Irrevelant; Disorganized  Descriptions of Associations:Circumstantial  Orientation:Full (Time, Place and Person)  Thought Content:Illogical  Hallucinations: Denies Ideas of Reference:None  Suicidal Thoughts: Denies Homicidal Thoughts: Denies  Sensorium  Memory: Fair Judgment: Intact  Insight: Present; Shallow   Executive Functions  Concentration: Fair  Attention Span: Fair  Recall: Poor  Fund of  Knowledge: Fair  Language: Fair   Psychomotor Activity  Psychomotor Activity:No data recorded Musculoskeletal: Strength & Muscle Tone: within normal limits Gait & Station: normal Assets  Assets: Manufacturing systems engineer; Desire for Improvement; Social Support; Housing    Physical Exam: Physical Exam Vitals and nursing note reviewed.    ROS Blood pressure (!) 96/49, pulse 71, temperature 97.7 F (36.5 C), resp. rate 16, height 6' 1 (1.854 m), weight 88.7 kg, SpO2 98%. Body mass index is 25.81 kg/m.  Diagnosis: Principal Problem:   Schizoaffective disorder (HCC) Bipolar type  Clinical Decision Making: Patient is currently admitted for disorganized behavior.  Patient will be monitored closely for optimization of medication and safety monitoring.  Treatment Plan Summary:  Safety and Monitoring:             -- Voluntary admission to inpatient psychiatric unit for safety, stabilization and treatment             -- Daily contact with patient to assess and evaluate symptoms and progress in treatment             -- Patient's case to be discussed in multi-disciplinary team meeting             -- Observation Level: q15 minute checks             -- Vital signs:  q12 hours             -- Precautions: suicide, elopement, and assault   2. Psychiatric Diagnoses and Treatment:                 Haldol  5 mg twice daily Depakote  500 mg twice daily-will check Depakote   levels Cogentin  -- The risks/benefits/side-effects/alternatives to this medication were discussed in detail with the patient and time was given for questions. The patient consents to medication trial.                -- Metabolic profile and EKG monitoring obtained while on an atypical antipsychotic (BMI: Lipid Panel: HbgA1c: QTc:)              -- Encouraged patient to participate in unit milieu and in scheduled group therapies                            3. Medical Issues Being Addressed:    4. Discharge Planning:   -- Social work and case management to assist with discharge planning and identification of hospital follow-up needs prior to discharge  -- Estimated LOS: 3-4 days  Kieley Akter, MD 12/09/2023, 5:58 PM

## 2023-12-10 DIAGNOSIS — F250 Schizoaffective disorder, bipolar type: Secondary | ICD-10-CM | POA: Diagnosis not present

## 2023-12-10 LAB — VALPROIC ACID LEVEL: Valproic Acid Lvl: 88 ug/mL (ref 50–100)

## 2023-12-10 NOTE — Progress Notes (Addendum)
 Pleasant and cooperative, compliant with taking his medication, denied having AVH and is focusing on going home.        12/10/23 1000  Psych Admission Type (Psych Patients Only)  Admission Status Voluntary  Psychosocial Assessment  Patient Complaints Sadness  Eye Contact Fair  Facial Expression Flat  Affect Flat  Speech Soft  Interaction Guarded  Motor Activity Slow  Appearance/Hygiene Unremarkable  Behavior Characteristics Cooperative  Mood Pleasant  Thought Process  Coherency Circumstantial  Content Preoccupation  Delusions None reported or observed  Perception Hallucinations  Hallucination Auditory  Judgment Impaired  Confusion None  Danger to Self  Current suicidal ideation? Denies  Agreement Not to Harm Self Yes  Description of Agreement  (verbal)  Danger to Others  Danger to Others None reported or observed

## 2023-12-10 NOTE — Group Note (Signed)
 Recreation Therapy Group Note   Group Topic:Leisure Education  Group Date: 12/10/2023 Start Time: 1300 End Time: 1400 Facilitators: Celestia Jeoffrey BRAVO, LRT, CTRS Location: Craft Room  Group Description: Leisure. Patients were given the option to choose from journaling, coloring, drawing, making origami, playing with playdoh, listening to music or singing karaoke. LRT and pts discussed the meaning of leisure, the importance of participating in leisure during their free time/when they're outside of the hospital, as well as how our leisure interests can also serve as coping skills.   Goal Area(s) Addressed:  Patient will identify a current leisure interest.  Patient will learn the definition of "leisure". Patient will practice making a positive decision. Patient will have the opportunity to try a new leisure activity. Patient will communicate with peers and LRT.    Affect/Mood: Appropriate   Participation Level: Active and Engaged   Participation Quality: Independent   Behavior: Appropriate, Calm, and Cooperative   Speech/Thought Process: Coherent   Insight: Good   Judgement: Good   Modes of Intervention: Clarification, Education, Exploration, and Music   Patient Response to Interventions:  Attentive, Engaged, Interested , and Receptive   Education Outcome:  Acknowledges education   Clinical Observations/Individualized Feedback: Starlin was active in their participation of session activities and group discussion. Pt identified fish and spend time with my niece and nephew as things he does in his free time. Pt chose to play with play doh while in group.    Plan: Continue to engage patient in RT group sessions 2-3x/week.   Jeoffrey BRAVO Celestia, LRT, CTRS 12/10/2023 2:18 PM

## 2023-12-10 NOTE — Progress Notes (Signed)
 [facility] MD Progress Note  12/10/2023 10:57 PM WALID HAIG  MRN:  [id number]  Larry Simmons is a 33 y.o. male patient with a documented history significant for schizoaffective disorder, bipolar type and a medical history of asthma and eczema who presented to the [facility] accompanied by a staff member from the [facility] with complaints of having problems with his ex-girlfriend and thoughts to harm himself and his ex-girlfriend.Patient is admitted to adult psych unit with Q15 min safety monitoring. Multidisciplinary team approach is offered. Medication management; group/milieu therapy is offered.   Subjective:  Chart reviewed, case discussed in multidisciplinary meeting, patient seen during rounds.    12/10/23: Patient is noted to be sleeping in his room.  He offers no complaints.  He reports that he is feeling a lot better being in the hospital.  He reports that he is tolerating the medications with no reported side effects.  He denies SI/HI/plan and denies auditory/visual hallucinations.  Per nursing patient is participating in groups.  12/09/23:On interview patient is noted to be participating in groups.  He came out to talk to the provider.  He offers no complaints.  He denies SI/HI/plan and denies hallucinations.  He reports fair appetite and sleep.  He is tolerating the medications with no reported side effects.  Patient understands that he is being monitored on the inpatient unit for safety concerns and regarding the threats he made regarding his ex-girlfriend.    Sleep: Fair  Appetite:  Fair  Past Psychiatric History: see h&P Family History: History reviewed. No pertinent family history. Social History:  Social History   Substance and Sexual Activity  Alcohol Use Not Currently   Comment: occassionl     Social History   Substance and Sexual Activity  Drug Use Not Currently   Types: Marijuana   Comment: Last used 04/2022    Social  History   Socioeconomic History   Marital status: Single    Spouse name: Not on file   Number of children: 0   Years of education: Not on file   Highest education level: High school graduate  Occupational History   Occupation: Social research officer, government     Comment: Museum/gallery curator  Tobacco Use   Smoking status: Every Day    Current packs/day: 0.50    Average packs/day: 0.5 packs/day for 13.8 years (6.9 ttl pk-yrs)    Types: Cigarettes    Start date: 03/02/2010   Smokeless tobacco: Never   Tobacco comments:    Reports down to 2 a day and trying to quit   Vaping Use   Vaping status: Never Used  Substance and Sexual Activity   Alcohol use: Not Currently    Comment: occassionl   Drug use: Not Currently    Types: Marijuana    Comment: Last used 04/2022   Sexual activity: Not Currently    Comment: Last sexual encounter 04/2022  Other Topics Concern   Not on file  Social History Narrative   Not on file   Social Drivers of Health   Financial Resource Strain: Not on file  Food Insecurity: No Food Insecurity (12/06/2023)   Hunger Vital Sign    Worried About Running Out of Food in the Last Year: Never true    Ran Out of Food in the Last Year: Never true  Transportation Needs: No Transportation Needs (12/06/2023)   PRAPARE - Administrator, Civil Service (Medical): No    Lack of Transportation (Non-Medical): No  Physical Activity: Not on file  Stress: Not on file  Social Connections: Socially Isolated (12/06/2023)   Social Connection and Isolation Panel    Frequency of Communication with Friends and Family: Once a week    Frequency of Social Gatherings with Friends and Family: Never    Attends Religious Services: Never    Database administrator or Organizations: No    Attends Engineer, structural: Never    Marital Status: Separated   Past Medical History:  Past Medical History:  Diagnosis Date   Asthma    Eczema    Mood swings    History reviewed. No pertinent  surgical history.  Current Medications: Current Facility-Administered Medications  Medication Dose Route Frequency Provider Last Rate Last Admin   acetaminophen  (TYLENOL ) tablet 650 mg  650 mg Oral Q6H PRN White, Patrice L, NP       albuterol  (VENTOLIN  HFA) 108 (90 Base) MCG/ACT inhaler 1-2 puff  1-2 puff Inhalation Q6H PRN White, Patrice L, NP       alum & mag hydroxide-simeth (MAALOX/MYLANTA) 200-200-20 MG/5ML suspension 30 mL  30 mL Oral Q4H PRN White, Patrice L, NP       benztropine  (COGENTIN ) tablet 0.5 mg  0.5 mg Oral BID White, Patrice L, NP   0.5 mg at 12/10/23 1741   divalproex  (DEPAKOTE ) DR tablet 500 mg  500 mg Oral BID White, Patrice L, NP   500 mg at 12/10/23 1742   haloperidol  (HALDOL ) tablet 5 mg  5 mg Oral BID White, Patrice L, NP   5 mg at 12/10/23 1742   hydrOXYzine  (ATARAX ) tablet 25 mg  25 mg Oral TID PRN Teresa Jes L, NP   25 mg at 12/10/23 2129   magnesium  hydroxide (MILK OF MAGNESIA) suspension 30 mL  30 mL Oral Daily PRN White, Patrice L, NP       OLANZapine  (ZYPREXA ) injection 10 mg  10 mg Intramuscular TID PRN White, Patrice L, NP       OLANZapine  (ZYPREXA ) injection 5 mg  5 mg Intramuscular TID PRN White, Patrice L, NP       OLANZapine  zydis (ZYPREXA ) disintegrating tablet 5 mg  5 mg Oral TID PRN White, Patrice L, NP       traZODone  (DESYREL ) tablet 50 mg  50 mg Oral QHS PRN White, Patrice L, NP   50 mg at 12/10/23 2129    Lab Results:  Results for orders placed or performed during the hospital encounter of 12/06/23 (from the past 48 hours)  Valproic acid  level     Status: None   Collection Time: 12/10/23 10:03 AM  Result Value Ref Range   Valproic Acid  Lvl 88 50 - 100 ug/mL    Comment: Performed at [facility], [address] [place], [place] [address]    Blood Alcohol level:  Lab Results  Component Value Date   [facility] <15 12/06/2023   ETH <10 06/10/2023    Metabolic Disorder Labs: Lab Results  Component Value Date   HGBA1C 4.9 12/06/2023    MPG 93.93 12/06/2023   MPG 105.41 06/10/2023   Lab Results  Component Value Date   PROLACTIN 20.2 01/20/2023   PROLACTIN 3.2 (L) 09/08/2021   Lab Results  Component Value Date   CHOL 169 12/06/2023   TRIG 34 12/06/2023   HDL 78 12/06/2023   CHOLHDL 2.2 12/06/2023   VLDL 7 12/06/2023   LDLCALC 84 12/06/2023   LDLCALC 74 06/10/2023    Physical Findings: AIMS:  , ,  ,  ,  CIWA:    COWS:      Psychiatric Specialty Exam:  Presentation  General Appearance:  Appropriate for Environment  Eye Contact: Fair  Speech: Slow  Speech Volume: Decreased    Mood and Affect  Mood: Dysphoric  Affect: Congruent   Thought Process  Thought Processes: Irrevelant; Disorganized  Descriptions of Associations:Circumstantial  Orientation:Full (Time, Place and Person)  Thought Content:Illogical  Hallucinations: Denies Ideas of Reference:None  Suicidal Thoughts: Denies Homicidal Thoughts: Denies  Sensorium  Memory: Fair Judgment: Intact  Insight: Present; Shallow   Executive Functions  Concentration: Fair  Attention Span: Fair  Recall: Poor  Fund of Knowledge: Fair  Language: Fair   Psychomotor Activity  Psychomotor Activity:No data recorded Musculoskeletal: Strength & Muscle Tone: within normal limits Gait & Station: normal Assets  Assets: Manufacturing systems engineer; Desire for Improvement; Social Support; Housing    Physical Exam: Physical Exam Vitals and nursing note reviewed.    ROS Blood pressure 109/69, pulse 77, temperature 98.6 F (37 C), temperature source Oral, resp. rate 18, height 6' 1 (1.854 m), weight 88.7 kg, SpO2 100%. Body mass index is 25.81 kg/m.  Diagnosis: Principal Problem:   Schizoaffective disorder (HCC) Bipolar type  Clinical Decision Making: Patient is currently admitted for disorganized behavior.  Patient will be monitored closely for optimization of medication and safety monitoring.  Treatment Plan  Summary:  Safety and Monitoring:             -- Voluntary admission to inpatient psychiatric unit for safety, stabilization and treatment             -- Daily contact with patient to assess and evaluate symptoms and progress in treatment             -- Patient's case to be discussed in multi-disciplinary team meeting             -- Observation Level: q15 minute checks             -- Vital signs:  q12 hours             -- Precautions: suicide, elopement, and assault   2. Psychiatric Diagnoses and Treatment:                 Haldol  5 mg twice daily Depakote  500 mg twice daily- 12/08/23 Depakote  levels- 88 Cogentin  -- The risks/benefits/side-effects/alternatives to this medication were discussed in detail with the patient and time was given for questions. The patient consents to medication trial.                -- Metabolic profile and EKG monitoring obtained while on an atypical antipsychotic (BMI: Lipid Panel: HbgA1c: QTc:)              -- Encouraged patient to participate in unit milieu and in scheduled group therapies                            3. Medical Issues Being Addressed:    4. Discharge Planning:   -- Social work and case management to assist with discharge planning and identification of hospital follow-up needs prior to discharge  -- Estimated LOS: 3-4 days  Rawson Minix, MD 12/10/2023, 10:57 PM

## 2023-12-10 NOTE — Progress Notes (Signed)
 Patient in bed throughout the shift.  Did not participate in the Unit's activity,a ans will not process with staff.  Present preoccupied.   12/09/23 2300  Psych Admission Type (Psych Patients Only)  Admission Status Voluntary  Psychosocial Assessment  Patient Complaints Anxiety  Eye Contact Fair  Facial Expression Flat  Affect Appropriate to circumstance  Speech Soft  Interaction Guarded  Motor Activity Slow  Appearance/Hygiene Unremarkable  Behavior Characteristics Cooperative  Mood Pleasant  Thought Process  Coherency Circumstantial  Content Preoccupation  Delusions None reported or observed  Perception Hallucinations  Hallucination Auditory  Judgment Impaired  Confusion None  Danger to Self  Current suicidal ideation? Denies  Agreement Not to Harm Self Yes  Description of Agreement Verbal  Danger to Others  Danger to Others None reported or observed

## 2023-12-10 NOTE — Plan of Care (Signed)
   Problem: Education: Goal: Knowledge of Larry Simmons General Education information/materials will improve Outcome: Progressing Goal: Emotional status will improve Outcome: Progressing Goal: Mental status will improve Outcome: Progressing Goal: Verbalization of understanding the information provided will improve Outcome: Progressing   Problem: Activity: Goal: Interest or engagement in activities will improve Outcome: Progressing Goal: Sleeping patterns will improve Outcome: Progressing   Problem: Coping: Goal: Ability to verbalize frustrations and anger appropriately will improve Outcome: Progressing Goal: Ability to demonstrate self-control will improve Outcome: Progressing

## 2023-12-10 NOTE — Plan of Care (Signed)
  Problem: Education: Goal: Emotional status will improve Outcome: Progressing   Problem: Education: Goal: Mental status will improve Outcome: Progressing   Problem: Coping: Goal: Ability to verbalize frustrations and anger appropriately will improve Outcome: Progressing Goal: Ability to demonstrate self-control will improve Outcome: Progressing   Problem: Safety: Goal: Periods of time without injury will increase Outcome: Progressing

## 2023-12-11 NOTE — Progress Notes (Signed)
   12/11/23 0957  Psychosocial Assessment  Patient Complaints Sadness  Eye Contact Fair  Facial Expression Flat  Affect Flat  Speech Soft  Interaction Guarded  Motor Activity Slow  Appearance/Hygiene Unremarkable  Behavior Characteristics Cooperative  Mood Pleasant  Thought Process  Coherency Circumstantial  Content Preoccupation  Delusions None reported or observed  Perception WDL  Hallucination None reported or observed  Judgment Impaired  Confusion None  Danger to Self  Current suicidal ideation? Denies  Agreement Not to Harm Self Yes  Description of Agreement verbal  Danger to Others  Danger to Others None reported or observed

## 2023-12-11 NOTE — Plan of Care (Signed)
   Problem: Education: Goal: Emotional status will improve Outcome: Progressing Goal: Mental status will improve Outcome: Progressing   Problem: Activity: Goal: Interest or engagement in activities will improve Outcome: Progressing

## 2023-12-11 NOTE — Plan of Care (Signed)
 Larry Simmons is a 33 y.o. male patient. No diagnosis found. Past Medical History:  Diagnosis Date   Asthma    Eczema    Mood swings    Current Facility-Administered Medications  Medication Dose Route Frequency Provider Last Rate Last Admin   acetaminophen  (TYLENOL ) tablet 650 mg  650 mg Oral Q6H PRN White, Patrice L, NP       albuterol  (VENTOLIN  HFA) 108 (90 Base) MCG/ACT inhaler 1-2 puff  1-2 puff Inhalation Q6H PRN White, Patrice L, NP       alum & mag hydroxide-simeth (MAALOX/MYLANTA) 200-200-20 MG/5ML suspension 30 mL  30 mL Oral Q4H PRN White, Patrice L, NP       benztropine  (COGENTIN ) tablet 0.5 mg  0.5 mg Oral BID White, Patrice L, NP   0.5 mg at 12/10/23 1741   divalproex  (DEPAKOTE ) DR tablet 500 mg  500 mg Oral BID White, Patrice L, NP   500 mg at 12/10/23 1742   haloperidol  (HALDOL ) tablet 5 mg  5 mg Oral BID White, Patrice L, NP   5 mg at 12/10/23 1742   hydrOXYzine  (ATARAX ) tablet 25 mg  25 mg Oral TID PRN White, Patrice L, NP   25 mg at 12/10/23 2129   magnesium  hydroxide (MILK OF MAGNESIA) suspension 30 mL  30 mL Oral Daily PRN White, Patrice L, NP       OLANZapine  (ZYPREXA ) injection 10 mg  10 mg Intramuscular TID PRN White, Patrice L, NP       OLANZapine  (ZYPREXA ) injection 5 mg  5 mg Intramuscular TID PRN White, Patrice L, NP       OLANZapine  zydis (ZYPREXA ) disintegrating tablet 5 mg  5 mg Oral TID PRN White, Patrice L, NP       traZODone  (DESYREL ) tablet 50 mg  50 mg Oral QHS PRN White, Patrice L, NP   50 mg at 12/10/23 2129   Allergies  Allergen Reactions   Iodinated Contrast Media Other (See Comments)    Irritated skin   Shellfish Allergy Swelling   Peanut-Containing Drug Products Swelling   Principal Problem:   Schizoaffective disorder (HCC)  Blood pressure 109/69, pulse 77, temperature 98.6 F (37 C), temperature source Oral, resp. rate 18, height 6' 1 (1.854 m), weight 88.7 kg, SpO2 100%.  Subjective Objective Assessment & Plan  Larry Simmons  Larry Simmons 12/11/2023

## 2023-12-11 NOTE — Group Note (Signed)
 Date:  12/11/2023 Time:  12:04 PM  Group Topic/Focus:  Emotional Education:   The focus of this group is to discuss what feelings/emotions are, and how they are experienced.    Participation Level:  Did Not Attend    Camellia HERO Salim Forero 12/11/2023, 12:04 PM

## 2023-12-11 NOTE — Progress Notes (Signed)
 [facility] MD Progress Note  12/11/2023 1:59 PM Larry Simmons  MRN:  [id number]  Larry Simmons is a 33 y.o. male patient with a documented history significant for schizoaffective disorder, bipolar type and a medical history of asthma and eczema who presented to the [facility] accompanied by a staff member from the [facility] with complaints of having problems with his ex-girlfriend and thoughts to harm himself and his ex-girlfriend.Patient is admitted to adult psych unit with Q15 min safety monitoring. Multidisciplinary team approach is offered. Medication management; group/milieu therapy is offered.   Subjective:  Chart reviewed, case discussed in multidisciplinary meeting, patient seen during rounds.    12/11/2023: On rounds today, patient is noted to be sleeping in room.He denied any complaints. He denied suicidal or homicidal ideations. He also denied auditory and visual hallucinations. Per nursing report, patient is compliant with all medications currently. He reported he would like to discharge soon. Will continue to monitor for safety concerns relating to the threats he made to his ex-girlfriend prior to admission to the inpatient unit.  12/10/23: Patient is noted to be sleeping in his room.  He offers no complaints.  He reports that he is feeling a lot better being in the hospital.  He reports that he is tolerating the medications with no reported side effects.  He denies SI/HI/plan and denies auditory/visual hallucinations.  Per nursing patient is participating in groups.  12/09/23:On interview patient is noted to be participating in groups.  He came out to talk to the provider.  He offers no complaints.  He denies SI/HI/plan and denies hallucinations.  He reports fair appetite and sleep.  He is tolerating the medications with no reported side effects.  Patient understands that he is being monitored on the inpatient unit for safety concerns and regarding the  threats he made regarding his ex-girlfriend.    Sleep: Fair  Appetite:  Fair  Past Psychiatric History: see h&P Family History: History reviewed. No pertinent family history. Social History:  Social History   Substance and Sexual Activity  Alcohol Use Not Currently   Comment: occassionl     Social History   Substance and Sexual Activity  Drug Use Not Currently   Types: Marijuana   Comment: Last used 04/2022    Social History   Socioeconomic History   Marital status: Single    Spouse name: Not on file   Number of children: 0   Years of education: Not on file   Highest education level: High school graduate  Occupational History   Occupation: Social research officer, government     Comment: Museum/gallery curator  Tobacco Use   Smoking status: Every Day    Current packs/day: 0.50    Average packs/day: 0.5 packs/day for 13.8 years (6.9 ttl pk-yrs)    Types: Cigarettes    Start date: 03/02/2010   Smokeless tobacco: Never   Tobacco comments:    Reports down to 2 a day and trying to quit   Vaping Use   Vaping status: Never Used  Substance and Sexual Activity   Alcohol use: Not Currently    Comment: occassionl   Drug use: Not Currently    Types: Marijuana    Comment: Last used 04/2022   Sexual activity: Not Currently    Comment: Last sexual encounter 04/2022  Other Topics Concern   Not on file  Social History Narrative   Not on file   Social Drivers of Health   Financial Resource Strain: Not  on file  Food Insecurity: No Food Insecurity (12/06/2023)   Hunger Vital Sign    Worried About Running Out of Food in the Last Year: Never true    Ran Out of Food in the Last Year: Never true  Transportation Needs: No Transportation Needs (12/06/2023)   PRAPARE - Administrator, Civil Service (Medical): No    Lack of Transportation (Non-Medical): No  Physical Activity: Not on file  Stress: Not on file  Social Connections: Socially Isolated (12/06/2023)   Social Connection and  Isolation Panel    Frequency of Communication with Friends and Family: Once a week    Frequency of Social Gatherings with Friends and Family: Never    Attends Religious Services: Never    Database administrator or Organizations: No    Attends Engineer, structural: Never    Marital Status: Separated   Past Medical History:  Past Medical History:  Diagnosis Date   Asthma    Eczema    Mood swings    History reviewed. No pertinent surgical history.  Current Medications: Current Facility-Administered Medications  Medication Dose Route Frequency Provider Last Rate Last Admin   acetaminophen  (TYLENOL ) tablet 650 mg  650 mg Oral Q6H PRN White, Patrice L, NP       albuterol  (VENTOLIN  HFA) 108 (90 Base) MCG/ACT inhaler 1-2 puff  1-2 puff Inhalation Q6H PRN White, Patrice L, NP       alum & mag hydroxide-simeth (MAALOX/MYLANTA) 200-200-20 MG/5ML suspension 30 mL  30 mL Oral Q4H PRN White, Patrice L, NP       benztropine  (COGENTIN ) tablet 0.5 mg  0.5 mg Oral BID White, Patrice L, NP   0.5 mg at 12/11/23 0957   divalproex  (DEPAKOTE ) DR tablet 500 mg  500 mg Oral BID White, Patrice L, NP   500 mg at 12/11/23 9042   haloperidol  (HALDOL ) tablet 5 mg  5 mg Oral BID White, Patrice L, NP   5 mg at 12/11/23 9042   hydrOXYzine  (ATARAX ) tablet 25 mg  25 mg Oral TID PRN Teresa Jes L, NP   25 mg at 12/10/23 2129   magnesium  hydroxide (MILK OF MAGNESIA) suspension 30 mL  30 mL Oral Daily PRN White, Patrice L, NP       OLANZapine  (ZYPREXA ) injection 10 mg  10 mg Intramuscular TID PRN White, Patrice L, NP       OLANZapine  (ZYPREXA ) injection 5 mg  5 mg Intramuscular TID PRN White, Patrice L, NP       OLANZapine  zydis (ZYPREXA ) disintegrating tablet 5 mg  5 mg Oral TID PRN White, Patrice L, NP       traZODone  (DESYREL ) tablet 50 mg  50 mg Oral QHS PRN White, Patrice L, NP   50 mg at 12/10/23 2129    Lab Results:  Results for orders placed or performed during the hospital encounter of 12/06/23  (from the past 48 hours)  Valproic acid  level     Status: None   Collection Time: 12/10/23 10:03 AM  Result Value Ref Range   Valproic Acid  Lvl 88 50 - 100 ug/mL    Comment: Performed at [facility], [address] [place], [place] [address]    Blood Alcohol level:  Lab Results  Component Value Date   [facility] <15 12/06/2023   ETH <10 06/10/2023    Metabolic Disorder Labs: Lab Results  Component Value Date   HGBA1C 4.9 12/06/2023   MPG 93.93 12/06/2023  MPG 105.41 06/10/2023   Lab Results  Component Value Date   PROLACTIN 20.2 01/20/2023   PROLACTIN 3.2 (L) 09/08/2021   Lab Results  Component Value Date   CHOL 169 12/06/2023   TRIG 34 12/06/2023   HDL 78 12/06/2023   CHOLHDL 2.2 12/06/2023   VLDL 7 12/06/2023   LDLCALC 84 12/06/2023   LDLCALC 74 06/10/2023    Physical Findings: AIMS:  , ,  ,  ,    CIWA:    COWS:      Psychiatric Specialty Exam:  Presentation  General Appearance:  Appropriate for Environment  Eye Contact: Fair  Speech: Slow  Speech Volume: Decreased    Mood and Affect  Mood: Dysphoric  Affect: Congruent   Thought Process  Thought Processes: Irrevelant; Disorganized  Descriptions of Associations:Circumstantial  Orientation:Full (Time, Place and Person)  Thought Content:Illogical  Hallucinations: Denies Ideas of Reference:None  Suicidal Thoughts: Denies Homicidal Thoughts: Denies  Sensorium  Memory: Fair Judgment: Intact  Insight: Present; Shallow   Executive Functions  Concentration: Fair  Attention Span: Fair  Recall: Poor  Fund of Knowledge: Fair  Language: Fair   Psychomotor Activity  Psychomotor Activity:No data recorded Musculoskeletal: Strength & Muscle Tone: within normal limits Gait & Station: normal Assets  Assets: Manufacturing systems engineer; Desire for Improvement; Social Support; Housing    Physical Exam: Physical Exam Vitals and nursing note reviewed.    Review  of Systems  Psychiatric/Behavioral:  Negative for depression, hallucinations and suicidal ideas.    Blood pressure (!) 89/53, pulse (!) 52, temperature (!) 97.3 F (36.3 C), resp. rate 20, height 6' 1 (1.854 m), weight 88.7 kg, SpO2 100%. Body mass index is 25.81 kg/m.  Diagnosis: Principal Problem:   Schizoaffective disorder (HCC) Bipolar type  Clinical Decision Making: Patient is currently admitted for disorganized behavior.  Patient will be monitored closely for optimization of medication and safety monitoring.  Treatment Plan Summary:  Safety and Monitoring:             -- Voluntary admission to inpatient psychiatric unit for safety, stabilization and treatment             -- Daily contact with patient to assess and evaluate symptoms and progress in treatment             -- Patient's case to be discussed in multi-disciplinary team meeting             -- Observation Level: q15 minute checks             -- Vital signs:  q12 hours             -- Precautions: suicide, elopement, and assault   2. Psychiatric Diagnoses and Treatment:                 Haldol  5 mg twice daily Depakote  500 mg twice daily- 12/08/23 Depakote  levels- 88 Cogentin  -- The risks/benefits/side-effects/alternatives to this medication were discussed in detail with the patient and time was given for questions. The patient consents to medication trial.                -- Metabolic profile and EKG monitoring obtained while on an atypical antipsychotic (BMI: Lipid Panel: HbgA1c: QTc:)              -- Encouraged patient to participate in unit milieu and in scheduled group therapies  3. Medical Issues Being Addressed:    4. Discharge Planning:   -- Social work and case management to assist with discharge planning and identification of hospital follow-up needs prior to discharge  -- Estimated LOS: 3-4 days  Zelda Sharps, NP

## 2023-12-12 MED ORDER — HALOPERIDOL 5 MG PO TABS: 5.0000 mg | ORAL_TABLET | Freq: Two times a day (BID) | ORAL | 0 refills | Status: DC

## 2023-12-12 MED ORDER — DIVALPROEX SODIUM 500 MG PO DR TAB: 500.0000 mg | DELAYED_RELEASE_TABLET | Freq: Two times a day (BID) | ORAL | 0 refills | Status: DC

## 2023-12-12 MED ORDER — TRAZODONE HCL 50 MG PO TABS
50.0000 mg | ORAL_TABLET | Freq: Every evening | ORAL | 0 refills | Status: DC | PRN
Start: 2023-12-12 — End: 2023-12-23

## 2023-12-12 MED ORDER — BENZTROPINE MESYLATE 0.5 MG PO TABS: 0.5000 mg | ORAL_TABLET | Freq: Two times a day (BID) | ORAL | 0 refills | Status: DC

## 2023-12-12 NOTE — Progress Notes (Signed)
   12/12/23 0809  Psychosocial Assessment  Patient Complaints Sadness  Eye Contact Fair  Facial Expression Flat  Affect Flat  Speech Soft  Interaction Guarded  Motor Activity Slow  Appearance/Hygiene Unremarkable  Behavior Characteristics Cooperative  Mood Pleasant  Thought Process  Coherency WDL  Content WDL  Delusions None reported or observed  Perception WDL  Hallucination None reported or observed  Judgment Impaired  Confusion None  Danger to Self  Current suicidal ideation? Denies  Agreement Not to Harm Self Yes  Description of Agreement verbal  Danger to Others  Danger to Others None reported or observed

## 2023-12-12 NOTE — Progress Notes (Signed)
 [facility] MD Progress Note  12/12/2023 5:24 PM Larry Simmons  MRN:  [id number]  Larry Simmons is a 33 y.o. male patient with a documented history significant for schizoaffective disorder, bipolar type and a medical history of asthma and eczema who presented to the [facility] accompanied by a staff member from the [facility] with complaints of having problems with his ex-girlfriend and thoughts to harm himself and his ex-girlfriend.Patient is admitted to adult psych unit with Q15 min safety monitoring. Multidisciplinary team approach is offered. Medication management; group/milieu therapy is offered.   Subjective:  Chart reviewed, case discussed in multidisciplinary meeting, patient seen during rounds.    12/12/2023: On rounds today, patient is noted to be sleeping in room.He continued to deny any complaints. He denied suicidal or homicidal ideations. He also denied auditory and visual hallucinations. Per nursing report, patient is compliant with all medications currently.We discussed discharging tomorrow to which patient verbalized understanding and agreement too. He denied having any feelings of wanting to hurt his ex-girlfriend. SW team to arrange for safe discharge/aftercare plan for patient.  12/11/2023: On rounds today, patient is noted to be sleeping in room.He denied any complaints. He denied suicidal or homicidal ideations. He also denied auditory and visual hallucinations. Per nursing report, patient is compliant with all medications currently. He reported he would like to discharge soon. Will continue to monitor for safety concerns relating to the threats he made to his ex-girlfriend prior to admission to the inpatient unit.  12/10/23: Patient is noted to be sleeping in his room.  He offers no complaints.  He reports that he is feeling a lot better being in the hospital.  He reports that he is tolerating the medications with no reported side effects.  He  denies SI/HI/plan and denies auditory/visual hallucinations.  Per nursing patient is participating in groups.  12/09/23:On interview patient is noted to be participating in groups.  He came out to talk to the provider.  He offers no complaints.  He denies SI/HI/plan and denies hallucinations.  He reports fair appetite and sleep.  He is tolerating the medications with no reported side effects.  Patient understands that he is being monitored on the inpatient unit for safety concerns and regarding the threats he made regarding his ex-girlfriend.    Sleep: Fair  Appetite:  Fair  Past Psychiatric History: see h&P Family History: History reviewed. No pertinent family history. Social History:  Social History   Substance and Sexual Activity  Alcohol Use Not Currently   Comment: occassionl     Social History   Substance and Sexual Activity  Drug Use Not Currently   Types: Marijuana   Comment: Last used 04/2022    Social History   Socioeconomic History   Marital status: Single    Spouse name: Not on file   Number of children: 0   Years of education: Not on file   Highest education level: High school graduate  Occupational History   Occupation: Social research officer, government     Comment: Museum/gallery curator  Tobacco Use   Smoking status: Every Day    Current packs/day: 0.50    Average packs/day: 0.5 packs/day for 13.8 years (6.9 ttl pk-yrs)    Types: Cigarettes    Start date: 03/02/2010   Smokeless tobacco: Never   Tobacco comments:    Reports down to 2 a day and trying to quit   Vaping Use   Vaping status: Never Used  Substance and Sexual Activity  Alcohol use: Not Currently    Comment: occassionl   Drug use: Not Currently    Types: Marijuana    Comment: Last used 04/2022   Sexual activity: Not Currently    Comment: Last sexual encounter 04/2022  Other Topics Concern   Not on file  Social History Narrative   Not on file   Social Drivers of Health   Financial Resource Strain: Not on  file  Food Insecurity: No Food Insecurity (12/06/2023)   Hunger Vital Sign    Worried About Running Out of Food in the Last Year: Never true    Ran Out of Food in the Last Year: Never true  Transportation Needs: No Transportation Needs (12/06/2023)   PRAPARE - Administrator, Civil Service (Medical): No    Lack of Transportation (Non-Medical): No  Physical Activity: Not on file  Stress: Not on file  Social Connections: Socially Isolated (12/06/2023)   Social Connection and Isolation Panel    Frequency of Communication with Friends and Family: Once a week    Frequency of Social Gatherings with Friends and Family: Never    Attends Religious Services: Never    Database administrator or Organizations: No    Attends Engineer, structural: Never    Marital Status: Separated   Past Medical History:  Past Medical History:  Diagnosis Date   Asthma    Eczema    Mood swings    History reviewed. No pertinent surgical history.  Current Medications: Current Facility-Administered Medications  Medication Dose Route Frequency Provider Last Rate Last Admin   acetaminophen  (TYLENOL ) tablet 650 mg  650 mg Oral Q6H PRN White, Patrice L, NP       albuterol  (VENTOLIN  HFA) 108 (90 Base) MCG/ACT inhaler 1-2 puff  1-2 puff Inhalation Q6H PRN White, Patrice L, NP       alum & mag hydroxide-simeth (MAALOX/MYLANTA) 200-200-20 MG/5ML suspension 30 mL  30 mL Oral Q4H PRN White, Patrice L, NP       benztropine  (COGENTIN ) tablet 0.5 mg  0.5 mg Oral BID White, Patrice L, NP   0.5 mg at 12/12/23 1700   divalproex  (DEPAKOTE ) DR tablet 500 mg  500 mg Oral BID White, Patrice L, NP   500 mg at 12/12/23 1700   haloperidol  (HALDOL ) tablet 5 mg  5 mg Oral BID White, Patrice L, NP   5 mg at 12/12/23 1700   hydrOXYzine  (ATARAX ) tablet 25 mg  25 mg Oral TID PRN White, Patrice L, NP   25 mg at 12/10/23 2129   magnesium  hydroxide (MILK OF MAGNESIA) suspension 30 mL  30 mL Oral Daily PRN White, Patrice L, NP    30 mL at 12/12/23 0813   OLANZapine  (ZYPREXA ) injection 10 mg  10 mg Intramuscular TID PRN White, Patrice L, NP       OLANZapine  (ZYPREXA ) injection 5 mg  5 mg Intramuscular TID PRN White, Patrice L, NP       OLANZapine  zydis (ZYPREXA ) disintegrating tablet 5 mg  5 mg Oral TID PRN White, Patrice L, NP       traZODone  (DESYREL ) tablet 50 mg  50 mg Oral QHS PRN White, Patrice L, NP   50 mg at 12/11/23 2118    Lab Results:  No results found for this or any previous visit (from the past 48 hours).   Blood Alcohol level:  Lab Results  Component Value Date   [facility] <15 12/06/2023   ETH <10 06/10/2023  Metabolic Disorder Labs: Lab Results  Component Value Date   HGBA1C 4.9 12/06/2023   MPG 93.93 12/06/2023   MPG 105.41 06/10/2023   Lab Results  Component Value Date   PROLACTIN 20.2 01/20/2023   PROLACTIN 3.2 (L) 09/08/2021   Lab Results  Component Value Date   CHOL 169 12/06/2023   TRIG 34 12/06/2023   HDL 78 12/06/2023   CHOLHDL 2.2 12/06/2023   VLDL 7 12/06/2023   LDLCALC 84 12/06/2023   LDLCALC 74 06/10/2023    Physical Findings: AIMS:  , ,  ,  ,    CIWA:    COWS:      Psychiatric Specialty Exam:  Presentation  General Appearance:  Appropriate for Environment  Eye Contact: Fair  Speech: Slow  Speech Volume: Normal    Mood and Affect  Mood: Euthymic  Affect: Congruent   Thought Process  Thought Processes: Coherent  Descriptions of Associations:Circumstantial  Orientation:Full (Time, Place and Person)  Thought Content:WDL  Hallucinations: Denies Ideas of Reference:None  Suicidal Thoughts: Denies Homicidal Thoughts: Denies  Sensorium  Memory: Fair Judgment: Fair  Insight: Present; Shallow   Executive Functions  Concentration: Fair  Attention Span: Fair  Recall: [employer] of Knowledge: Poor  Language: Fair   Psychomotor Activity  Psychomotor Activity:Psychomotor Activity: Normal  Musculoskeletal: Strength &  Muscle Tone: within normal limits Gait & Station: normal Assets  Assets: Manufacturing systems engineer; Desire for Improvement; Social Support; Housing    Physical Exam: Physical Exam Vitals and nursing note reviewed.    Review of Systems  Psychiatric/Behavioral:  Negative for depression, hallucinations and suicidal ideas. The patient does not have insomnia.    Blood pressure 101/70, pulse 77, temperature (!) 97.3 F (36.3 C), resp. rate 18, height 6' 1 (1.854 m), weight 88.7 kg, SpO2 99%. Body mass index is 25.81 kg/m.  Diagnosis: Principal Problem:   Schizoaffective disorder (HCC) Bipolar type  Clinical Decision Making: Patient is currently admitted for disorganized behavior.  Patient will be monitored closely for optimization of medication and safety monitoring.  Treatment Plan Summary:  Safety and Monitoring:             -- Voluntary admission to inpatient psychiatric unit for safety, stabilization and treatment             -- Daily contact with patient to assess and evaluate symptoms and progress in treatment             -- Patient's case to be discussed in multi-disciplinary team meeting             -- Observation Level: q15 minute checks             -- Vital signs:  q12 hours             -- Precautions: suicide, elopement, and assault   2. Psychiatric Diagnoses and Treatment:                 Haldol  5 mg twice daily Depakote  500 mg twice daily- 12/08/23 Depakote  levels- 88 Cogentin  -- The risks/benefits/side-effects/alternatives to this medication were discussed in detail with the patient and time was given for questions. The patient consents to medication trial.                -- Metabolic profile and EKG monitoring obtained while on an atypical antipsychotic (BMI: Lipid Panel: HbgA1c: QTc:)              -- Encouraged patient to participate in unit milieu  and in scheduled group therapies                            3. Medical Issues Being Addressed:    4. Discharge  Planning:   -- Social work and case management to assist with discharge planning and identification of hospital follow-up needs prior to discharge  -- Estimated LOS: 3-4 days  Zelda Sharps, NP

## 2023-12-12 NOTE — Group Note (Signed)
 LCSW Group Therapy Note   Group Date: 12/12/2023 Start Time: 1355 End Time: 1445   Type of Therapy and Topic:  Group Therapy: Change and Accountability  Participation Level:  Did Not Attend     Summary of Patient Progress:  Patient did not attend group.    Roselyn GORMAN Lento, LCSWA 12/12/2023  4:23 PM

## 2023-12-12 NOTE — Plan of Care (Signed)
  Problem: Education: Goal: Emotional status will improve Outcome: Progressing Goal: Mental status will improve 12/12/2023 2010 by Zachary Titus BIRCH, RN Outcome: Progressing 12/12/2023 1657 by Zachary Titus BIRCH, RN Outcome: Progressing

## 2023-12-12 NOTE — Group Note (Signed)
 Date:  12/12/2023 Time:  5:07 PM  Group Topic/Focus:  Diagnosis Education:   The focus of this group is to discuss the major disorders that patients maybe diagnosed with.  Group discusses the importance of knowing what one's diagnosis is so that one can understand treatment and better advocate for oneself.    Participation Level:  Did Not Attend   Larry Simmons 12/12/2023, 5:07 PM

## 2023-12-12 NOTE — Plan of Care (Signed)
   Problem: Education: Goal: Emotional status will improve Outcome: Progressing Goal: Mental status will improve Outcome: Progressing

## 2023-12-12 NOTE — [facility] Suicide Risk Assessment (Cosign Needed)
 [facility] Discharge Suicide Risk Assessment   Principal Problem: Schizoaffective disorder [facility]) Discharge Diagnoses: Principal Problem:   Schizoaffective disorder (HCC)   Total Time spent with patient: 30 minutes  Musculoskeletal: Strength & Muscle Tone: within normal limits Gait & Station: normal Patient leans: N/A  Psychiatric Specialty Exam  Presentation  General Appearance:  Appropriate for Environment  Eye Contact: Fair  Speech: Slow  Speech Volume: Normal  Handedness: Right   Mood and Affect  Mood: Euthymic  Duration of Depression Symptoms: Greater than two weeks  Affect: Congruent   Thought Process   Descriptions of Associations:Circumstantial  Orientation:Full (Time, Place and Person)  Thought Content:Denies suicidal or homicidal ideations. Denied command hallucinations  History of Schizophrenia/Schizoaffective disorder:Yes  Duration of Psychotic Symptoms:Greater than six months  Hallucinations:Hallucinations: -- (denied)  Ideas of Reference:None  Suicidal Thoughts:Suicidal Thoughts: No  Homicidal Thoughts:Homicidal Thoughts: No   Sensorium  Memory: Immediate Fair; Recent Fair; Remote Fair  Judgment: Fair  Insight: Present; Shallow   Executive Functions  Concentration: Fair  Attention Span: Fair  Recall: [employer] of Knowledge: Poor  Language: Fair   Psychomotor Activity  Psychomotor Activity: Psychomotor Activity: Normal   Assets  Assets: Communication Skills; Desire for Improvement; Social Support; Housing   Sleep  Sleep: Sleep: Good  Estimated Sleeping Duration (Last 24 Hours): 8.50-11.25 hours  Physical Exam: Physical Exam HENT:     Head: Normocephalic.  Pulmonary:     Effort: Pulmonary effort is normal.  Neurological:     Mental Status: He is alert.    Review of Systems  Constitutional:  Negative for fever.  Respiratory:  Negative for shortness of breath.   Cardiovascular:  Negative for  chest pain.  Gastrointestinal:  Negative for nausea and vomiting.  Neurological:  Negative for headaches.  Psychiatric/Behavioral:  Negative for depression, hallucinations and suicidal ideas.    Blood pressure 101/70, pulse 77, temperature (!) 97.3 F (36.3 C), resp. rate 18, height 6' 1 (1.854 m), weight 88.7 kg, SpO2 99%. Body mass index is 25.81 kg/m.  Mental Status Per Nursing Assessment::   On Admission:  Suicidal ideation indicated by others  Demographic Factors:  Male and Unemployed  Loss Factors: Relationship strain with ex-girlfriend  Historical Factors: Impulsivity  Risk Reduction Factors:   Religious beliefs about death, Living with another person, especially a relative, and Positive social support  Continued Clinical Symptoms:  Schizophrenia:   Paranoid or undifferentiated type  Cognitive Features That Contribute To Risk:  Loss of executive function    Suicide Risk:  Minimal: No identifiable suicidal ideation.  Patients presenting with no risk factors but with morbid ruminations; may be classified as minimal risk based on the severity of the depressive symptoms    Plan Of Care/Follow-up recommendations:  Activity:  increase as tolerated Diet:  Return to normal as tolerated  Zelda Sharps, NP

## 2023-12-12 NOTE — Progress Notes (Signed)
   12/11/23 2000  Psych Admission Type (Psych Patients Only)  Admission Status Involuntary  Psychosocial Assessment  Patient Complaints Restlessness  Eye Contact Brief  Facial Expression Anxious  Affect Flat  Speech Soft  Interaction Forwards little;Guarded  Motor Activity Slow  Appearance/Hygiene Unremarkable  Behavior Characteristics Cooperative  Mood Pleasant  Thought Process  Coherency Circumstantial  Content Religiosity  Delusions None reported or observed  Perception WDL  Hallucination None reported or observed  Judgment Impaired  Confusion None  Danger to Self  Current suicidal ideation? Denies  Agreement Not to Harm Self Yes  Description of Agreement verbal  Danger to Others  Danger to Others None reported or observed

## 2023-12-12 NOTE — Progress Notes (Signed)
   12/12/23 2003  Psychosocial Assessment  Patient Complaints Sadness  Eye Contact Fair  Facial Expression Flat  Affect Flat  Speech Soft  Interaction Guarded  Motor Activity Slow  Appearance/Hygiene Unremarkable  Behavior Characteristics Cooperative  Mood Pleasant  Thought Process  Coherency WDL  Content WDL  Delusions None reported or observed  Perception WDL  Hallucination None reported or observed  Judgment Impaired  Confusion None  Danger to Self  Current suicidal ideation? Denies  Agreement Not to Harm Self Yes  Description of Agreement verbal  Danger to Others  Danger to Others None reported or observed

## 2023-12-12 NOTE — Group Note (Signed)
 Date:  12/12/2023 Time:  8:50 PM  Group Topic/Focus:  Making Healthy Choices:   The focus of this group is to help patients identify negative/unhealthy choices they were using prior to admission and identify positive/healthier coping strategies to replace them upon discharge. Personal Choices and Values:   The focus of this group is to help patients assess and explore the importance of values in their lives, how their values affect their decisions, how they express their values and what opposes their expression. Self Care:   The focus of this group is to help patients understand the importance of self-care in order to improve or restore emotional, physical, spiritual, interpersonal, and financial health.    Participation Level:  Active  Participation Quality:  Appropriate and Attentive  Affect:  Appropriate  Cognitive:  Alert, Appropriate, and Oriented  Insight: Appropriate and Good  Engagement in Group:  Engaged  Modes of Intervention:  Discussion and Support  Additional Comments:  N/A  Larry Simmons 12/12/2023, 8:50 PM

## 2023-12-13 DIAGNOSIS — F259 Schizoaffective disorder, unspecified: Secondary | ICD-10-CM

## 2023-12-13 NOTE — Group Note (Signed)
 Date:  12/13/2023 Time:  8:59 PM  Group Topic/Focus:  Wrap-Up Group:   The focus of this group is to help patients review their daily goal of treatment and discuss progress on daily workbooks.    Participation Level:  Did Not Attend  Participation Quality:  Did not attend  Affect:  did not attend  Cognitive:  did not attend  Insight: None  Engagement in Group:  None  Modes of Intervention:  did not attend  Additional Comments:    Leigh VEAR Pais 12/13/2023, 8:59 PM

## 2023-12-13 NOTE — Progress Notes (Addendum)
 [facility] MD Progress Note  12/13/2023 11:30 AM Larry Simmons  MRN:  [id number]  Larry Simmons is a 33 y.o. male patient with a documented history significant for schizoaffective disorder, bipolar type and a medical history of asthma and eczema who presented to the [facility] accompanied by a staff member from the [facility] with complaints of having problems with his ex-girlfriend and thoughts to harm himself and his ex-girlfriend.Patient is admitted to adult psych unit with Q15 min safety monitoring. Multidisciplinary team approach is offered. Medication management; group/milieu therapy is offered.   Subjective:  Chart reviewed, case discussed in multidisciplinary meeting, patient seen during rounds.    9/22 On follow-up patient is alert and oriented.  They are pleasant and cooperative on exam.  They deny adverse effects of medications.  They deny SI, HI, and AVH.  They voiced no concerns or complaints at this time.  They demonstrate good insight into the need for outpatient follow-up and medication compliance. They are planning to discharge to [facility] house to morrow and are agreeable with this plan. No behavioral prns overnight. Anxiety/depression 0/10  12/12/2023: On rounds today, patient is noted to be sleeping in room.He continued to deny any complaints. He denied suicidal or homicidal ideations. He also denied auditory and visual hallucinations. Per nursing report, patient is compliant with all medications currently.We discussed discharging tomorrow to which patient verbalized understanding and agreement too. He denied having any feelings of wanting to hurt his ex-girlfriend. SW team to arrange for safe discharge/aftercare plan for patient.  12/11/2023: On rounds today, patient is noted to be sleeping in room.He denied any complaints. He denied suicidal or homicidal ideations. He also denied auditory and visual hallucinations. Per nursing report, patient is  compliant with all medications currently. He reported he would like to discharge soon. Will continue to monitor for safety concerns relating to the threats he made to his ex-girlfriend prior to admission to the inpatient unit.  12/10/23: Patient is noted to be sleeping in his room.  He offers no complaints.  He reports that he is feeling a lot better being in the hospital.  He reports that he is tolerating the medications with no reported side effects.  He denies SI/HI/plan and denies auditory/visual hallucinations.  Per nursing patient is participating in groups.  12/09/23:On interview patient is noted to be participating in groups.  He came out to talk to the provider.  He offers no complaints.  He denies SI/HI/plan and denies hallucinations.  He reports fair appetite and sleep.  He is tolerating the medications with no reported side effects.  Patient understands that he is being monitored on the inpatient unit for safety concerns and regarding the threats he made regarding his ex-girlfriend.    Sleep: Fair  Appetite:  Fair  Past Psychiatric History: see h&P Family History: History reviewed. No pertinent family history. Social History:  Social History   Substance and Sexual Activity  Alcohol Use Not Currently   Comment: occassionl     Social History   Substance and Sexual Activity  Drug Use Not Currently   Types: Marijuana   Comment: Last used 04/2022    Social History   Socioeconomic History   Marital status: Single    Spouse name: Not on file   Number of children: 0   Years of education: Not on file   Highest education level: High school graduate  Occupational History   Occupation: Social research officer, government     Comment: Financial planner  [employer]  Tobacco Use   Smoking status: Every Day    Current packs/day: 0.50    Average packs/day: 0.5 packs/day for 13.8 years (6.9 ttl pk-yrs)    Types: Cigarettes    Start date: 03/02/2010   Smokeless tobacco: Never   Tobacco comments:    Reports down  to 2 a day and trying to quit   Vaping Use   Vaping status: Never Used  Substance and Sexual Activity   Alcohol use: Not Currently    Comment: occassionl   Drug use: Not Currently    Types: Marijuana    Comment: Last used 04/2022   Sexual activity: Not Currently    Comment: Last sexual encounter 04/2022  Other Topics Concern   Not on file  Social History Narrative   Not on file   Social Drivers of Health   Financial Resource Strain: Not on file  Food Insecurity: No Food Insecurity (12/06/2023)   Hunger Vital Sign    Worried About Running Out of Food in the Last Year: Never true    Ran Out of Food in the Last Year: Never true  Transportation Needs: No Transportation Needs (12/06/2023)   PRAPARE - Administrator, Civil Service (Medical): No    Lack of Transportation (Non-Medical): No  Physical Activity: Not on file  Stress: Not on file  Social Connections: Socially Isolated (12/06/2023)   Social Connection and Isolation Panel    Frequency of Communication with Friends and Family: Once a week    Frequency of Social Gatherings with Friends and Family: Never    Attends Religious Services: Never    Database administrator or Organizations: No    Attends Engineer, structural: Never    Marital Status: Separated   Past Medical History:  Past Medical History:  Diagnosis Date   Asthma    Eczema    Mood swings    History reviewed. No pertinent surgical history.  Current Medications: Current Facility-Administered Medications  Medication Dose Route Frequency Provider Last Rate Last Admin   acetaminophen  (TYLENOL ) tablet 650 mg  650 mg Oral Q6H PRN White, Patrice L, NP       albuterol  (VENTOLIN  HFA) 108 (90 Base) MCG/ACT inhaler 1-2 puff  1-2 puff Inhalation Q6H PRN White, Patrice L, NP       alum & mag hydroxide-simeth (MAALOX/MYLANTA) 200-200-20 MG/5ML suspension 30 mL  30 mL Oral Q4H PRN White, Patrice L, NP       benztropine  (COGENTIN ) tablet 0.5 mg  0.5 mg  Oral BID White, Patrice L, NP   0.5 mg at 12/13/23 0804   divalproex  (DEPAKOTE ) DR tablet 500 mg  500 mg Oral BID White, Patrice L, NP   500 mg at 12/13/23 0804   haloperidol  (HALDOL ) tablet 5 mg  5 mg Oral BID White, Patrice L, NP   5 mg at 12/13/23 0804   hydrOXYzine  (ATARAX ) tablet 25 mg  25 mg Oral TID PRN White, Patrice L, NP   25 mg at 12/10/23 2129   magnesium  hydroxide (MILK OF MAGNESIA) suspension 30 mL  30 mL Oral Daily PRN White, Patrice L, NP   30 mL at 12/12/23 0813   OLANZapine  (ZYPREXA ) injection 10 mg  10 mg Intramuscular TID PRN White, Patrice L, NP       OLANZapine  (ZYPREXA ) injection 5 mg  5 mg Intramuscular TID PRN White, Patrice L, NP       OLANZapine  zydis (ZYPREXA ) disintegrating tablet 5 mg  5 mg  Oral TID PRN White, Patrice L, NP       traZODone  (DESYREL ) tablet 50 mg  50 mg Oral QHS PRN White, Patrice L, NP   50 mg at 12/11/23 2118    Lab Results:  No results found for this or any previous visit (from the past 48 hours).   Blood Alcohol level:  Lab Results  Component Value Date   [facility] <15 12/06/2023   ETH <10 06/10/2023    Metabolic Disorder Labs: Lab Results  Component Value Date   HGBA1C 4.9 12/06/2023   MPG 93.93 12/06/2023   MPG 105.41 06/10/2023   Lab Results  Component Value Date   PROLACTIN 20.2 01/20/2023   PROLACTIN 3.2 (L) 09/08/2021   Lab Results  Component Value Date   CHOL 169 12/06/2023   TRIG 34 12/06/2023   HDL 78 12/06/2023   CHOLHDL 2.2 12/06/2023   VLDL 7 12/06/2023   LDLCALC 84 12/06/2023   LDLCALC 74 06/10/2023    Physical Findings: AIMS:  , ,  ,  ,    CIWA:    COWS:      Psychiatric Specialty Exam:  Presentation  General Appearance:  Casual  Eye Contact: Minimal  Speech: Normal Rate  Speech Volume: Normal    Mood and Affect  Mood: Euphoric  Affect: Congruent   Thought Process  Thought Processes: Coherent; Linear  Descriptions of Associations:Intact  Orientation:Full (Time, Place and  Person)  Thought Content:Logical  Hallucinations: Denies Ideas of Reference:None  Suicidal Thoughts: Denies Homicidal Thoughts: Denies  Sensorium  Memory: Fair Judgment: Fair  Insight: Present   Executive Functions  Concentration: Fair  Attention Span: Fair  Recall: Fair  Fund of Knowledge: Fair  Language: Fair   Psychomotor Activity  Psychomotor Activity:Psychomotor Activity: Normal  Musculoskeletal: Strength & Muscle Tone: within normal limits Gait & Station: normal Assets  Assets: Manufacturing systems engineer; Desire for Improvement    Physical Exam: Physical Exam Vitals and nursing note reviewed.  HENT:     Head: Atraumatic.  Eyes:     Extraocular Movements: Extraocular movements intact.  Pulmonary:     Effort: Pulmonary effort is normal.  Neurological:     Mental Status: He is alert and oriented to person, place, and time.    Review of Systems  Psychiatric/Behavioral:  Negative for depression, hallucinations and suicidal ideas. The patient does not have insomnia.    Blood pressure 100/72, pulse 74, temperature (!) 97.2 F (36.2 C), resp. rate 18, height 6' 1 (1.854 m), weight 88.7 kg, SpO2 100%. Body mass index is 25.81 kg/m.  Diagnosis: Principal Problem:   Schizoaffective disorder (HCC) Bipolar type  Clinical Decision Making: Patient is currently admitted for disorganized behavior.  Patient will be monitored closely for optimization of medication and safety monitoring.  Treatment Plan Summary:  Safety and Monitoring:             -- Voluntary admission to inpatient psychiatric unit for safety, stabilization and treatment             -- Daily contact with patient to assess and evaluate symptoms and progress in treatment             -- Patient's case to be discussed in multi-disciplinary team meeting             -- Observation Level: q15 minute checks             -- Vital signs:  q12 hours             --  Precautions: suicide, elopement,  and assault   2. Psychiatric Diagnoses and Treatment:                 Haldol  5 mg twice daily Depakote  500 mg twice daily- 12/08/23 Depakote  levels- 88, depakote  level ordered for am Cogentin  0.5 mg bid -- The risks/benefits/side-effects/alternatives to this medication were discussed in detail with the patient and time was given for questions. The patient consents to medication trial.                -- Metabolic profile and EKG monitoring obtained while on an atypical antipsychotic (BMI: Lipid Panel: HbgA1c: QTc:)              -- Encouraged patient to participate in unit milieu and in scheduled group therapies                            3. Medical Issues Being Addressed:    4. Discharge Planning:   -- Social work and case management to assist with discharge planning and identification of hospital follow-up needs prior to discharge  -- Estimated LOS: 3-4 days

## 2023-12-13 NOTE — Group Note (Signed)
 Recreation Therapy Group Note   Group Topic:Other  Group Date: 12/13/2023 Start Time: 1455 End Time: 1540 Facilitators: Celestia Jeoffrey FORBES ARTICE, CTRS Location: Craft Room  Activity Description/Intervention: Therapeutic Drumming. Patients with peers and staff were given the opportunity to engage in a leader facilitated [facility] Group Empowerment Drumming Circle with staff from the [employer], in partnership with [employer]. Teaching laboratory technician and trained [employer], Norleen Mon leading with LRT observing and documenting intervention and pt response. This evidenced-based practice targets 7 areas of health and wellbeing in the human experience including: stress-reduction, exercise, self-expression, camaraderie/support, nurturing, spirituality, and music-making (leisure).    Goal Area(s) Addresses:  Patient will engage in pro-social way in music group.  Patient will follow directions of drum leader on the first prompt. Patient will demonstrate no behavioral issues during group.  Patient will identify if a reduction in stress level occurs as a result of participation in therapeutic drum circle.     Affect/Mood: Appropriate   Participation Level: Active and Engaged   Participation Quality: Independent   Behavior: Appropriate, Calm, and Cooperative   Speech/Thought Process: Coherent   Insight: Good   Judgement: Good   Modes of Intervention: Music   Patient Response to Interventions:  Attentive, Engaged, Interested , and Receptive   Education Outcome:  Acknowledges education   Clinical Observations/Individualized Feedback: Ova was active in their participation of session activities and group discussion.   Plan: Continue to engage patient in RT group sessions 2-3x/week.   Jeoffrey FORBES Celestia, LRT, CTRS 12/13/2023 4:55 PM

## 2023-12-13 NOTE — Group Note (Signed)
 LCSW Group Therapy Note  Group Date: 12/13/2023 Start Time: 1300 End Time: 1400   Type of Therapy and Topic:  Group Therapy - How To Cope with Nervousness about Discharge   Participation Level:  Did Not Attend   Description of Group This process group involved identification of patients' feelings about discharge. Some of them are scheduled to be discharged soon, while others are new admissions, but each of them was asked to share thoughts and feelings surrounding discharge from the hospital. One common theme was that they are excited at the prospect of going home, while another was that many of them are apprehensive about sharing why they were hospitalized. Patients were given the opportunity to discuss these feelings with their peers in preparation for discharge.  Therapeutic Goals  Patient will identify their overall feelings about pending discharge. Patient will think about how they might proactively address issues that they believe will once again arise once they get home (i.e. with parents). Patients will participate in discussion about having hope for change.   Summary of Patient Progress:   X  Therapeutic Modalities Cognitive Behavioral Therapy   Sherryle JINNY Margo, LCSW 12/13/2023  3:18 PM

## 2023-12-13 NOTE — Progress Notes (Signed)
 Pt calm and pleasant during assessment denying SI/HI/AVH. Pt stated he had a good day and was glad to be here getting help. Pt observed by this Clinical research associate interacting appropriately with staff and peers on the unit. Pt compliant with medication administration per MD orders. Pt given education, support, and encouragement to be active in his treatment plan. Pt being monitored Q 15 minutes for safety per unit protocol, remains safe on the unit

## 2023-12-13 NOTE — Group Note (Signed)
 Recreation Therapy Group Note   Group Topic:Health and Wellness  Group Date: 12/13/2023 Start Time: 1100 End Time: 1135 Facilitators: Celestia Jeoffrey BRAVO, LRT, CTRS Location: Courtyard  Group Description: [employer]. LRT and patients played games of basketball, drew with chalk, and played corn hole while outside in the courtyard while getting fresh air and sunlight. Music was being played in the background. LRT and peers conversed about different games they have played before, what they do in their free time and anything else that is on their minds. LRT encouraged pts to drink water after being outside, sweating and getting their heart rate up.  Goal Area(s) Addressed: Patient will build on frustration tolerance skills. Patients will partake in a competitive play game with peers. Patients will gain knowledge of new leisure interest/hobby.    Affect/Mood: N/A   Participation Level: Did not attend    Clinical Observations/Individualized Feedback: Patient did not attend group.   Plan: Continue to engage patient in RT group sessions 2-3x/week.   Jeoffrey BRAVO Celestia, LRT, CTRS 12/13/2023 11:56 AM

## 2023-12-13 NOTE — Progress Notes (Signed)
   12/13/23 0900  Psych Admission Type (Psych Patients Only)  Admission Status Involuntary  Psychosocial Assessment  Patient Complaints None  Eye Contact Fair  Facial Expression Other (Comment) (WNL)  Affect Appropriate to circumstance  Speech Soft  Interaction Assertive  Motor Activity Slow  Appearance/Hygiene Unremarkable  Behavior Characteristics Cooperative  Mood Pleasant  Thought Process  Coherency WDL  Content WDL  Delusions None reported or observed  Perception WDL  Hallucination None reported or observed  Judgment Impaired  Confusion None  Danger to Self  Current suicidal ideation? Denies  Agreement Not to Harm Self Yes  Description of Agreement verbal  Danger to Others  Danger to Others None reported or observed

## 2023-12-13 NOTE — Group Note (Signed)
 Date:  12/13/2023 Time:  3:37 PM  Group Topic/Focus:  Goals Group:   The focus of this group is to help patients establish daily goals to achieve during treatment and discuss how the patient can incorporate goal setting into their daily lives to aide in recovery.    Participation Level:  Did Not Attend  P Yuan Gann CHRISTELLA Hover 12/13/2023, 3:37 PM

## 2023-12-13 NOTE — Plan of Care (Signed)
  Problem: Education: Goal: Mental status will improve Outcome: Progressing Goal: Verbalization of understanding the information provided will improve Outcome: Progressing   Problem: Activity: Goal: Interest or engagement in activities will improve Outcome: Progressing   Problem: Health Behavior/Discharge Planning: Goal: Identification of resources available to assist in meeting health care needs will improve Outcome: Progressing Goal: Compliance with treatment plan for underlying cause of condition will improve Outcome: Progressing   Problem: Physical Regulation: Goal: Ability to maintain clinical measurements within normal limits will improve Outcome: Progressing

## 2023-12-13 NOTE — BH IP Treatment Plan (Addendum)
 Interdisciplinary Treatment and Diagnostic Plan Update  12/13/2023 Time of Session: 14:00 Larry Simmons MRN: [id number]  Principal Diagnosis: Schizoaffective disorder [facility])  Secondary Diagnoses: Principal Problem:   Schizoaffective disorder (HCC)   Current Medications:  Current Facility-Administered Medications  Medication Dose Route Frequency Provider Last Rate Last Admin   acetaminophen  (TYLENOL ) tablet 650 mg  650 mg Oral Q6H PRN White, Patrice L, NP       albuterol  (VENTOLIN  HFA) 108 (90 Base) MCG/ACT inhaler 1-2 puff  1-2 puff Inhalation Q6H PRN White, Patrice L, NP       alum & mag hydroxide-simeth (MAALOX/MYLANTA) 200-200-20 MG/5ML suspension 30 mL  30 mL Oral Q4H PRN White, Patrice L, NP       benztropine  (COGENTIN ) tablet 0.5 mg  0.5 mg Oral BID White, Patrice L, NP   0.5 mg at 12/13/23 0804   divalproex  (DEPAKOTE ) DR tablet 500 mg  500 mg Oral BID White, Patrice L, NP   500 mg at 12/13/23 9195   haloperidol  (HALDOL ) tablet 5 mg  5 mg Oral BID White, Patrice L, NP   5 mg at 12/13/23 9195   hydrOXYzine  (ATARAX ) tablet 25 mg  25 mg Oral TID PRN White, Patrice L, NP   25 mg at 12/10/23 2129   magnesium  hydroxide (MILK OF MAGNESIA) suspension 30 mL  30 mL Oral Daily PRN White, Patrice L, NP   30 mL at 12/12/23 0813   OLANZapine  (ZYPREXA ) injection 10 mg  10 mg Intramuscular TID PRN White, Patrice L, NP       OLANZapine  (ZYPREXA ) injection 5 mg  5 mg Intramuscular TID PRN White, Patrice L, NP       OLANZapine  zydis (ZYPREXA ) disintegrating tablet 5 mg  5 mg Oral TID PRN White, Patrice L, NP       traZODone  (DESYREL ) tablet 50 mg  50 mg Oral QHS PRN White, Patrice L, NP   50 mg at 12/11/23 2118   PTA Medications: Medications Prior to Admission  Medication Sig Dispense Refill Last Dose/Taking   albuterol  (VENTOLIN  HFA) 108 (90 Base) MCG/ACT inhaler Inhale 1-2 puffs into the lungs every 6 (six) hours as needed for wheezing or shortness of breath. (Patient not taking: Reported on  12/06/2023) 6.7 g 3    haloperidol  (HALDOL ) 5 MG tablet Take 1 tablet (5 mg total) by mouth 2 (two) times daily.      paliperidone  (INVEGA  SUSTENNA) 156 MG/ML SUSY injection Inject 1 mL (156 mg total) into the muscle every 28 (twenty-eight) days. 1 mL 11    [DISCONTINUED] benztropine  (COGENTIN ) 0.5 MG tablet Take 1 tablet (0.5 mg total) by mouth 2 (two) times daily. 60 tablet 3    [DISCONTINUED] divalproex  (DEPAKOTE ) 500 MG DR tablet Take 1 tablet (500 mg total) by mouth 2 (two) times daily. 60 tablet 3    [DISCONTINUED] traZODone  (DESYREL ) 50 MG tablet Take 1 tablet (50 mg total) by mouth at bedtime as needed for sleep. (Patient taking differently: Take 50 mg by mouth at bedtime.) 30 tablet 3     Patient Stressors: Loss of relationship    Patient Strengths: Forensic psychologist fund of knowledge   Treatment Modalities: Medication Management, Group therapy, Case management,  1 to 1 session with clinician, Psychoeducation, Recreational therapy.   Physician Treatment Plan for Primary Diagnosis: Schizoaffective disorder [facility]) Long Term Goal(s): Improvement in symptoms so as ready for discharge relationship help.   Short Term Goals: Ability to disclose and discuss suicidal ideas Ability to identify  and develop effective coping behaviors will improve Ability to identify triggers associated with substance abuse/mental health issues will improve  Medication Management: Evaluate patient's response, side effects, and tolerance of medication regimen.  Therapeutic Interventions: 1 to 1 sessions, Unit Group sessions and Medication administration.  Evaluation of Outcomes: Progressing  Physician Treatment Plan for Secondary Diagnosis: Principal Problem:   Schizoaffective disorder (HCC)  Long Term Goal(s): Improvement in symptoms so as ready for discharge relationship help.   Short Term Goals: Ability to disclose and discuss suicidal ideas Ability to identify and develop effective  coping behaviors will improve Ability to identify triggers associated with substance abuse/mental health issues will improve     Medication Management: Evaluate patient's response, side effects, and tolerance of medication regimen.  Therapeutic Interventions: 1 to 1 sessions, Unit Group sessions and Medication administration.  Evaluation of Outcomes: Progressing   RN Treatment Plan for Primary Diagnosis: Schizoaffective disorder (HCC) Long Term Goal(s): Knowledge of disease and therapeutic regimen to maintain health will improve  Short Term Goals: Ability to remain free from injury will improve, Ability to verbalize frustration and anger appropriately will improve, Ability to demonstrate self-control, Ability to participate in decision making will improve, Ability to verbalize feelings will improve, Ability to disclose and discuss suicidal ideas, Ability to identify and develop effective coping behaviors will improve, and Compliance with prescribed medications will improve  Medication Management: RN will administer medications as ordered by provider, will assess and evaluate patient's response and provide education to patient for prescribed medication. RN will report any adverse and/or side effects to prescribing provider.  Therapeutic Interventions: 1 on 1 counseling sessions, Psychoeducation, Medication administration, Evaluate responses to treatment, Monitor vital signs and CBGs as ordered, Perform/monitor CIWA, COWS, AIMS and Fall Risk screenings as ordered, Perform wound care treatments as ordered.  Evaluation of Outcomes: Progressing   LCSW Treatment Plan for Primary Diagnosis: Schizoaffective disorder (HCC) Long Term Goal(s): Safe transition to appropriate next level of care at discharge, Engage patient in therapeutic group addressing interpersonal concerns.  Short Term Goals: Engage patient in aftercare planning with referrals and resources, Increase social support, Increase ability  to appropriately verbalize feelings, Increase emotional regulation, Facilitate acceptance of mental health diagnosis and concerns, Facilitate patient progression through stages of change regarding substance use diagnoses and concerns, Identify triggers associated with mental health/substance abuse issues, and Increase skills for wellness and recovery  Therapeutic Interventions: Assess for all discharge needs, 1 to 1 time with Social worker, Explore available resources and support systems, Assess for adequacy in community support network, Educate family and significant other(s) on suicide prevention, Complete Psychosocial Assessment, Interpersonal group therapy.  Evaluation of Outcomes: Progressing   Progress in Treatment: Attending groups: Yes. Participating in groups: Yes. Taking medication as prescribed: Yes. Toleration medication: Yes. Family/Significant other contact made: Yes, individual(s) contacted:  mother, Kolbi Altadonna.  Patient understands diagnosis: Yes. Discussing patient identified problems/goals with staff: Yes. Medical problems stabilized or resolved: Yes. Denies suicidal/homicidal ideation: Yes. Issues/concerns per patient self-inventory: No. Other: none.   New problem(s) identified: No, Describe:  none identified.   New Short Term/Long Term Goal(s): elimination of symptoms of psychosis, medication management for mood stabilization; elimination of SI thoughts; development of comprehensive mental wellness plan. Update 12/13/23: No changes at this time.    Patient Goals:  I just want a partner and I just want to be happy again. Update 12/13/23: No changes at this time.   Discharge Plan or Barriers: CSW will assist pt with development of an appropriate  aftercare/discharge plan. Update 12/13/23: Pt to return to [facility] program upon discharge and has follow up care through [facility] outpatient program.    Reason for Continuation  of Hospitalization: Hallucinations Homicidal ideation Medication stabilization Suicidal ideation   Estimated Length of Stay: 1-7 days. Update 12/13/23: No changes at this time.  Last 3 [place] Suicide Severity Risk Score: Flowsheet Row Admission (Current) from 12/06/2023 in [facility] INPATIENT BEHAVIORAL MEDICINE Most recent reading at 12/06/2023 11:00 PM ED from 12/06/2023 in [facility] Most recent reading at 12/06/2023  2:00 PM Clinical Support from 11/18/2023 in [facility] Most recent reading at 11/18/2023 10:53 AM  C-SSRS RISK CATEGORY Moderate Risk Moderate Risk No Risk    Last PHQ 2/9 Scores:    11/18/2023   10:53 AM 09/16/2023   11:02 AM 07/22/2023    9:03 AM  Depression screen PHQ 2/9  Decreased Interest 0 0   Down, Depressed, Hopeless 0 0 0  PHQ - 2 Score 0 0 0  Altered sleeping   0  Tired, decreased energy   1  Change in appetite   1  Feeling bad or failure about yourself    0  Trouble concentrating   0  Moving slowly or fidgety/restless   1  Suicidal thoughts   0  PHQ-9 Score   3  Difficult doing work/chores   Not difficult at all    Scribe for Treatment Team: Nadara JONELLE Fam, LCSW 12/13/2023 3:52 PM

## 2023-12-14 LAB — VALPROIC ACID LEVEL: Valproic Acid Lvl: 86 ug/mL (ref 50–100)

## 2023-12-14 NOTE — Discharge Summary (Signed)
 Physician Discharge Summary Note  Patient:  Larry Simmons is an 33 y.o., male MRN:  [id number] DOB:  05-25-1990 Patient phone:  [phone] (home)  Patient address:   [address] [place] [address],   Total time spent: 40 min Date of Admission:  12/06/2023 Date of Discharge: 12/14/23  Reason for Admission:   The patient is a 33 year old male with a psychiatric history of schizoaffective disorder, bipolar type, and pertinent medical history of asthma and eczema. He presented to [facility] on 12/06/23 from the [facility] after self-proclaimed "nervous breakdown" following conflict regarding his ex-fiance. He endorsed suicidal ideation with plan to cut his throat with a knife, homicidal ideation toward his ex-girlfriend, and intrusive thoughts involving God, the devil, and feelings of invincibility. He also endorsed visual hallucinations of shadows, auditory hallucinations of male and male voices, and ideas of reference with special messages. He reported medication nonadherence prior to admission, inconsistent statements about compliance, and recent receipt of Invega  Sustenna injection on 11/25/23. Given the severity of suicidal and homicidal thoughts, psychotic symptoms, and safety concerns, he was voluntarily admitted to the inpatient psychiatric unit for stabilization, safety, and medication management.  Principal Problem: Schizoaffective disorder [facility]) Discharge Diagnoses: Principal Problem:   Schizoaffective disorder (HCC)   Past Psychiatric History: See H&P  Family Psychiatric  History: See H&P Social History:  Social History   Substance and Sexual Activity  Alcohol Use Not Currently   Comment: occassionl     Social History   Substance and Sexual Activity  Drug Use Not Currently   Types: Marijuana   Comment: Last used 04/2022    Social History   Socioeconomic History   Marital status: Single    Spouse name: Not  on file   Number of children: 0   Years of education: Not on file   Highest education level: High school graduate  Occupational History   Occupation: Social research officer, government     Comment: Museum/gallery curator  Tobacco Use   Smoking status: Every Day    Current packs/day: 0.50    Average packs/day: 0.5 packs/day for 13.8 years (6.9 ttl pk-yrs)    Types: Cigarettes    Start date: 03/02/2010   Smokeless tobacco: Never   Tobacco comments:    Reports down to 2 a day and trying to quit   Vaping Use   Vaping status: Never Used  Substance and Sexual Activity   Alcohol use: Not Currently    Comment: occassionl   Drug use: Not Currently    Types: Marijuana    Comment: Last used 04/2022   Sexual activity: Not Currently    Comment: Last sexual encounter 04/2022  Other Topics Concern   Not on file  Social History Narrative   Not on file   Social Drivers of Health   Financial Resource Strain: Not on file  Food Insecurity: No Food Insecurity (12/06/2023)   Hunger Vital Sign    Worried About Running Out of Food in the Last Year: Never true    Ran Out of Food in the Last Year: Never true  Transportation Needs: No Transportation Needs (12/06/2023)   PRAPARE - Administrator, Civil Service (Medical): No    Lack of Transportation (Non-Medical): No  Physical Activity: Not on file  Stress: Not on file  Social Connections: Socially Isolated (12/06/2023)   Social Connection and Isolation Panel    Frequency of Communication with Friends and Family: Once a week  Frequency of Social Gatherings with Friends and Family: Never    Attends Religious Services: Never    Database administrator or Organizations: No    Attends Engineer, structural: Never    Marital Status: Separated   Past Medical History:  Past Medical History:  Diagnosis Date   Asthma    Eczema    Mood swings    History reviewed. No pertinent surgical history. Family History: History reviewed. No pertinent family  history.  Hospital Course:  Upon admission, the patient was placed on standard precautions. He was restarted on his outpatient regimen including Haldol  5 mg twice daily, Depakote  500 mg twice daily with therapeutic levels obtained, and Cogentin  0.5 mg twice daily, in addition to his recent Invega  Sustenna long-acting injection. Trazodone  was available as needed for insomnia. He tolerated medications without reported side effects, and metabolic profile and EKG monitoring were obtained (QTc 441). He participated in group therapy and unit milieu as tolerated.  In the early days of admission, he endorsed residual psychotic symptoms and elevated anxiety. Over the course of treatment, he demonstrated progressive stabilization, consistently denying suicidal or homicidal ideation, auditory or visual hallucinations, or paranoid thoughts. Nursing documentation noted good participation in groups, compliance with medications, and no behavioral concerns. By 9/19, he reported significant improvement and described feeling "a lot better being in the hospital." By 9/20-9/21, he continued to deny SI, HI, or psychotic symptoms, and expressed readiness for discharge. He was pleasant, cooperative, and future oriented on exam.  On 9/22, he confirmed ongoing medication tolerance, denied psychiatric symptoms, and demonstrated good insight into the need for continued outpatient follow-up. Social work coordinated safe discharge planning with return to [employer] [facility], and taxi voucher transportation was arranged. Safety planning and suicide prevention education were completed with his mother, Jian Hodgman, identified as primary support. Family was engaged in Aeronautical engineer.   On the day of discharge, the patient was alert and oriented. He was linear, logical, and future oriented, able to identify coping skills, supports, and crisis resources. He denied suicidal ideation, homicidal ideation, and auditory or visual  hallucinations.  Detailed risk assessment was completed based on clinical exam and review of risk factors, with acute suicide risk assessed as low and acute violence risk assessed as low. All modifiable risks of harm to self or others were addressed during admission. The patient is no longer appropriate for the acute inpatient setting and is safe to continue treatment for ongoing mental health needs in the community. He was educated on his discharge plan of care, including medications, follow-up appointments, available crisis services, and emergency procedures. He verbalized understanding and agreement with this plan. He was instructed to call 911 or present to the nearest emergency department should suicidal or homicidal ideation return or if his psychiatric symptoms worsen.  Physical Findings: AIMS:  , ,  ,  ,    CIWA:    COWS:        Psychiatric Specialty Exam:  Presentation  General Appearance:  Casual  Eye Contact: Fair  Speech: Clear and Coherent  Speech Volume: Normal    Mood and Affect  Mood: Euthymic  Affect: Congruent   Thought Process  Thought Processes: Coherent  Descriptions of Associations:Intact  Orientation:Full (Time, Place and Person)  Thought Content:WDL  Hallucinations:No data recorded  Ideas of Reference:None  Suicidal Thoughts:No data recorded  Homicidal Thoughts:No data recorded   Sensorium  Memory: Immediate Fair; Recent Fair  Judgment: Fair  Insight: Curator  Functions  Concentration: Fair  Attention Span: Fair  Recall: [employer] of Knowledge: Fair  Language: Fair   Psychomotor Activity  Psychomotor Activity: No data recorded  Musculoskeletal: Strength & Muscle Tone: within normal limits Gait & Station: normal Assets  Assets: Manufacturing systems engineer; Desire for Improvement   Sleep  Sleep: No data recorded    Physical Exam: Physical Exam ROS Blood pressure 108/61, pulse (!) 57,  temperature (!) 97.3 F (36.3 C), resp. rate 18, height 6' 1 (1.854 m), weight 88.7 kg, SpO2 100%. Body mass index is 25.81 kg/m.   Social History   Tobacco Use  Smoking Status Every Day   Current packs/day: 0.50   Average packs/day: 0.5 packs/day for 13.8 years (6.9 ttl pk-yrs)   Types: Cigarettes   Start date: 03/02/2010  Smokeless Tobacco Never  Tobacco Comments   Reports down to 2 a day and trying to quit    Tobacco Cessation:  A prescription for an FDA-approved tobacco cessation medication was offered at discharge and the patient refused   Blood Alcohol level:  Lab Results  Component Value Date   [facility] <15 12/06/2023   ETH <10 06/10/2023    Metabolic Disorder Labs:  Lab Results  Component Value Date   HGBA1C 4.9 12/06/2023   MPG 93.93 12/06/2023   MPG 105.41 06/10/2023   Lab Results  Component Value Date   PROLACTIN 20.2 01/20/2023   PROLACTIN 3.2 (L) 09/08/2021   Lab Results  Component Value Date   CHOL 169 12/06/2023   TRIG 34 12/06/2023   HDL 78 12/06/2023   CHOLHDL 2.2 12/06/2023   VLDL 7 12/06/2023   LDLCALC 84 12/06/2023   LDLCALC 74 06/10/2023    See Psychiatric Specialty Exam and Suicide Risk Assessment completed by Attending Physician prior to discharge.  Discharge destination:  Home  Is patient on multiple antipsychotic therapies at discharge:  No   Has Patient had three or more failed trials of antipsychotic monotherapy by history:  No  Recommended Plan for Multiple Antipsychotic Therapies: NA  Discharge Instructions     Diet - low sodium heart healthy   Complete by: As directed    Increase activity slowly   Complete by: As directed       Allergies as of 12/14/2023       Reactions   Iodinated Contrast Media Other (See Comments)   Irritated skin   Shellfish Allergy Swelling   Peanut-containing Drug Products Swelling        Medication List     TAKE these medications      Indication  albuterol  108 (90 Base) MCG/ACT  inhaler Commonly known as: VENTOLIN  HFA Inhale 1-2 puffs into the lungs every 6 (six) hours as needed for wheezing or shortness of breath.  Indication: Asthma, Spasm of Lung Air Passages   benztropine  0.5 MG tablet Commonly known as: COGENTIN  Take 1 tablet (0.5 mg total) by mouth 2 (two) times daily.  Indication: EPS prophylaxis   divalproex  500 MG DR tablet Commonly known as: DEPAKOTE  Take 1 tablet (500 mg total) by mouth 2 (two) times daily.  Indication: Schizophrenia   haloperidol  5 MG tablet Commonly known as: HALDOL  Take 1 tablet (5 mg total) by mouth 2 (two) times daily.  Indication: schizoaffective disorder   Invega  Sustenna 156 MG/ML Susy injection Generic drug: paliperidone  Inject 1 mL (156 mg total) into the muscle every 28 (twenty-eight) days.  Indication: Schizoaffective Disorder   traZODone  50 MG tablet Commonly known as: DESYREL  Take 1 tablet (  50 mg total) by mouth at bedtime as needed for sleep. What changed:  when to take this reasons to take this  Indication: Trouble Sleeping        Follow-up Information     [facility] Follow up.   Specialty: Urgent Care Why: You have appointments scheduled for Thursday, 12/23/23 at 9:00 and 9:30AM. Contact information: [address] [place]  [address] [phone]                Follow-up recommendations:   # It is recommended to the patient to continue psychiatric medications as prescribed, after discharge from the hospital.   # It is recommended to the patient to follow up with your outpatient psychiatric provider and PCP. # It was discussed with the patient, the impact of alcohol, drugs, tobacco have been there overall psychiatric and medical wellbeing, and total abstinence from substance use was recommended. # Prescriptions provided or sent directly to preferred pharmacy at discharge. Patient agreeable to plan. Given the opportunity to ask questions. Appears to  feel comfortable with discharge.  # In the event of worsening symptoms, the patient is instructed to call the crisis hotline (988), 911 and or go to the nearest ED for appropriate evaluation and treatment of symptoms. To follow-up with primary care provider for other medical issues, concerns and or health care needs # Patient was discharged home [facility] house) as requested with a plan to follow up as noted above.      Signed: Donnice FORBES Right, PA-C 12/16/2023, 2:52 PM

## 2023-12-14 NOTE — Plan of Care (Signed)
   Problem: Education: Goal: Emotional status will improve Outcome: Progressing Goal: Mental status will improve Outcome: Progressing

## 2023-12-14 NOTE — Group Note (Signed)
 Recreation Therapy Group Note   Group Topic:Health and Wellness  Group Date: 12/14/2023 Start Time: 1000 End Time: 1100 Facilitators: Celestia Jeoffrey BRAVO, LRT, CTRS Location: Courtyard  Group Description: [employer]. LRT and patients played games of basketball, drew with chalk, and played corn hole while outside in the courtyard while getting fresh air and sunlight. Music was being played in the background. LRT and peers conversed about different games they have played before, what they do in their free time and anything else that is on their minds. LRT encouraged pts to drink water after being outside, sweating and getting their heart rate up.  Goal Area(s) Addressed: Patient will build on frustration tolerance skills. Patients will partake in a competitive play game with peers. Patients will gain knowledge of new leisure interest/hobby.   Affect/Mood: N/A   Participation Level: Did not attend    Clinical Observations/Individualized Feedback: Patient did not attend group.   Plan: Continue to engage patient in RT group sessions 2-3x/week.   Jeoffrey BRAVO Celestia, LRT, CTRS 12/14/2023 11:24 AM

## 2023-12-14 NOTE — [facility] Suicide Risk Assessment (Signed)
 [facility] Discharge Suicide Risk Assessment   Principal Problem: Schizoaffective disorder [facility]) Discharge Diagnoses: Principal Problem:   Schizoaffective disorder (HCC)   Total Time spent with patient: 30 minutes  Musculoskeletal: Strength & Muscle Tone: within normal limits Gait & Station: normal Patient leans: N/A  Psychiatric Specialty Exam  Presentation  General Appearance:  Casual  Eye Contact: Fair  Speech: Clear and Coherent  Speech Volume: Normal  Handedness: Right   Mood and Affect  Mood: Euthymic  Duration of Depression Symptoms: Greater than two weeks  Affect: Congruent   Thought Process  Thought Processes: Coherent  Descriptions of Associations:Intact  Orientation:Full (Time, Place and Person)  Thought Content:WDL  History of Schizophrenia/Schizoaffective disorder:No  Duration of Psychotic Symptoms:N/A  Hallucinations:Hallucinations: None  Ideas of Reference:None  Suicidal Thoughts:Suicidal Thoughts: No  Homicidal Thoughts:Homicidal Thoughts: No   Sensorium  Memory: Immediate Fair; Recent Fair  Judgment: Fair  Insight: Fair   Art therapist  Concentration: Fair  Attention Span: Fair  Recall: [employer] of Knowledge: Fair  Language: Fair   Psychomotor Activity  Psychomotor Activity: Psychomotor Activity: Normal   Assets  Assets: Communication Skills; Desire for Improvement   Sleep  Sleep: Sleep: Fair  Estimated Sleeping Duration (Last 24 Hours): 8.75-11.00 hours  Physical Exam: Physical Exam Vitals and nursing note reviewed.  HENT:     Head: Atraumatic.  Eyes:     Extraocular Movements: Extraocular movements intact.  Pulmonary:     Effort: Pulmonary effort is normal.  Neurological:     Mental Status: He is alert and oriented to person, place, and time.    ROS Blood pressure 108/61, pulse (!) 57, temperature (!) 97.3 F (36.3 C), resp. rate 18, height 6' 1 (1.854 m), weight 88.7  kg, SpO2 100%. Body mass index is 25.81 kg/m.  Mental Status Per Nursing Assessment::   On Admission:  Suicidal ideation indicated by others  Demographic Factors:  Male and Unemployed  Loss Factors: Relationship stress  Historical Factors: Impulsivity  Risk Reduction Factors:   Religious beliefs about death, Positive social support, and Positive coping skills or problem solving skills  Continued Clinical Symptoms:  Previous Psychiatric Diagnoses and Treatments  Cognitive Features That Contribute To Risk:  Loss of executive function    Suicide Risk:  Minimal: No identifiable suicidal ideation.  Patients presenting with no risk factors but with morbid ruminations; may be classified as minimal risk based on the severity of the depressive symptoms   Follow-up Information     [facility] Follow up.   Specialty: Urgent Care Why: You have appointments scheduled for Thursday, 12/23/23 at 9:00 and 9:30AM. Contact information: [address] [place]  [address] [phone]                Plan Of Care/Follow-up recommendations:  # It is recommended to the patient to continue psychiatric medications as prescribed, after discharge from the hospital.   # It is recommended to the patient to follow up with your outpatient psychiatric provider and PCP. # It was discussed with the patient, the impact of alcohol, drugs, tobacco have been there overall psychiatric and medical wellbeing, and total abstinence from substance use was recommended. # Prescriptions provided or sent directly to preferred pharmacy at discharge. Patient agreeable to plan. Given the opportunity to ask questions. Appears to feel comfortable with discharge.  # In the event of worsening symptoms, the patient is instructed to call the crisis hotline (988), 911 and or go to the nearest ED  for appropriate evaluation and treatment of symptoms. To follow-up with primary care provider  for other medical issues, concerns and or health care needs # Patient was discharged home as requested with a plan to follow up as noted above.    Donnice FORBES Right, PA-C 12/14/2023, 9:33 AM

## 2023-12-14 NOTE — Progress Notes (Signed)
 Patient pleasant and cooperative on approach. Denies SI,HI and AVH. Verbalized understanding discharge instructions,prescriptions and follow up care.  All belongings returned from [employer]. Suicide safety plan filled by patient and placed in chart. Copy given to patient.Patient escorted out by staff and transported by family.

## 2023-12-14 NOTE — Progress Notes (Signed)
  [facility] Adult Case Management Discharge Plan :  Will you be returning to the same living situation after discharge:  Yes,  pt plans to return to [facility].  At discharge, do you have transportation home?: Yes,  pt received taxi voucher.  Do you have the ability to pay for your medications: Yes,  [employer]   Release of information consent forms completed and in the chart;  Patient's signature needed at discharge.  Patient to Follow up at:  Follow-up Information     [facility] Follow up.   Specialty: Urgent Care Why: You have appointments scheduled for Thursday, 12/23/23 at 9:00 and 9:30AM. Contact information: [address] [place]  [address] [phone]                Next level of care provider has access to [facility] Link:yes  Safety Planning and Suicide Prevention discussed: Yes,  SPE completed with mother, Thelton Graca.      Has patient been referred to the Quitline?: Patient refused referral for treatment  Patient has been referred for addiction treatment: Yes, the patient will follow up with an outpatient provider for substance use disorder. Psychiatrist/APP: appointment made and Therapist: appointment made  Nadara JONELLE Fam, LCSW 12/14/2023, 10:25 AM

## 2023-12-23 ENCOUNTER — Encounter (HOSPITAL_COMMUNITY): Payer: Self-pay | Admitting: Psychiatry

## 2023-12-23 ENCOUNTER — Ambulatory Visit (INDEPENDENT_AMBULATORY_CARE_PROVIDER_SITE_OTHER): Payer: MEDICAID | Admitting: Psychiatry

## 2023-12-23 ENCOUNTER — Ambulatory Visit (INDEPENDENT_AMBULATORY_CARE_PROVIDER_SITE_OTHER): Payer: MEDICAID

## 2023-12-23 VITALS — BP 113/65 | HR 72 | Resp 16 | Ht 73.0 in | Wt 201.2 lb

## 2023-12-23 DIAGNOSIS — F250 Schizoaffective disorder, bipolar type: Secondary | ICD-10-CM

## 2023-12-23 DIAGNOSIS — F200 Paranoid schizophrenia: Secondary | ICD-10-CM | POA: Diagnosis not present

## 2023-12-23 MED ORDER — PALIPERIDONE PALMITATE ER 156 MG/ML IM SUSY
156.0000 mg | PREFILLED_SYRINGE | INTRAMUSCULAR | Status: AC
  Administered 2023-12-23 – 2024-04-19 (×5): 156 mg via INTRAMUSCULAR

## 2023-12-23 MED ORDER — TRAZODONE HCL 50 MG PO TABS: 50.0000 mg | ORAL_TABLET | Freq: Every evening | ORAL | 3 refills | Status: DC | PRN

## 2023-12-23 MED ORDER — DIVALPROEX SODIUM 500 MG PO DR TAB: 500.0000 mg | DELAYED_RELEASE_TABLET | Freq: Two times a day (BID) | ORAL | 3 refills | Status: DC

## 2023-12-23 MED ORDER — INVEGA SUSTENNA 156 MG/ML IM SUSY: 156.0000 mg | PREFILLED_SYRINGE | INTRAMUSCULAR | 11 refills | Status: DC

## 2023-12-23 MED ORDER — BENZTROPINE MESYLATE 0.5 MG PO TABS: 0.5000 mg | ORAL_TABLET | Freq: Two times a day (BID) | ORAL | 3 refills | Status: DC

## 2023-12-23 MED ORDER — HALOPERIDOL 5 MG PO TABS: 5.0000 mg | ORAL_TABLET | Freq: Two times a day (BID) | ORAL | 3 refills | Status: DC

## 2023-12-23 NOTE — Progress Notes (Cosign Needed)
 Patient presented to office for injection of Invega  sustenna 156 mg. Prior to injection, patient completed an office visit with provider. Patient tolerated injection well. Injection was given in patient's left deltoid. Patient will return in 28 days.
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
## 2023-12-29 ENCOUNTER — Encounter (HOSPITAL_COMMUNITY): Payer: Self-pay

## 2023-12-29 ENCOUNTER — Ambulatory Visit (HOSPITAL_COMMUNITY)
Admission: EM | Admit: 2023-12-29 | Discharge: 2023-12-29 | Disposition: A | Discharge status: Home / Self Care | Payer: MEDICAID | Attending: Family Medicine | Admitting: Family Medicine

## 2023-12-29 DIAGNOSIS — T4995XA Adverse effect of unspecified topical agent, initial encounter: Secondary | ICD-10-CM | POA: Diagnosis not present

## 2023-12-29 DIAGNOSIS — R238 Other skin changes: Secondary | ICD-10-CM | POA: Diagnosis not present

## 2023-12-29 DIAGNOSIS — L309 Dermatitis, unspecified: Secondary | ICD-10-CM | POA: Diagnosis not present

## 2023-12-29 MED ORDER — TRIAMCINOLONE ACETONIDE 0.1 % EX CREA: 1.0000 | TOPICAL_CREAM | Freq: Two times a day (BID) | CUTANEOUS | 0 refills | Status: AC

## 2023-12-29 MED ORDER — CEPHALEXIN 500 MG PO CAPS: 500.0000 mg | ORAL_CAPSULE | Freq: Three times a day (TID) | ORAL | 0 refills | Status: AC

## 2023-12-29 NOTE — ED Triage Notes (Signed)
 Patient reports that he has open areas to bilateral hands since yesterday. Patient states he was using cleaning chemicals at work, but does not know the name.  Patient states he has been putting Neosporin to the areas.

## 2023-12-29 NOTE — ED Provider Notes (Signed)
 [facility]    CSN: [id number] Arrival date & time: 12/29/23  1118      History   Chief Complaint Chief Complaint  Patient presents with   skin irritation    HPI Larry Simmons is a 33 y.o. male.   Patient is here for skin/hand irritation.  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  He noted burning to both hands when he removed his hands.  His hands have continued to burn with movement of his hands.  He has been using neosporin with little help.  He does have eczema on his hands;  uses otc meds for this.  He has hypopigmentation to his hands from the eczema.         Past Medical History:  Diagnosis Date   Asthma    Eczema    Mood swings     Patient Active Problem List   Diagnosis Date Noted   Schizoaffective disorder (HCC) 12/06/2023   Schizoaffective disorder, bipolar type (HCC) 01/21/2023   Drug-induced erectile dysfunction 08/06/2022   Well adult exam 08/06/2022   Asthma    Eczema    Schizophrenia spectrum disorder with psychotic disorder type not yet determined (HCC) 09/11/2021    History reviewed. No pertinent surgical history.     Home Medications    Prior to Admission medications   Medication Sig Start Date End Date Taking? Authorizing Provider  albuterol  (VENTOLIN  HFA) 108 (90 Base) MCG/ACT inhaler Inhale 1-2 puffs into the lungs every 6 (six) hours as needed for wheezing or shortness of breath. Patient not taking: Reported on 12/06/2023 07/30/23   Reddick, Johnathan B, NP  benztropine  (COGENTIN ) 0.5 MG tablet Take 1 tablet (0.5 mg total) by mouth 2 (two) times daily. 12/23/23   Harl Zane BRAVO, NP  divalproex  (DEPAKOTE ) 500 MG DR tablet Take 1 tablet (500 mg total) by mouth 2 (two) times daily. 12/23/23   Harl Zane BRAVO, NP  haloperidol  (HALDOL ) 5 MG tablet Take 1 tablet (5 mg total) by mouth 2 (two) times daily. 12/23/23   Harl Zane BRAVO, NP  paliperidone   (INVEGA  SUSTENNA) 156 MG/ML SUSY injection Inject 1 mL (156 mg total) into the muscle every 28 (twenty-eight) days. 12/23/23   Harl Zane BRAVO, NP  traZODone  (DESYREL ) 50 MG tablet Take 1 tablet (50 mg total) by mouth at bedtime as needed for sleep. 12/23/23   Harl Zane BRAVO, NP    Family History History reviewed. No pertinent family history.  Social History Social History   Tobacco Use   Smoking status: Former    Current packs/day: 0.50    Average packs/day: 0.5 packs/day for 13.8 years (6.9 ttl pk-yrs)    Types: Cigarettes    Start date: 03/02/2010   Smokeless tobacco: Never   Tobacco comments:    Reports down to 2 a day and trying to quit   Vaping Use   Vaping status: Never Used  Substance Use Topics   Alcohol use: Not Currently    Comment: occassionl   Drug use: Not Currently    Types: Marijuana    Comment: Last used 04/2022     Allergies   Iodinated contrast media, Shellfish allergy, and Peanut-containing drug products   Review of Systems Review of Systems  Constitutional: Negative.   HENT: Negative.    Respiratory: Negative.    Cardiovascular: Negative.   Gastrointestinal: Negative.   Skin:  Positive for color change and wound.  Physical Exam Triage Vital Signs ED Triage Vitals  Encounter Vitals Group     BP 12/29/23 1245 114/77     Girls Systolic BP Percentile --      Girls Diastolic BP Percentile --      Boys Systolic BP Percentile --      Boys Diastolic BP Percentile --      Pulse Rate 12/29/23 1245 93     Resp 12/29/23 1245 14     Temp 12/29/23 1245 97.8 F (36.6 C)     Temp Source 12/29/23 1245 Oral     SpO2 12/29/23 1245 95 %     Weight --      Height --      Head Circumference --      Peak Flow --      Pain Score 12/29/23 1244 10     Pain Loc --      Pain Education --      Exclude from Growth Chart --    No data found.  Updated Vital Signs BP 114/77 (BP Location: Right Arm)   Pulse 93   Temp 97.8 F (36.6 C) (Oral)    Resp 14   SpO2 95%   Visual Acuity Right Eye Distance:   Left Eye Distance:   Bilateral Distance:    Right Eye Near:   Left Eye Near:    Bilateral Near:     Physical Exam Constitutional:      Appearance: Normal appearance. He is normal weight.  Skin:    Comments: There is hypopigmentation to the hands/fingers from chronic eczema;  The right fingers are slightly swollen, with cracked skin/knuckles to the 2nd/3rd finger;  no active bleeding or drainage noted;  pain with flexion of the fingers.  The left fingers are slightly swollen, but no cracked skin noted.   Neurological:     Mental Status: He is alert.      UC Treatments / Results  Labs (all labs ordered are listed, but only abnormal results are displayed) Labs Reviewed - No data to display  EKG   Radiology No results found.  Procedures Procedures (including critical care time)  Medications Ordered in UC Medications - No data to display  Initial Impression / Assessment and Plan / UC Course  I have reviewed the triage vital signs and the nursing notes.  Pertinent labs & imaging results that were available during my care of the patient were reviewed by me and considered in my medical decision making (see chart for details).   Final Clinical Impressions(s) / UC Diagnoses   Final diagnoses:  Eczema, unspecified type  Skin irritation due to topical agent     Discharge Instructions      You were seen today for skin changes/irritation. I have sent out an oral antibiotic and topical steroid for the eczema.  Please avoid using harsh chemicals/soaps to the hands.  I recommend you wear gloves if needed while at work.  Please if not improving or worsening.     ED Prescriptions     Medication Sig Dispense Auth. Provider   triamcinolone  cream (KENALOG ) 0.1 % Apply 1 Application topically 2 (two) times daily. 30 g Ceirra Belli, MD   cephALEXin (KEFLEX) 500 MG capsule Take 1 capsule (500 mg total) by mouth 3  (three) times daily for 7 days. 21 capsule Darral Longs, MD      PDMP not reviewed this encounter.   Darral Longs, MD 12/29/23 [phone]

## 2023-12-29 NOTE — Discharge Instructions (Signed)
 You were seen today for skin changes/irritation. I have sent out an oral antibiotic and topical steroid for the eczema.  Please avoid using harsh chemicals/soaps to the hands.  I recommend you wear gloves if needed while at work.  Please if not improving or worsening.

## 2024-01-10 ENCOUNTER — Telehealth (HOSPITAL_COMMUNITY): Payer: Self-pay

## 2024-01-10 NOTE — Telephone Encounter (Signed)
 received notice that a prior auth was not approved because pt insurance was not active. pharmacy was notified.

## 2024-01-20 ENCOUNTER — Encounter (HOSPITAL_COMMUNITY): Payer: Self-pay

## 2024-01-20 ENCOUNTER — Ambulatory Visit (INDEPENDENT_AMBULATORY_CARE_PROVIDER_SITE_OTHER): Payer: MEDICAID

## 2024-01-20 VITALS — BP 116/73 | HR 86 | Resp 16 | Ht 73.0 in | Wt 194.8 lb

## 2024-01-20 DIAGNOSIS — F250 Schizoaffective disorder, bipolar type: Secondary | ICD-10-CM | POA: Diagnosis not present

## 2024-01-20 NOTE — Progress Notes (Cosign Needed)
 Patient presented to office for injection of Invega  Sustenna 156 mg. Patient received injection in right deltoid. Patient tolerated injection well. Patient denies SI/HI/AVH Patient denies any concerns. Patient will return in 28 days. Pt left alert and ambulatory.

## 2024-02-22 ENCOUNTER — Ambulatory Visit (INDEPENDENT_AMBULATORY_CARE_PROVIDER_SITE_OTHER): Payer: MEDICAID

## 2024-02-22 ENCOUNTER — Encounter (HOSPITAL_COMMUNITY): Payer: Self-pay

## 2024-02-22 VITALS — BP 129/70 | HR 90 | Resp 17 | Ht 73.0 in | Wt 199.0 lb

## 2024-02-22 DIAGNOSIS — F250 Schizoaffective disorder, bipolar type: Secondary | ICD-10-CM

## 2024-02-22 NOTE — Progress Notes (Cosign Needed)
 Patient presented to office for injection of Invega  Sustenna 156 mg. Patient received injection in Left deltoid . Patient tolerated injection well. Patient denies SI/HI/AVH. Patient denies any concerns. Patient will return in 28 days.Patient left alert and ambulatory.

## 2024-02-25 ENCOUNTER — Encounter (HOSPITAL_COMMUNITY): Payer: Self-pay

## 2024-02-25 ENCOUNTER — Ambulatory Visit (HOSPITAL_COMMUNITY)
Admission: EM | Admit: 2024-02-25 | Discharge: 2024-02-25 | Disposition: A | Discharge status: Home / Self Care | Payer: MEDICAID | Attending: Emergency Medicine | Admitting: Emergency Medicine

## 2024-02-25 DIAGNOSIS — J069 Acute upper respiratory infection, unspecified: Secondary | ICD-10-CM

## 2024-02-25 MED ORDER — PREDNISONE 10 MG (21) PO TBPK: ORAL_TABLET | Freq: Every day | ORAL | 0 refills | Status: AC

## 2024-02-25 NOTE — Discharge Instructions (Signed)
 Symptoms are more consistent with a common cold.  Patient should take over-the-counter cough and cold medication  Take Tylenol  or Motrin  as needed for pain or fever body aches Times can persist for 5 to 7 days Stay hydrated drink plenty of fluids Use a humidifier at night to help with nasal congestion and cough

## 2024-02-25 NOTE — ED Triage Notes (Signed)
 Body pain/aches, fatigue, sneezing/cough ONSET YESTERDAY. States there are some sick people around him, no known positive testing.   Patient tried alka seltzer plus with little relief.

## 2024-02-25 NOTE — ED Provider Notes (Signed)
 [facility]    CSN: [id number] Arrival date & time: 02/25/24  1011      History   Chief Complaint Chief Complaint  Patient presents with   Cough    HPI Larry Simmons is a 33 y.o. male.   Patient presents today with cough congestion chills.  Patient states that he has been around several people has been ill recently.  Patient denies any known fevers no nausea vomiting diarrhea no chest pain.  Took Alka-Seltzer over-the-counter with minimal relief.  Symptoms began yesterday    Past Medical History:  Diagnosis Date   Asthma    Eczema    Mood swings     Patient Active Problem List   Diagnosis Date Noted   Schizoaffective disorder (HCC) 12/06/2023   Schizoaffective disorder, bipolar type (HCC) 01/21/2023   Drug-induced erectile dysfunction 08/06/2022   Well adult exam 08/06/2022   Asthma    Eczema    Schizophrenia spectrum disorder with psychotic disorder type not yet determined (HCC) 09/11/2021    History reviewed. No pertinent surgical history.     Home Medications    Prior to Admission medications   Medication Sig Start Date End Date Taking? Authorizing Provider  albuterol  (VENTOLIN  HFA) 108 (90 Base) MCG/ACT inhaler Inhale 1-2 puffs into the lungs every 6 (six) hours as needed for wheezing or shortness of breath. 07/30/23  Yes Reddick, Johnathan B, NP  benztropine  (COGENTIN ) 0.5 MG tablet Take 1 tablet (0.5 mg total) by mouth 2 (two) times daily. 12/23/23  Yes Harl Regan E, NP  divalproex  (DEPAKOTE ) 500 MG DR tablet Take 1 tablet (500 mg total) by mouth 2 (two) times daily. 12/23/23  Yes Harl Regan E, NP  haloperidol  (HALDOL ) 5 MG tablet Take 1 tablet (5 mg total) by mouth 2 (two) times daily. 12/23/23  Yes Harl Regan E, NP  paliperidone  (INVEGA  SUSTENNA) 156 MG/ML SUSY injection Inject 1 mL (156 mg total) into the muscle every 28 (twenty-eight) days. 12/23/23  Yes Harl Regan E, NP  predniSONE  (STERAPRED UNI-PAK 21 TAB) 10 MG  (21) TBPK tablet Take by mouth daily. Take 6 tabs by mouth daily  for 2 days, then 5 tabs for 2 days, then 4 tabs for 2 days, then 3 tabs for 2 days, 2 tabs for 2 days, then 1 tab by mouth daily for 2 days 02/25/24  Yes Merilee Andrea LITTIE, NP  traZODone  (DESYREL ) 50 MG tablet Take 1 tablet (50 mg total) by mouth at bedtime as needed for sleep. 12/23/23  Yes Harl Regan E, NP  triamcinolone  cream (KENALOG ) 0.1 % Apply 1 Application topically 2 (two) times daily. 12/29/23   Darral Longs, MD    Family History History reviewed. No pertinent family history.  Social History Social History   Tobacco Use   Smoking status: Former    Current packs/day: 0.50    Average packs/day: 0.5 packs/day for 14.0 years (7.0 ttl pk-yrs)    Types: Cigarettes    Start date: 03/02/2010   Smokeless tobacco: Never   Tobacco comments:    Reports down to 2 a day and trying to quit   Vaping Use   Vaping status: Never Used  Substance Use Topics   Alcohol use: Not Currently    Comment: occassionl   Drug use: Not Currently    Types: Marijuana    Comment: Last used 04/2022     Allergies   Iodinated contrast media, Shellfish allergy, and Peanut-containing drug products   Review of Systems Review  of Systems  Constitutional:  Positive for chills. Negative for fever.  HENT:  Positive for congestion, postnasal drip, rhinorrhea, sinus pressure, sinus pain and sneezing. Negative for sore throat.   Eyes: Negative.   Respiratory:  Positive for cough. Negative for shortness of breath.   Cardiovascular: Negative.   Gastrointestinal: Negative.   Genitourinary: Negative.   Neurological: Negative.      Physical Exam Triage Vital Signs ED Triage Vitals  Encounter Vitals Group     BP 02/25/24 1050 112/60     Girls Systolic BP Percentile --      Girls Diastolic BP Percentile --      Boys Systolic BP Percentile --      Boys Diastolic BP Percentile --      Pulse Rate 02/25/24 1050 100     Resp 02/25/24 1050  18     Temp 02/25/24 1050 97.9 F (36.6 C)     Temp Source 02/25/24 1050 Oral     SpO2 02/25/24 1050 95 %     Weight 02/25/24 1050 199 lb (90.3 kg)     Height 02/25/24 1050 6' 1 (1.854 m)     Head Circumference --      Peak Flow --      Pain Score 02/25/24 1049 8     Pain Loc --      Pain Education --      Exclude from Growth Chart --    No data found.  Updated Vital Signs BP 112/60 (BP Location: Right Arm)   Pulse 100   Temp 97.9 F (36.6 C) (Oral)   Resp 18   Ht 6' 1 (1.854 m)   Wt 199 lb (90.3 kg)   SpO2 95%   BMI 26.25 kg/m   Visual Acuity Right Eye Distance:   Left Eye Distance:   Bilateral Distance:    Right Eye Near:   Left Eye Near:    Bilateral Near:     Physical Exam HENT:     Right Ear: Tympanic membrane normal.     Left Ear: Tympanic membrane normal.     Nose: Congestion present.     Mouth/Throat:     Mouth: Mucous membranes are moist.  Eyes:     Pupils: Pupils are equal, round, and reactive to light.  Cardiovascular:     Rate and Rhythm: Normal rate.     Pulses: Normal pulses.  Pulmonary:     Effort: Pulmonary effort is normal.     Breath sounds: Normal breath sounds.  Abdominal:     General: Abdomen is flat.  Musculoskeletal:     Cervical back: Normal range of motion.  Neurological:     General: No focal deficit present.     Mental Status: He is alert.      UC Treatments / Results  Labs (all labs ordered are listed, but only abnormal results are displayed) Labs Reviewed - No data to display  EKG   Radiology No results found.  Procedures Procedures (including critical care time)  Medications Ordered in UC Medications - No data to display  Initial Impression / Assessment and Plan / UC Course  I have reviewed the triage vital signs and the nursing notes.  Pertinent labs & imaging results that were available during my care of the patient were reviewed by me and considered in my medical decision making (see chart for  details).     Symptoms are more consistent with a common cold.  Patient should take over-the-counter cough  and cold medication  Take Tylenol  or Motrin  as needed for pain or fever body aches Times can persist for 5 to 7 days Stay hydrated drink plenty of fluids Use a humidifier at night to help with nasal congestion and cough Final Clinical Impressions(s) / UC Diagnoses   Final diagnoses:  Viral URI with cough     Discharge Instructions      Symptoms are more consistent with a common cold.  Patient should take over-the-counter cough and cold medication  Take Tylenol  or Motrin  as needed for pain or fever body aches Times can persist for 5 to 7 days Stay hydrated drink plenty of fluids Use a humidifier at night to help with nasal congestion and cough     ED Prescriptions     Medication Sig Dispense Auth. Provider   predniSONE  (STERAPRED UNI-PAK 21 TAB) 10 MG (21) TBPK tablet Take by mouth daily. Take 6 tabs by mouth daily  for 2 days, then 5 tabs for 2 days, then 4 tabs for 2 days, then 3 tabs for 2 days, 2 tabs for 2 days, then 1 tab by mouth daily for 2 days 42 tablet Merilee Andrea CROME, NP      PDMP not reviewed this encounter.   Merilee Andrea CROME, NP 02/25/24 [phone]

## 2024-03-21 ENCOUNTER — Ambulatory Visit (INDEPENDENT_AMBULATORY_CARE_PROVIDER_SITE_OTHER): Payer: MEDICAID

## 2024-03-21 ENCOUNTER — Ambulatory Visit (HOSPITAL_COMMUNITY): Payer: MEDICAID | Admitting: Psychiatry

## 2024-03-21 ENCOUNTER — Encounter (HOSPITAL_COMMUNITY): Payer: Self-pay

## 2024-03-21 VITALS — BP 118/72 | HR 88 | Wt 193.4 lb

## 2024-03-21 DIAGNOSIS — F250 Schizoaffective disorder, bipolar type: Secondary | ICD-10-CM | POA: Diagnosis not present

## 2024-03-21 DIAGNOSIS — F200 Paranoid schizophrenia: Secondary | ICD-10-CM

## 2024-03-21 MED ORDER — TRAZODONE HCL 50 MG PO TABS: 50.0000 mg | ORAL_TABLET | Freq: Every evening | ORAL | 3 refills | Status: AC | PRN

## 2024-03-21 MED ORDER — INVEGA SUSTENNA 156 MG/ML IM SUSY: 156.0000 mg | PREFILLED_SYRINGE | INTRAMUSCULAR | 11 refills | Status: AC

## 2024-03-21 MED ORDER — BENZTROPINE MESYLATE 0.5 MG PO TABS: 0.5000 mg | ORAL_TABLET | Freq: Two times a day (BID) | ORAL | 3 refills | Status: AC

## 2024-03-21 MED ORDER — DIVALPROEX SODIUM 500 MG PO DR TAB: 500.0000 mg | DELAYED_RELEASE_TABLET | Freq: Two times a day (BID) | ORAL | 3 refills | Status: AC

## 2024-03-21 MED ORDER — HALOPERIDOL 5 MG PO TABS: 5.0000 mg | ORAL_TABLET | Freq: Two times a day (BID) | ORAL | 3 refills | Status: AC

## 2024-03-21 NOTE — Progress Notes (Signed)
 BH MD/PA/NP OP Progress Note    03/21/2024 8:58 AM Larry Simmons  MRN:  [id number]  Chief Complaint: I will graduate [name] in February   HPI: 33 year old male seen today for follow up psychiatric history of marijuana use,schizophrenia and depression. He is currently managed on trazodone  50 mg nightly as needed, Haldol  5 mg twice daily, Cogentin  0.5 mg twice daily Depakote  500 mg twice daily, and Invega  Sustenna 156 mg monthly.  Today he notes that his medications are effective for managing his psychiatric conditions.     Today patient is well groomed, pleasant, cooperative, and engaged in conversation. He informed me that he graduated from [facility] in February.  He notes that he is excited about this venture.  Patient informed clinical research associate that today he will be attending the funeral services of the Director of [facility].  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  He is also hopeful that he and his ex-girlfriend will get back together.  He notes that they are now talking as friends.  Patient notes that when he is un-medicated the relationship is difficult.  However he reports that he does not want all of his medications other than his injection.  Provider encouraged patient to continue his medications and informed him that they could be lowered or adjustment.  He endorsed understanding.  Patient had altered mental status September 2025.  Also in the past, patient has discontinued his meds when hospitalized.  Since his last visit he reports that his mood is stable and notes that he has minimal anxiety and depression.  Provider conducted a GAD-7 and patient scored a 0, at his last visit he scored a abdominal 5.  Provider also conducted a PHQ-9 and patient scored a 0, at his last visit he scored a 5.  Patient endorses  adequate sleep and appetite.  Today he denies SI/HI/AVH, mania, or paranoia.    Patient informed clinical research associate that he continues to have issues ejaculating.  He has had this issue in the past due to side effect of LAI/antipsychotic.  Provider informed patient that Haldol /Invega  could be reduced in the future.   Patient will also be referred to a PCP.  Today provider conducted the aims assessment and patient scored a 0.  Today no medication changes made.  Patient agreeable to continue medications as prescribed.  He will follow-up in 1 month received his Invega  Sustenna injection. No other concerns noted at this time.   Visit Diagnosis:    ICD-10-CM   1. Schizophrenia, paranoid (HCC)  F20.0 divalproex  (DEPAKOTE ) 500 MG DR tablet    traZODone  (DESYREL ) 50 MG tablet    benztropine  (COGENTIN ) 0.5 MG tablet    paliperidone  (INVEGA  SUSTENNA) 156 MG/ML SUSY injection              Past Psychiatric History:  Schizophrenia and depression  Past Medical History:  Past Medical History:  Diagnosis Date   Asthma    Eczema    Mood swings    History reviewed. No pertinent surgical history.  Family Psychiatric History: Mother- Schizophrenia   Family History: History reviewed. No pertinent family history.  Social History:  Social History   Socioeconomic History   Marital status: Single    Spouse name: Not on file   Number of children: 0  Years of education: Not on file   Highest education level: High school graduate  Occupational History   Occupation: Social Research Officer, [employer]     Comment: Museum/gallery Curator  Tobacco Use   Smoking status: Former    Current packs/day: 0.50    Average packs/day: 0.5 packs/day for 14.1 years (7.0 ttl pk-yrs)    Types: Cigarettes    Start date: 03/02/2010   Smokeless tobacco: Never   Tobacco comments:    Reports down to 2 a day and trying to quit   Vaping Use   Vaping status: Never Used  Substance and Sexual Activity   Alcohol use: Not Currently    Comment:  occassionl   Drug use: Not Currently    Types: Marijuana    Comment: Last used 04/2022   Sexual activity: Not Currently    Comment: Last sexual encounter 04/2022  Other Topics Concern   Not on file  Social History Narrative   Not on file   Social Drivers of Health   Tobacco Use: Medium Risk (03/21/2024)   Patient History    Smoking Tobacco Use: Former    Smokeless Tobacco Use: Never    Passive Exposure: Not on Actuary Strain: Not on file  Food Insecurity: No Food Insecurity (12/06/2023)   Epic    Worried About Radiation Protection Practitioner of Food in the Last Year: Never true    [employer] of Food in the Last Year: Never true  Transportation Needs: No Transportation Needs (12/06/2023)   Epic    Lack of Transportation (Medical): No    Lack of Transportation (Non-Medical): No  Physical Activity: Not on file  Stress: Not on file  Social Connections: Socially Isolated (12/06/2023)   Social Connection and Isolation Panel    Frequency of Communication with Friends and Family: Once a week    Frequency of Social Gatherings with Friends and Family: Never    Attends Religious Services: Never    Database Administrator or Organizations: No    Attends Banker Meetings: Never    Marital Status: Separated  Depression (PHQ2-9): Low Risk (03/21/2024)   Depression (PHQ2-9)    PHQ-2 Score: 0  Recent Concern: Depression (PHQ2-9) - Medium Risk (12/23/2023)   Depression (PHQ2-9)    PHQ-2 Score: 5  Alcohol Screen: Low Risk (12/06/2023)   Alcohol Screen    Last Alcohol Screening Score (AUDIT): 0  Housing: Low Risk (12/06/2023)   Epic    Unable to Pay for Housing in the Last Year: No    Number of Times Moved in the Last Year: 0    Homeless in the Last Year: No  Utilities: Not At Risk (12/06/2023)   Epic    Threatened with loss of utilities: No  Health Literacy: Not on file    Allergies:  Allergies  Allergen Reactions   Iodinated Contrast Media Other (See Comments)    Irritated  skin   Shellfish Allergy Swelling   Peanut-Containing Drug Products Swelling    Metabolic Disorder Labs: Lab Results  Component Value Date   HGBA1C 4.9 12/06/2023   MPG 93.93 12/06/2023   MPG 105.41 06/10/2023   Lab Results  Component Value Date   PROLACTIN 20.2 01/20/2023   PROLACTIN 3.2 (L) 09/08/2021   Lab Results  Component Value Date   CHOL 169 12/06/2023   TRIG 34 12/06/2023   HDL 78 12/06/2023   CHOLHDL 2.2 12/06/2023   VLDL 7 12/06/2023   LDLCALC 84 12/06/2023   LDLCALC 74 06/10/2023  Lab Results  Component Value Date   TSH 1.873 12/06/2023   TSH 1.783 06/10/2023    Therapeutic Level Labs: No results found for: LITHIUM Lab Results  Component Value Date   VALPROATE 86 12/14/2023   VALPROATE 88 12/10/2023   No results found for: CBMZ  Current Medications: Current Outpatient Medications  Medication Sig Dispense Refill   albuterol  (VENTOLIN  HFA) 108 (90 Base) MCG/ACT inhaler Inhale 1-2 puffs into the lungs every 6 (six) hours as needed for wheezing or shortness of breath. 6.7 g 3   benztropine  (COGENTIN ) 0.5 MG tablet Take 1 tablet (0.5 mg total) by mouth 2 (two) times daily. 60 tablet 3   divalproex  (DEPAKOTE ) 500 MG DR tablet Take 1 tablet (500 mg total) by mouth 2 (two) times daily. 60 tablet 3   haloperidol  (HALDOL ) 5 MG tablet Take 1 tablet (5 mg total) by mouth 2 (two) times daily. 60 tablet 3   paliperidone  (INVEGA  SUSTENNA) 156 MG/ML SUSY injection Inject 1 mL (156 mg total) into the muscle every 28 (twenty-eight) days. 1 mL 11   predniSONE  (STERAPRED UNI-PAK 21 TAB) 10 MG (21) TBPK tablet Take by mouth daily. Take 6 tabs by mouth daily  for 2 days, then 5 tabs for 2 days, then 4 tabs for 2 days, then 3 tabs for 2 days, 2 tabs for 2 days, then 1 tab by mouth daily for 2 days 42 tablet 0   traZODone  (DESYREL ) 50 MG tablet Take 1 tablet (50 mg total) by mouth at bedtime as needed for sleep. 30 tablet 3   triamcinolone  cream (KENALOG ) 0.1 % Apply 1  Application topically 2 (two) times daily. 30 g 0   Current Facility-Administered Medications  Medication Dose Route Frequency Provider Last Rate Last Admin   paliperidone  (INVEGA  SUSTENNA) injection 156 mg  156 mg Intramuscular Q28 days Harl Regan E, NP   156 mg at 03/21/24 [id number]     Musculoskeletal: Strength & Muscle Tone: within normal limits Gait & Station: normal Patient leans: N/A  Psychiatric Specialty Exam: Review of Systems  There were no vitals taken for this visit.There is no height or weight on file to calculate BMI.  General Appearance: Well Groomed  Eye Contact:  Good  Speech:  Clear and Coherent and Normal Rate  Volume:  Normal  Mood:  Euthymic  Affect:  Appropriate and Congruent  Thought Process:  Coherent, Goal Directed, and Linear  Orientation:  Full (Time, Place, and Person)  Thought Content: WDL and Logical   Suicidal Thoughts:  No  Homicidal Thoughts:  No  Memory:  Immediate;   Good Recent;   Good Remote;   Good  Judgement:  Good  Insight:  Good  Psychomotor Activity:  Normal  Concentration:  Concentration: Good and Attention Span: Good  Recall:  Good  Fund of Knowledge: Good  Language: Good  Akathisia:  No  Handed:  Right  AIMS (if indicated):  done, WNL, 0  Assets:  Communication Skills Desire for Improvement Financial Resources/Insurance Housing Intimacy Leisure Time Physical Health Social Support  ADL's:  Intact  Cognition: WNL  Sleep:  Good   Screenings: AIMS    Flowsheet Row Clinical Support from 12/23/2023 in [facility] Clinical Support from 11/18/2023 in [facility] Clinical Support from 09/16/2023 in [facility] Clinical Support from 02/16/2023 in [facility] Office Visit from 10/20/2022 in [facility]  AIMS Total Score 0 0 0  0 1   AUDIT    Flowsheet Row Admission (Discharged) from  12/06/2023 in [facility] INPATIENT BEH[facility]AL MEDICINE Video Visit from 01/20/2023 in [facility]  Alcohol Use Disorder Identification Test Final Score (AUDIT) 0 12 4   GAD-7    Flowsheet Row Office Visit from 03/21/2024 in [facility] Clinical Support from 12/23/2023 in [facility] Clinical Support from 11/18/2023 in [facility] Clinical Support from 09/16/2023 in [facility] Clinical Support from 07/22/2023 in [facility]  Total GAD-7 Score 0 5 0 2 1   PHQ2-9    Flowsheet Row Office Visit from 03/21/2024 in [facility] Clinical Support from 12/23/2023 in [facility] Clinical Support from 11/18/2023 in [facility] Clinical Support from 09/16/2023 in [facility] Clinical Support from 07/22/2023 in [facility]  PHQ-2 Total Score 0 2 0 0 0  PHQ-9 Total Score 0 5 -- -- 3   Flowsheet Row UC from 02/25/2024 in [facility] UC from 12/29/2023 in [facility] Clinical Support from 12/23/2023 in [facility]  C-SSRS RISK CATEGORY No Risk No Risk Error: Q3, 4, or 5 should not be populated when Q2 is No     Assessment and Plan: Patient notes that he is doing well since his hospitalization.  Patient informed clinical research associate that he continues to have issues ejaculating.  He has had this issue in the past due to side effect of LAI/antipsychotic.  Provider informed patient that Haldol /Invega  could be reduced in the future.  Patient will also be referred to a PCP.Today no medication changes made.  Patient agreeable to continue medications as prescribed  1.  Schizophrenia, paranoid (HCC)  Continue- benztropine  (COGENTIN ) 0.5 MG tablet; Take 1 tablet (0.5 mg total) by mouth 2 (two) times daily.  Dispense: 60 tablet; Refill: 3 Continue- divalproex  (DEPAKOTE ) 500 MG DR tablet; Take 1 tablet (500 mg total) by mouth 2 (two) times daily.  Dispense: 60 tablet; Refill: 3 Continue- traZODone  (DESYREL ) 50 MG tablet; Take 1 tablet (50 mg total) by mouth at bedtime as needed for sleep.  Dispense: 30 tablet; Refill: 3 Continue- paliperidone  (INVEGA  SUSTENNA) 156 MG/ML SUSY injection; Inject 1 mL (156 mg total) into the muscle every 28 (twenty-eight) days.  Dispense: 1 mL; Refill: 11   Collaboration of Care: Collaboration of Care: Other provider involved in patient's care AEB PCP  Patient/Guardian was advised Release of Information must be obtained prior to any record release in order to collaborate their care with an outside provider. Patient/Guardian was advised if they have not already done so to contact the registration department to sign all necessary forms in order for us  to release information regarding their care.   Consent: Patient/Guardian gives verbal consent for treatment and assignment of benefits for services provided during this visit. Patient/Guardian expressed understanding and agreed to proceed.   Follow up in 2.5 month Follow up in one week with shot clinic Zane FORBES Bach, NP 03/21/2024, 8:58 AM

## 2024-03-21 NOTE — Progress Notes (Signed)
 Pt presented for Injection of Invega  Sustenna 156 mg/ml in RIGHT deltoid. Pt tolerated injection well. Pt denies SI/HI/AVH. No additional concerns. Pt will return in 28  days.  CJT-CMA Student

## 2024-04-06 ENCOUNTER — Ambulatory Visit (HOSPITAL_COMMUNITY)
Admission: EM | Admit: 2024-04-06 | Discharge: 2024-04-06 | Disposition: A | Discharge status: Home / Self Care | Payer: MEDICAID

## 2024-04-06 ENCOUNTER — Encounter (HOSPITAL_COMMUNITY): Payer: Self-pay

## 2024-04-06 DIAGNOSIS — K5900 Constipation, unspecified: Secondary | ICD-10-CM | POA: Diagnosis not present

## 2024-04-06 DIAGNOSIS — J069 Acute upper respiratory infection, unspecified: Secondary | ICD-10-CM

## 2024-04-06 MED ORDER — ONDANSETRON 4 MG PO TBDP
ORAL_TABLET | ORAL | Status: AC
  Filled 2024-04-06: qty 1

## 2024-04-06 MED ORDER — ONDANSETRON 4 MG PO TBDP
4.0000 mg | ORAL_TABLET | Freq: Once | ORAL | Status: AC
  Administered 2024-04-06: 4 mg via ORAL

## 2024-04-06 MED ORDER — POLYETHYLENE GLYCOL 3350 17 GM/SCOOP PO POWD: 17.0000 g | Freq: Every day | ORAL | 0 refills | Status: AC

## 2024-04-06 MED ORDER — ONDANSETRON 4 MG PO TBDP: 4.0000 mg | ORAL_TABLET | Freq: Three times a day (TID) | ORAL | 0 refills | Status: AC | PRN

## 2024-04-06 NOTE — Discharge Instructions (Addendum)
 You have a viral illness which will improve on its own with rest, fluids, and medications to help with your symptoms.  Tylenol  and/or ibuprofen , guaifenesin (plain mucinex), and saline nasal sprays may help relieve symptoms.   Two teaspoons of honey in 1 cup of warm water every 4-6 hours may help with throat pains.  Humidifier in room at nighttime may help soothe cough (clean well daily).   You have been given a prescription for ondansetron .  Take 1 tablet every 8 hours as needed for nausea, vomiting.  You have been given a prescription for MiraLAX .  Take 17 g daily mixed in 4-8 ounces of liquid of your choice.  This will help with your constipation.  Make sure you are drinking plenty of fluids to keep yourself hydrated.  For chest pain, shortness of breath, inability to keep food or fluids down without vomiting, fever that does not respond to tylenol  or motrin , or any other severe symptoms, please go to the ER for further evaluation. Return to urgent care as needed, otherwise follow-up with PCP.

## 2024-04-06 NOTE — ED Provider Notes (Signed)
 " [facility]    CSN: [id number] Arrival date & time: 04/06/24  1415      History   Chief Complaint Chief Complaint  Patient presents with   Headache   Nasal Congestion    HPI Larry Simmons is a 34 y.o. male.   This 35 year old male is being seen for complaints of headache, nasal congestion, rhinorrhea, fatigue for 1 week.  He says today he had a weak stomach and vomited a very small amount.  That was earlier today and there has been no recurrence.  He reports he is frequently constipated.  His last bowel movement was 2 days ago and was somewhat difficult to pass.  He reports close contact with other people with similar symptoms and unknown illness.  He has taken Alka-Seltzer with little relief.  He denies dizziness.  He denies ear pain, sore throat.  He denies chest pain, cough, shortness of breath.  He denies abdominal pain, diarrhea.  He is requesting a note to be out of work for couple of days.   Headache Associated symptoms: congestion, fatigue, nausea and vomiting   Associated symptoms: no abdominal pain, no cough, no diarrhea, no dizziness, no ear pain, no fever, no myalgias and no sore throat     Past Medical History:  Diagnosis Date   Asthma    Eczema    Mood swings     Patient Active Problem List   Diagnosis Date Noted   Schizoaffective disorder (HCC) 12/06/2023   Schizoaffective disorder, bipolar type (HCC) 01/21/2023   Drug-induced erectile dysfunction 08/06/2022   Well adult exam 08/06/2022   Asthma    Eczema    Schizophrenia spectrum disorder with psychotic disorder type not yet determined (HCC) 09/11/2021    History reviewed. No pertinent surgical history.     Home Medications    Prior to Admission medications  Medication Sig Start Date End Date Taking? Authorizing Provider  ondansetron  (ZOFRAN -ODT) 4 MG disintegrating tablet Take 1 tablet (4 mg total) by mouth every 8 (eight) hours as needed for nausea or vomiting. 04/06/24  Yes  Terisha Losasso C, FNP  polyethylene glycol powder (MIRALAX ) 17 GM/SCOOP powder Take 17 g by mouth daily. Dissolve 1 capful (17g) in 4-8 ounces of liquid and take by mouth daily. 04/06/24  Yes Bassam Dresch C, FNP  albuterol  (VENTOLIN  HFA) 108 (90 Base) MCG/ACT inhaler Inhale 1-2 puffs into the lungs every 6 (six) hours as needed for wheezing or shortness of breath. 07/30/23   Reddick, Johnathan B, NP  benztropine  (COGENTIN ) 0.5 MG tablet Take 1 tablet (0.5 mg total) by mouth 2 (two) times daily. 03/21/24   Harl Zane BRAVO, NP  divalproex  (DEPAKOTE ) 500 MG DR tablet Take 1 tablet (500 mg total) by mouth 2 (two) times daily. 03/21/24   Harl Zane BRAVO, NP  haloperidol  (HALDOL ) 5 MG tablet Take 1 tablet (5 mg total) by mouth 2 (two) times daily. 03/21/24   Harl Zane BRAVO, NP  paliperidone  (INVEGA  SUSTENNA) 156 MG/ML SUSY injection Inject 1 mL (156 mg total) into the muscle every 28 (twenty-eight) days. 03/21/24   Harl Zane BRAVO, NP  predniSONE  (STERAPRED UNI-PAK 21 TAB) 10 MG (21) TBPK tablet Take by mouth daily. Take 6 tabs by mouth daily  for 2 days, then 5 tabs for 2 days, then 4 tabs for 2 days, then 3 tabs for 2 days, 2 tabs for 2 days, then 1 tab by mouth daily for 2 days 02/25/24   Merilee Andrea LITTIE, NP  traZODone  (DESYREL ) 50 MG tablet Take 1 tablet (50 mg total) by mouth at bedtime as needed for sleep. 03/21/24   Parsons, Brittney E, NP  triamcinolone  cream (KENALOG ) 0.1 % Apply 1 Application topically 2 (two) times daily. 12/29/23   Darral Longs, MD    Family History History reviewed. No pertinent family history.  Social History Social History[1]   Allergies   Iodinated contrast media, Shellfish allergy, and Peanut-containing drug products   Review of Systems Review of Systems  Constitutional:  Positive for activity change and fatigue. Negative for appetite change, chills and fever.  HENT:  Positive for congestion and rhinorrhea. Negative for ear pain and sore throat.    Respiratory:  Negative for cough and shortness of breath.   Cardiovascular:  Negative for chest pain.  Gastrointestinal:  Positive for constipation, nausea and vomiting. Negative for abdominal pain and diarrhea.  Musculoskeletal:  Negative for arthralgias and myalgias.  Skin:  Negative for color change and rash.  Neurological:  Positive for headaches. Negative for dizziness.     Physical Exam Triage Vital Signs ED Triage Vitals [04/06/24 1613]  Encounter Vitals Group     BP 126/80     Girls Systolic BP Percentile      Girls Diastolic BP Percentile      Boys Systolic BP Percentile      Boys Diastolic BP Percentile      Pulse Rate 62     Resp 16     Temp 98 F (36.7 C)     Temp Source Oral     SpO2 97 %     Weight      Height      Head Circumference      Peak Flow      Pain Score      Pain Loc      Pain Education      Exclude from Growth Chart    No data found.  Updated Vital Signs BP 126/80 (BP Location: Right Arm)   Pulse 62   Temp 98 F (36.7 C) (Oral)   Resp 16   SpO2 97%   Visual Acuity Right Eye Distance:   Left Eye Distance:   Bilateral Distance:    Right Eye Near:   Left Eye Near:    Bilateral Near:     Physical Exam Vitals and nursing note reviewed.  Constitutional:      General: He is not in acute distress.    Appearance: He is well-developed. He is not ill-appearing or toxic-appearing.     Comments: Male appearing stated age found sitting in chair in no acute distress.  HENT:     Head: Normocephalic and atraumatic.     Right Ear: Tympanic membrane and external ear normal.     Left Ear: Tympanic membrane and external ear normal.     Nose: Congestion and rhinorrhea present.     Right Turbinates: Not enlarged.     Left Turbinates: Enlarged.     Right Sinus: No maxillary sinus tenderness or frontal sinus tenderness.     Left Sinus: No maxillary sinus tenderness or frontal sinus tenderness.     Mouth/Throat:     Lips: Pink.     Mouth: Mucous  membranes are moist.     Pharynx: No pharyngeal swelling or oropharyngeal exudate.  Eyes:     Conjunctiva/sclera: Conjunctivae normal.  Cardiovascular:     Rate and Rhythm: Normal rate and regular rhythm.     Heart sounds: Normal heart sounds.  No murmur heard. Pulmonary:     Effort: Pulmonary effort is normal. No respiratory distress.     Breath sounds: Normal breath sounds.  Abdominal:     General: Bowel sounds are normal.     Palpations: Abdomen is soft.     Tenderness: There is no abdominal tenderness.  Skin:    General: Skin is warm and dry.     Capillary Refill: Capillary refill takes less than 2 seconds.  Neurological:     Mental Status: He is alert.  Psychiatric:        Mood and Affect: Mood normal.      UC Treatments / Results  Labs (all labs ordered are listed, but only abnormal results are displayed) Labs Reviewed - No data to display  EKG   Radiology No results found.  Procedures Procedures (including critical care time)  Medications Ordered in UC Medications  ondansetron  (ZOFRAN -ODT) disintegrating tablet 4 mg (4 mg Oral Given 04/06/24 1717)    Initial Impression / Assessment and Plan / UC Course  I have reviewed the triage vital signs and the nursing notes.  Pertinent labs & imaging results that were available during my care of the patient were reviewed by me and considered in my medical decision making (see chart for details).     Vitals and triage reviewed, patient is hemodynamically stable.  He is given ondansetron  in clinic.  He passed fluid challenge.  Presentation consistent with viral respiratory infection.  Low concern for bacterial sinusitis due to lack of sinus pressure, pain, and only mildly enlarged turbinate.  He is given prescription for ondansetron  and MiraLAX .  He is advised supportive care with Tylenol  and/or ibuprofen , guaifenesin, saline nasal spray.  He is advised honey water and humidifier.  Plan of care, follow-up care, return  precautions given, no questions at this time. Final Clinical Impressions(s) / UC Diagnoses   Final diagnoses:  Upper respiratory tract infection, unspecified type  Constipation, unspecified constipation type     Discharge Instructions      You have a viral illness which will improve on its own with rest, fluids, and medications to help with your symptoms.  Tylenol  and/or ibuprofen , guaifenesin (plain mucinex), and saline nasal sprays may help relieve symptoms.   Two teaspoons of honey in 1 cup of warm water every 4-6 hours may help with throat pains.  Humidifier in room at nighttime may help soothe cough (clean well daily).   You have been given a prescription for ondansetron .  Take 1 tablet every 8 hours as needed for nausea, vomiting.  You have been given a prescription for MiraLAX .  Take 17 g daily mixed in 4-8 ounces of liquid of your choice.  This will help with your constipation.  Make sure you are drinking plenty of fluids to keep yourself hydrated.  For chest pain, shortness of breath, inability to keep food or fluids down without vomiting, fever that does not respond to tylenol  or motrin , or any other severe symptoms, please go to the ER for further evaluation. Return to urgent care as needed, otherwise follow-up with PCP.       ED Prescriptions     Medication Sig Dispense Auth. Provider   ondansetron  (ZOFRAN -ODT) 4 MG disintegrating tablet Take 1 tablet (4 mg total) by mouth every 8 (eight) hours as needed for nausea or vomiting. 10 tablet Jaylise Peek C, FNP   polyethylene glycol powder (MIRALAX ) 17 GM/SCOOP powder Take 17 g by mouth daily. Dissolve 1 capful (17g) in 4-8  ounces of liquid and take by mouth daily. 238 g Takeyla Million C, FNP      PDMP not reviewed this encounter.    [1]  Social History Tobacco Use   Smoking status: Former    Current packs/day: 0.50    Average packs/day: 0.5 packs/day for 14.1 years (7.0 ttl pk-yrs)    Types: Cigarettes     Start date: 03/02/2010   Smokeless tobacco: Never   Tobacco comments:    Reports down to 2 a day and trying to quit   Vaping Use   Vaping status: Never Used  Substance Use Topics   Alcohol use: Not Currently    Comment: occassionl   Drug use: Not Currently    Types: Marijuana    Comment: Last used 04/2022     Lennice Jon BROCKS, FNP 04/06/24 1749  "

## 2024-04-06 NOTE — ED Triage Notes (Signed)
 Pt has c/ o headache, runny nose congestion, and fatigue x 1 week. Pt has been taking alka seltzer at home for the symptoms with little relief. Last dose was last night.

## 2024-04-18 ENCOUNTER — Ambulatory Visit (HOSPITAL_COMMUNITY): Payer: MEDICAID

## 2024-04-19 ENCOUNTER — Encounter (HOSPITAL_COMMUNITY): Payer: Self-pay

## 2024-04-19 ENCOUNTER — Ambulatory Visit (INDEPENDENT_AMBULATORY_CARE_PROVIDER_SITE_OTHER): Payer: MEDICAID

## 2024-04-19 VITALS — BP 113/67 | HR 61 | Temp 97.5°F | Ht 73.0 in | Wt 198.0 lb

## 2024-04-19 DIAGNOSIS — F250 Schizoaffective disorder, bipolar type: Secondary | ICD-10-CM | POA: Diagnosis not present

## 2024-04-19 NOTE — Progress Notes (Signed)
 Pt presented for Injection of Invega  156 mg   in Left Deltoid. Pt tolerated injection well. Pt denies SI/HI/AVH. No additional concerns. Pt will return in 28 days.   [id number]

## 2024-05-18 ENCOUNTER — Ambulatory Visit (HOSPITAL_COMMUNITY): Payer: MEDICAID

## 2024-06-15 ENCOUNTER — Ambulatory Visit (HOSPITAL_COMMUNITY): Payer: MEDICAID | Admitting: Psychiatry
